# Patient Record
Sex: Female | Born: 1940 | Race: White | Hispanic: No | Marital: Single | Smoking: Never smoker
Health system: Southern US, Community
[De-identification: ages and names within clinical notes are randomized; demographics above are authoritative.]

## PROBLEM LIST (undated history)

## (undated) DIAGNOSIS — E785 Hyperlipidemia, unspecified: Secondary | ICD-10-CM

## (undated) DIAGNOSIS — E119 Type 2 diabetes mellitus without complications: Secondary | ICD-10-CM

## (undated) DIAGNOSIS — L97919 Non-pressure chronic ulcer of unspecified part of right lower leg with unspecified severity: Secondary | ICD-10-CM

## (undated) DIAGNOSIS — I739 Peripheral vascular disease, unspecified: Secondary | ICD-10-CM

## (undated) DIAGNOSIS — L97929 Non-pressure chronic ulcer of unspecified part of left lower leg with unspecified severity: Secondary | ICD-10-CM

## (undated) DIAGNOSIS — I10 Essential (primary) hypertension: Secondary | ICD-10-CM

## (undated) DIAGNOSIS — J449 Chronic obstructive pulmonary disease, unspecified: Secondary | ICD-10-CM

## (undated) HISTORY — DX: Type 2 diabetes mellitus without complications: E11.9

## (undated) HISTORY — DX: Non-pressure chronic ulcer of unspecified part of left lower leg with unspecified severity: L97.919

## (undated) HISTORY — DX: Non-pressure chronic ulcer of unspecified part of left lower leg with unspecified severity: L97.929

## (undated) HISTORY — DX: Chronic obstructive pulmonary disease, unspecified: J44.9

## (undated) HISTORY — DX: Hyperlipidemia, unspecified: E78.5

## (undated) HISTORY — DX: Essential (primary) hypertension: I10

## (undated) HISTORY — DX: Peripheral vascular disease, unspecified: I73.9

---

## 1991-12-06 HISTORY — PX: CHOLECYSTECTOMY: SHX55

## 2004-12-13 ENCOUNTER — Ambulatory Visit: Payer: Self-pay

## 2005-03-23 ENCOUNTER — Ambulatory Visit: Payer: Self-pay

## 2005-12-09 ENCOUNTER — Ambulatory Visit: Payer: Self-pay | Admitting: Internal Medicine

## 2006-07-31 ENCOUNTER — Ambulatory Visit: Payer: Self-pay | Admitting: Internal Medicine

## 2008-04-16 ENCOUNTER — Ambulatory Visit: Payer: Self-pay | Admitting: Family Medicine

## 2008-05-19 ENCOUNTER — Ambulatory Visit: Payer: Self-pay | Admitting: Internal Medicine

## 2008-11-19 ENCOUNTER — Ambulatory Visit: Payer: Self-pay | Admitting: Internal Medicine

## 2009-05-19 ENCOUNTER — Ambulatory Visit: Payer: Self-pay | Admitting: Ophthalmology

## 2009-06-02 ENCOUNTER — Ambulatory Visit: Payer: Self-pay | Admitting: Ophthalmology

## 2010-11-04 ENCOUNTER — Ambulatory Visit: Payer: Self-pay | Admitting: Family Medicine

## 2010-11-29 ENCOUNTER — Emergency Department: Payer: Self-pay | Admitting: Unknown Physician Specialty

## 2011-01-19 ENCOUNTER — Ambulatory Visit: Payer: Self-pay | Admitting: Internal Medicine

## 2011-10-13 ENCOUNTER — Ambulatory Visit: Payer: Self-pay | Admitting: Family Medicine

## 2012-01-13 DIAGNOSIS — I4891 Unspecified atrial fibrillation: Secondary | ICD-10-CM | POA: Diagnosis not present

## 2012-01-13 DIAGNOSIS — R0989 Other specified symptoms and signs involving the circulatory and respiratory systems: Secondary | ICD-10-CM | POA: Diagnosis not present

## 2012-01-13 DIAGNOSIS — R0609 Other forms of dyspnea: Secondary | ICD-10-CM | POA: Diagnosis not present

## 2012-01-16 DIAGNOSIS — E119 Type 2 diabetes mellitus without complications: Secondary | ICD-10-CM | POA: Diagnosis not present

## 2012-01-16 DIAGNOSIS — E785 Hyperlipidemia, unspecified: Secondary | ICD-10-CM | POA: Diagnosis not present

## 2012-01-16 DIAGNOSIS — I10 Essential (primary) hypertension: Secondary | ICD-10-CM | POA: Diagnosis not present

## 2012-01-18 DIAGNOSIS — R062 Wheezing: Secondary | ICD-10-CM | POA: Diagnosis not present

## 2012-01-18 DIAGNOSIS — J441 Chronic obstructive pulmonary disease with (acute) exacerbation: Secondary | ICD-10-CM | POA: Diagnosis not present

## 2012-01-18 DIAGNOSIS — R05 Cough: Secondary | ICD-10-CM | POA: Diagnosis not present

## 2012-02-01 DIAGNOSIS — J449 Chronic obstructive pulmonary disease, unspecified: Secondary | ICD-10-CM | POA: Diagnosis not present

## 2012-02-01 DIAGNOSIS — R05 Cough: Secondary | ICD-10-CM | POA: Diagnosis not present

## 2012-02-08 DIAGNOSIS — J449 Chronic obstructive pulmonary disease, unspecified: Secondary | ICD-10-CM | POA: Diagnosis not present

## 2012-02-08 DIAGNOSIS — I10 Essential (primary) hypertension: Secondary | ICD-10-CM | POA: Diagnosis not present

## 2012-02-08 DIAGNOSIS — E039 Hypothyroidism, unspecified: Secondary | ICD-10-CM | POA: Diagnosis not present

## 2012-02-08 DIAGNOSIS — E119 Type 2 diabetes mellitus without complications: Secondary | ICD-10-CM | POA: Diagnosis not present

## 2012-03-26 DIAGNOSIS — R05 Cough: Secondary | ICD-10-CM | POA: Diagnosis not present

## 2012-03-26 DIAGNOSIS — J449 Chronic obstructive pulmonary disease, unspecified: Secondary | ICD-10-CM | POA: Diagnosis not present

## 2012-05-09 DIAGNOSIS — R05 Cough: Secondary | ICD-10-CM | POA: Diagnosis not present

## 2012-05-09 DIAGNOSIS — J449 Chronic obstructive pulmonary disease, unspecified: Secondary | ICD-10-CM | POA: Diagnosis not present

## 2012-05-09 DIAGNOSIS — R0602 Shortness of breath: Secondary | ICD-10-CM | POA: Diagnosis not present

## 2012-05-09 DIAGNOSIS — J45902 Unspecified asthma with status asthmaticus: Secondary | ICD-10-CM | POA: Diagnosis not present

## 2012-05-24 DIAGNOSIS — M79609 Pain in unspecified limb: Secondary | ICD-10-CM | POA: Diagnosis not present

## 2012-05-24 DIAGNOSIS — B351 Tinea unguium: Secondary | ICD-10-CM | POA: Diagnosis not present

## 2012-05-25 DIAGNOSIS — J449 Chronic obstructive pulmonary disease, unspecified: Secondary | ICD-10-CM | POA: Diagnosis not present

## 2012-05-25 DIAGNOSIS — R0602 Shortness of breath: Secondary | ICD-10-CM | POA: Diagnosis not present

## 2012-05-25 DIAGNOSIS — R062 Wheezing: Secondary | ICD-10-CM | POA: Diagnosis not present

## 2012-06-11 DIAGNOSIS — J45909 Unspecified asthma, uncomplicated: Secondary | ICD-10-CM | POA: Diagnosis not present

## 2012-06-11 DIAGNOSIS — E039 Hypothyroidism, unspecified: Secondary | ICD-10-CM | POA: Diagnosis not present

## 2012-06-11 DIAGNOSIS — E7800 Pure hypercholesterolemia, unspecified: Secondary | ICD-10-CM | POA: Diagnosis not present

## 2012-06-18 DIAGNOSIS — E039 Hypothyroidism, unspecified: Secondary | ICD-10-CM | POA: Diagnosis not present

## 2012-06-18 DIAGNOSIS — E7800 Pure hypercholesterolemia, unspecified: Secondary | ICD-10-CM | POA: Diagnosis not present

## 2012-07-12 DIAGNOSIS — R0609 Other forms of dyspnea: Secondary | ICD-10-CM | POA: Diagnosis not present

## 2012-07-12 DIAGNOSIS — I4891 Unspecified atrial fibrillation: Secondary | ICD-10-CM | POA: Diagnosis not present

## 2012-07-12 DIAGNOSIS — R011 Cardiac murmur, unspecified: Secondary | ICD-10-CM | POA: Diagnosis not present

## 2012-07-24 DIAGNOSIS — R0602 Shortness of breath: Secondary | ICD-10-CM | POA: Diagnosis not present

## 2012-07-24 DIAGNOSIS — J449 Chronic obstructive pulmonary disease, unspecified: Secondary | ICD-10-CM | POA: Diagnosis not present

## 2012-07-24 DIAGNOSIS — I10 Essential (primary) hypertension: Secondary | ICD-10-CM | POA: Diagnosis not present

## 2012-07-24 DIAGNOSIS — I4891 Unspecified atrial fibrillation: Secondary | ICD-10-CM | POA: Diagnosis not present

## 2012-08-13 DIAGNOSIS — J449 Chronic obstructive pulmonary disease, unspecified: Secondary | ICD-10-CM | POA: Diagnosis not present

## 2012-08-13 DIAGNOSIS — I4891 Unspecified atrial fibrillation: Secondary | ICD-10-CM | POA: Diagnosis not present

## 2012-09-04 DIAGNOSIS — J441 Chronic obstructive pulmonary disease with (acute) exacerbation: Secondary | ICD-10-CM | POA: Diagnosis not present

## 2012-09-04 DIAGNOSIS — R0602 Shortness of breath: Secondary | ICD-10-CM | POA: Diagnosis not present

## 2012-09-04 DIAGNOSIS — J449 Chronic obstructive pulmonary disease, unspecified: Secondary | ICD-10-CM | POA: Diagnosis not present

## 2012-09-12 ENCOUNTER — Ambulatory Visit: Payer: Self-pay | Admitting: Physician Assistant

## 2012-09-12 DIAGNOSIS — R0989 Other specified symptoms and signs involving the circulatory and respiratory systems: Secondary | ICD-10-CM | POA: Diagnosis not present

## 2012-09-12 DIAGNOSIS — I517 Cardiomegaly: Secondary | ICD-10-CM | POA: Diagnosis not present

## 2012-09-12 DIAGNOSIS — R918 Other nonspecific abnormal finding of lung field: Secondary | ICD-10-CM | POA: Diagnosis not present

## 2012-09-12 DIAGNOSIS — J811 Chronic pulmonary edema: Secondary | ICD-10-CM | POA: Diagnosis not present

## 2012-09-14 DIAGNOSIS — Z23 Encounter for immunization: Secondary | ICD-10-CM | POA: Diagnosis not present

## 2012-09-14 DIAGNOSIS — R0602 Shortness of breath: Secondary | ICD-10-CM | POA: Diagnosis not present

## 2012-09-14 DIAGNOSIS — J449 Chronic obstructive pulmonary disease, unspecified: Secondary | ICD-10-CM | POA: Diagnosis not present

## 2012-09-14 DIAGNOSIS — I517 Cardiomegaly: Secondary | ICD-10-CM | POA: Diagnosis not present

## 2012-09-14 DIAGNOSIS — I4891 Unspecified atrial fibrillation: Secondary | ICD-10-CM | POA: Diagnosis not present

## 2012-09-24 DIAGNOSIS — R062 Wheezing: Secondary | ICD-10-CM | POA: Diagnosis not present

## 2012-09-24 DIAGNOSIS — I517 Cardiomegaly: Secondary | ICD-10-CM | POA: Diagnosis not present

## 2012-09-24 DIAGNOSIS — R05 Cough: Secondary | ICD-10-CM | POA: Diagnosis not present

## 2012-09-24 DIAGNOSIS — J441 Chronic obstructive pulmonary disease with (acute) exacerbation: Secondary | ICD-10-CM | POA: Diagnosis not present

## 2012-09-24 DIAGNOSIS — R0602 Shortness of breath: Secondary | ICD-10-CM | POA: Diagnosis not present

## 2012-10-08 ENCOUNTER — Ambulatory Visit: Payer: Self-pay | Admitting: Internal Medicine

## 2012-10-08 DIAGNOSIS — I4891 Unspecified atrial fibrillation: Secondary | ICD-10-CM | POA: Diagnosis not present

## 2012-10-08 DIAGNOSIS — W19XXXA Unspecified fall, initial encounter: Secondary | ICD-10-CM | POA: Diagnosis not present

## 2012-10-08 DIAGNOSIS — M25559 Pain in unspecified hip: Secondary | ICD-10-CM | POA: Diagnosis not present

## 2012-10-08 DIAGNOSIS — R0602 Shortness of breath: Secondary | ICD-10-CM | POA: Diagnosis not present

## 2012-10-08 DIAGNOSIS — M1610 Unilateral primary osteoarthritis, unspecified hip: Secondary | ICD-10-CM | POA: Diagnosis not present

## 2012-10-08 DIAGNOSIS — J449 Chronic obstructive pulmonary disease, unspecified: Secondary | ICD-10-CM | POA: Diagnosis not present

## 2012-10-08 DIAGNOSIS — N949 Unspecified condition associated with female genital organs and menstrual cycle: Secondary | ICD-10-CM | POA: Diagnosis not present

## 2012-10-08 DIAGNOSIS — M79609 Pain in unspecified limb: Secondary | ICD-10-CM | POA: Diagnosis not present

## 2012-10-24 DIAGNOSIS — R0602 Shortness of breath: Secondary | ICD-10-CM | POA: Diagnosis not present

## 2012-10-25 ENCOUNTER — Emergency Department: Payer: Self-pay | Admitting: Emergency Medicine

## 2012-10-25 DIAGNOSIS — T7840XA Allergy, unspecified, initial encounter: Secondary | ICD-10-CM | POA: Diagnosis not present

## 2012-10-25 DIAGNOSIS — Z8679 Personal history of other diseases of the circulatory system: Secondary | ICD-10-CM | POA: Diagnosis not present

## 2012-10-25 DIAGNOSIS — Z79899 Other long term (current) drug therapy: Secondary | ICD-10-CM | POA: Diagnosis not present

## 2012-10-25 DIAGNOSIS — E119 Type 2 diabetes mellitus without complications: Secondary | ICD-10-CM | POA: Diagnosis not present

## 2012-10-25 DIAGNOSIS — K219 Gastro-esophageal reflux disease without esophagitis: Secondary | ICD-10-CM | POA: Diagnosis not present

## 2012-10-25 DIAGNOSIS — R9431 Abnormal electrocardiogram [ECG] [EKG]: Secondary | ICD-10-CM | POA: Diagnosis not present

## 2012-10-25 DIAGNOSIS — T783XXA Angioneurotic edema, initial encounter: Secondary | ICD-10-CM | POA: Diagnosis not present

## 2012-10-25 DIAGNOSIS — E039 Hypothyroidism, unspecified: Secondary | ICD-10-CM | POA: Diagnosis not present

## 2012-10-25 DIAGNOSIS — Z7982 Long term (current) use of aspirin: Secondary | ICD-10-CM | POA: Diagnosis not present

## 2012-10-25 DIAGNOSIS — R0602 Shortness of breath: Secondary | ICD-10-CM | POA: Diagnosis not present

## 2012-10-25 DIAGNOSIS — Z794 Long term (current) use of insulin: Secondary | ICD-10-CM | POA: Diagnosis not present

## 2012-10-25 DIAGNOSIS — R05 Cough: Secondary | ICD-10-CM | POA: Diagnosis not present

## 2012-10-25 DIAGNOSIS — J449 Chronic obstructive pulmonary disease, unspecified: Secondary | ICD-10-CM | POA: Diagnosis not present

## 2012-10-25 DIAGNOSIS — R6889 Other general symptoms and signs: Secondary | ICD-10-CM | POA: Diagnosis not present

## 2012-10-25 LAB — CBC
HGB: 9.4 g/dL — ABNORMAL LOW (ref 12.0–16.0)
MCH: 23.8 pg — ABNORMAL LOW (ref 26.0–34.0)
MCHC: 31 g/dL — ABNORMAL LOW (ref 32.0–36.0)
Platelet: 297 10*3/uL (ref 150–440)
RBC: 3.96 10*6/uL (ref 3.80–5.20)
WBC: 8.3 10*3/uL (ref 3.6–11.0)

## 2012-10-25 LAB — URINALYSIS, COMPLETE
Bilirubin,UR: NEGATIVE
Glucose,UR: NEGATIVE mg/dL (ref 0–75)
Ketone: NEGATIVE
Ph: 5 (ref 4.5–8.0)
RBC,UR: NONE SEEN /HPF (ref 0–5)
Squamous Epithelial: 1
WBC UR: 3 /HPF (ref 0–5)

## 2012-10-25 LAB — TROPONIN I
Troponin-I: <0.02 ng/mL
Troponin-I: <0.02 ng/mL

## 2012-10-25 LAB — BASIC METABOLIC PANEL
Anion Gap: 4 — ABNORMAL LOW (ref 7–16)
BUN: 17 mg/dL (ref 7–18)
Glucose: 95 mg/dL (ref 65–99)
Osmolality: 279 (ref 275–301)
Potassium: 4.5 mmol/L (ref 3.5–5.1)
Sodium: 139 mmol/L (ref 136–145)

## 2012-10-29 DIAGNOSIS — J449 Chronic obstructive pulmonary disease, unspecified: Secondary | ICD-10-CM | POA: Diagnosis not present

## 2012-10-29 DIAGNOSIS — R6889 Other general symptoms and signs: Secondary | ICD-10-CM | POA: Diagnosis not present

## 2012-10-29 DIAGNOSIS — E119 Type 2 diabetes mellitus without complications: Secondary | ICD-10-CM | POA: Diagnosis not present

## 2012-10-29 DIAGNOSIS — D649 Anemia, unspecified: Secondary | ICD-10-CM | POA: Diagnosis not present

## 2012-10-31 DIAGNOSIS — J45909 Unspecified asthma, uncomplicated: Secondary | ICD-10-CM | POA: Diagnosis not present

## 2012-10-31 DIAGNOSIS — R269 Unspecified abnormalities of gait and mobility: Secondary | ICD-10-CM | POA: Diagnosis not present

## 2012-10-31 DIAGNOSIS — E119 Type 2 diabetes mellitus without complications: Secondary | ICD-10-CM | POA: Diagnosis not present

## 2012-10-31 DIAGNOSIS — M6281 Muscle weakness (generalized): Secondary | ICD-10-CM | POA: Diagnosis not present

## 2012-10-31 DIAGNOSIS — E7800 Pure hypercholesterolemia, unspecified: Secondary | ICD-10-CM | POA: Diagnosis not present

## 2012-10-31 DIAGNOSIS — J441 Chronic obstructive pulmonary disease with (acute) exacerbation: Secondary | ICD-10-CM | POA: Diagnosis not present

## 2012-11-05 DIAGNOSIS — M6281 Muscle weakness (generalized): Secondary | ICD-10-CM | POA: Diagnosis not present

## 2012-11-05 DIAGNOSIS — J441 Chronic obstructive pulmonary disease with (acute) exacerbation: Secondary | ICD-10-CM | POA: Diagnosis not present

## 2012-11-05 DIAGNOSIS — R269 Unspecified abnormalities of gait and mobility: Secondary | ICD-10-CM | POA: Diagnosis not present

## 2012-11-05 DIAGNOSIS — J45909 Unspecified asthma, uncomplicated: Secondary | ICD-10-CM | POA: Diagnosis not present

## 2012-11-05 DIAGNOSIS — E119 Type 2 diabetes mellitus without complications: Secondary | ICD-10-CM | POA: Diagnosis not present

## 2012-11-05 DIAGNOSIS — E7800 Pure hypercholesterolemia, unspecified: Secondary | ICD-10-CM | POA: Diagnosis not present

## 2012-11-06 DIAGNOSIS — E119 Type 2 diabetes mellitus without complications: Secondary | ICD-10-CM | POA: Diagnosis not present

## 2012-11-06 DIAGNOSIS — R269 Unspecified abnormalities of gait and mobility: Secondary | ICD-10-CM | POA: Diagnosis not present

## 2012-11-06 DIAGNOSIS — J45909 Unspecified asthma, uncomplicated: Secondary | ICD-10-CM | POA: Diagnosis not present

## 2012-11-06 DIAGNOSIS — D649 Anemia, unspecified: Secondary | ICD-10-CM | POA: Diagnosis not present

## 2012-11-06 DIAGNOSIS — J449 Chronic obstructive pulmonary disease, unspecified: Secondary | ICD-10-CM | POA: Diagnosis not present

## 2012-11-06 DIAGNOSIS — J441 Chronic obstructive pulmonary disease with (acute) exacerbation: Secondary | ICD-10-CM | POA: Diagnosis not present

## 2012-11-06 DIAGNOSIS — J438 Other emphysema: Secondary | ICD-10-CM | POA: Diagnosis not present

## 2012-11-06 DIAGNOSIS — M6281 Muscle weakness (generalized): Secondary | ICD-10-CM | POA: Diagnosis not present

## 2012-11-06 DIAGNOSIS — E7800 Pure hypercholesterolemia, unspecified: Secondary | ICD-10-CM | POA: Diagnosis not present

## 2012-11-08 DIAGNOSIS — J441 Chronic obstructive pulmonary disease with (acute) exacerbation: Secondary | ICD-10-CM | POA: Diagnosis not present

## 2012-11-08 DIAGNOSIS — J45909 Unspecified asthma, uncomplicated: Secondary | ICD-10-CM | POA: Diagnosis not present

## 2012-11-08 DIAGNOSIS — E7800 Pure hypercholesterolemia, unspecified: Secondary | ICD-10-CM | POA: Diagnosis not present

## 2012-11-08 DIAGNOSIS — M6281 Muscle weakness (generalized): Secondary | ICD-10-CM | POA: Diagnosis not present

## 2012-11-08 DIAGNOSIS — R269 Unspecified abnormalities of gait and mobility: Secondary | ICD-10-CM | POA: Diagnosis not present

## 2012-11-08 DIAGNOSIS — E119 Type 2 diabetes mellitus without complications: Secondary | ICD-10-CM | POA: Diagnosis not present

## 2012-11-12 DIAGNOSIS — E119 Type 2 diabetes mellitus without complications: Secondary | ICD-10-CM | POA: Diagnosis not present

## 2012-11-12 DIAGNOSIS — J45909 Unspecified asthma, uncomplicated: Secondary | ICD-10-CM | POA: Diagnosis not present

## 2012-11-12 DIAGNOSIS — J441 Chronic obstructive pulmonary disease with (acute) exacerbation: Secondary | ICD-10-CM | POA: Diagnosis not present

## 2012-11-12 DIAGNOSIS — R269 Unspecified abnormalities of gait and mobility: Secondary | ICD-10-CM | POA: Diagnosis not present

## 2012-11-12 DIAGNOSIS — M6281 Muscle weakness (generalized): Secondary | ICD-10-CM | POA: Diagnosis not present

## 2012-11-12 DIAGNOSIS — E7800 Pure hypercholesterolemia, unspecified: Secondary | ICD-10-CM | POA: Diagnosis not present

## 2012-11-13 DIAGNOSIS — M6281 Muscle weakness (generalized): Secondary | ICD-10-CM | POA: Diagnosis not present

## 2012-11-13 DIAGNOSIS — E7800 Pure hypercholesterolemia, unspecified: Secondary | ICD-10-CM | POA: Diagnosis not present

## 2012-11-13 DIAGNOSIS — E119 Type 2 diabetes mellitus without complications: Secondary | ICD-10-CM | POA: Diagnosis not present

## 2012-11-13 DIAGNOSIS — J441 Chronic obstructive pulmonary disease with (acute) exacerbation: Secondary | ICD-10-CM | POA: Diagnosis not present

## 2012-11-13 DIAGNOSIS — R269 Unspecified abnormalities of gait and mobility: Secondary | ICD-10-CM | POA: Diagnosis not present

## 2012-11-13 DIAGNOSIS — J45909 Unspecified asthma, uncomplicated: Secondary | ICD-10-CM | POA: Diagnosis not present

## 2012-11-14 DIAGNOSIS — E7800 Pure hypercholesterolemia, unspecified: Secondary | ICD-10-CM | POA: Diagnosis not present

## 2012-11-14 DIAGNOSIS — J441 Chronic obstructive pulmonary disease with (acute) exacerbation: Secondary | ICD-10-CM | POA: Diagnosis not present

## 2012-11-14 DIAGNOSIS — R269 Unspecified abnormalities of gait and mobility: Secondary | ICD-10-CM | POA: Diagnosis not present

## 2012-11-14 DIAGNOSIS — E119 Type 2 diabetes mellitus without complications: Secondary | ICD-10-CM | POA: Diagnosis not present

## 2012-11-14 DIAGNOSIS — J45909 Unspecified asthma, uncomplicated: Secondary | ICD-10-CM | POA: Diagnosis not present

## 2012-11-14 DIAGNOSIS — M6281 Muscle weakness (generalized): Secondary | ICD-10-CM | POA: Diagnosis not present

## 2012-11-20 DIAGNOSIS — M6281 Muscle weakness (generalized): Secondary | ICD-10-CM | POA: Diagnosis not present

## 2012-11-20 DIAGNOSIS — E119 Type 2 diabetes mellitus without complications: Secondary | ICD-10-CM | POA: Diagnosis not present

## 2012-11-20 DIAGNOSIS — J441 Chronic obstructive pulmonary disease with (acute) exacerbation: Secondary | ICD-10-CM | POA: Diagnosis not present

## 2012-11-20 DIAGNOSIS — J45909 Unspecified asthma, uncomplicated: Secondary | ICD-10-CM | POA: Diagnosis not present

## 2012-11-20 DIAGNOSIS — R269 Unspecified abnormalities of gait and mobility: Secondary | ICD-10-CM | POA: Diagnosis not present

## 2012-11-20 DIAGNOSIS — E7800 Pure hypercholesterolemia, unspecified: Secondary | ICD-10-CM | POA: Diagnosis not present

## 2012-11-22 DIAGNOSIS — J441 Chronic obstructive pulmonary disease with (acute) exacerbation: Secondary | ICD-10-CM | POA: Diagnosis not present

## 2012-11-22 DIAGNOSIS — E7800 Pure hypercholesterolemia, unspecified: Secondary | ICD-10-CM | POA: Diagnosis not present

## 2012-11-22 DIAGNOSIS — E119 Type 2 diabetes mellitus without complications: Secondary | ICD-10-CM | POA: Diagnosis not present

## 2012-11-22 DIAGNOSIS — M6281 Muscle weakness (generalized): Secondary | ICD-10-CM | POA: Diagnosis not present

## 2012-11-22 DIAGNOSIS — J45909 Unspecified asthma, uncomplicated: Secondary | ICD-10-CM | POA: Diagnosis not present

## 2012-11-22 DIAGNOSIS — R269 Unspecified abnormalities of gait and mobility: Secondary | ICD-10-CM | POA: Diagnosis not present

## 2012-11-23 DIAGNOSIS — J209 Acute bronchitis, unspecified: Secondary | ICD-10-CM | POA: Diagnosis not present

## 2012-11-23 DIAGNOSIS — J441 Chronic obstructive pulmonary disease with (acute) exacerbation: Secondary | ICD-10-CM | POA: Diagnosis not present

## 2012-11-23 DIAGNOSIS — H6120 Impacted cerumen, unspecified ear: Secondary | ICD-10-CM | POA: Diagnosis not present

## 2012-11-23 DIAGNOSIS — J438 Other emphysema: Secondary | ICD-10-CM | POA: Diagnosis not present

## 2012-12-11 DIAGNOSIS — E119 Type 2 diabetes mellitus without complications: Secondary | ICD-10-CM | POA: Diagnosis not present

## 2012-12-11 DIAGNOSIS — J45909 Unspecified asthma, uncomplicated: Secondary | ICD-10-CM | POA: Diagnosis not present

## 2012-12-11 DIAGNOSIS — R269 Unspecified abnormalities of gait and mobility: Secondary | ICD-10-CM | POA: Diagnosis not present

## 2012-12-11 DIAGNOSIS — E7800 Pure hypercholesterolemia, unspecified: Secondary | ICD-10-CM | POA: Diagnosis not present

## 2012-12-11 DIAGNOSIS — J441 Chronic obstructive pulmonary disease with (acute) exacerbation: Secondary | ICD-10-CM | POA: Diagnosis not present

## 2012-12-11 DIAGNOSIS — M6281 Muscle weakness (generalized): Secondary | ICD-10-CM | POA: Diagnosis not present

## 2012-12-24 DIAGNOSIS — J45909 Unspecified asthma, uncomplicated: Secondary | ICD-10-CM | POA: Diagnosis not present

## 2012-12-24 DIAGNOSIS — J441 Chronic obstructive pulmonary disease with (acute) exacerbation: Secondary | ICD-10-CM | POA: Diagnosis not present

## 2012-12-24 DIAGNOSIS — M6281 Muscle weakness (generalized): Secondary | ICD-10-CM | POA: Diagnosis not present

## 2012-12-24 DIAGNOSIS — R269 Unspecified abnormalities of gait and mobility: Secondary | ICD-10-CM | POA: Diagnosis not present

## 2012-12-27 DIAGNOSIS — J441 Chronic obstructive pulmonary disease with (acute) exacerbation: Secondary | ICD-10-CM | POA: Diagnosis not present

## 2012-12-27 DIAGNOSIS — E119 Type 2 diabetes mellitus without complications: Secondary | ICD-10-CM | POA: Diagnosis not present

## 2012-12-27 DIAGNOSIS — J45909 Unspecified asthma, uncomplicated: Secondary | ICD-10-CM | POA: Diagnosis not present

## 2012-12-27 DIAGNOSIS — M6281 Muscle weakness (generalized): Secondary | ICD-10-CM | POA: Diagnosis not present

## 2012-12-27 DIAGNOSIS — E7800 Pure hypercholesterolemia, unspecified: Secondary | ICD-10-CM | POA: Diagnosis not present

## 2012-12-27 DIAGNOSIS — R269 Unspecified abnormalities of gait and mobility: Secondary | ICD-10-CM | POA: Diagnosis not present

## 2012-12-31 DIAGNOSIS — E119 Type 2 diabetes mellitus without complications: Secondary | ICD-10-CM | POA: Diagnosis not present

## 2012-12-31 DIAGNOSIS — R1310 Dysphagia, unspecified: Secondary | ICD-10-CM | POA: Diagnosis not present

## 2012-12-31 DIAGNOSIS — R05 Cough: Secondary | ICD-10-CM | POA: Diagnosis not present

## 2012-12-31 DIAGNOSIS — J449 Chronic obstructive pulmonary disease, unspecified: Secondary | ICD-10-CM | POA: Diagnosis not present

## 2012-12-31 DIAGNOSIS — E039 Hypothyroidism, unspecified: Secondary | ICD-10-CM | POA: Diagnosis not present

## 2012-12-31 DIAGNOSIS — K219 Gastro-esophageal reflux disease without esophagitis: Secondary | ICD-10-CM | POA: Diagnosis not present

## 2012-12-31 DIAGNOSIS — J690 Pneumonitis due to inhalation of food and vomit: Secondary | ICD-10-CM | POA: Diagnosis not present

## 2013-01-21 ENCOUNTER — Ambulatory Visit: Payer: Self-pay | Admitting: Family Medicine

## 2013-01-21 DIAGNOSIS — R1310 Dysphagia, unspecified: Secondary | ICD-10-CM | POA: Diagnosis not present

## 2013-01-21 DIAGNOSIS — H2510 Age-related nuclear cataract, unspecified eye: Secondary | ICD-10-CM | POA: Diagnosis not present

## 2013-01-25 DIAGNOSIS — R0989 Other specified symptoms and signs involving the circulatory and respiratory systems: Secondary | ICD-10-CM | POA: Diagnosis not present

## 2013-01-25 DIAGNOSIS — I4891 Unspecified atrial fibrillation: Secondary | ICD-10-CM | POA: Diagnosis not present

## 2013-01-31 DIAGNOSIS — J209 Acute bronchitis, unspecified: Secondary | ICD-10-CM | POA: Diagnosis not present

## 2013-01-31 DIAGNOSIS — I10 Essential (primary) hypertension: Secondary | ICD-10-CM | POA: Diagnosis not present

## 2013-01-31 DIAGNOSIS — J45909 Unspecified asthma, uncomplicated: Secondary | ICD-10-CM | POA: Diagnosis not present

## 2013-01-31 DIAGNOSIS — J449 Chronic obstructive pulmonary disease, unspecified: Secondary | ICD-10-CM | POA: Diagnosis not present

## 2013-04-18 DIAGNOSIS — J449 Chronic obstructive pulmonary disease, unspecified: Secondary | ICD-10-CM | POA: Diagnosis not present

## 2013-04-18 DIAGNOSIS — I4891 Unspecified atrial fibrillation: Secondary | ICD-10-CM | POA: Diagnosis not present

## 2013-04-18 DIAGNOSIS — J45909 Unspecified asthma, uncomplicated: Secondary | ICD-10-CM | POA: Diagnosis not present

## 2013-04-18 DIAGNOSIS — I10 Essential (primary) hypertension: Secondary | ICD-10-CM | POA: Diagnosis not present

## 2013-05-22 DIAGNOSIS — R0602 Shortness of breath: Secondary | ICD-10-CM | POA: Diagnosis not present

## 2013-05-22 DIAGNOSIS — J441 Chronic obstructive pulmonary disease with (acute) exacerbation: Secondary | ICD-10-CM | POA: Diagnosis not present

## 2013-05-22 DIAGNOSIS — J438 Other emphysema: Secondary | ICD-10-CM | POA: Diagnosis not present

## 2013-07-12 DIAGNOSIS — R0602 Shortness of breath: Secondary | ICD-10-CM | POA: Diagnosis not present

## 2013-07-12 DIAGNOSIS — R5383 Other fatigue: Secondary | ICD-10-CM | POA: Diagnosis not present

## 2013-07-12 DIAGNOSIS — J449 Chronic obstructive pulmonary disease, unspecified: Secondary | ICD-10-CM | POA: Diagnosis not present

## 2013-07-12 DIAGNOSIS — R5381 Other malaise: Secondary | ICD-10-CM | POA: Diagnosis not present

## 2013-07-12 DIAGNOSIS — J069 Acute upper respiratory infection, unspecified: Secondary | ICD-10-CM | POA: Diagnosis not present

## 2013-07-30 DIAGNOSIS — J438 Other emphysema: Secondary | ICD-10-CM | POA: Diagnosis not present

## 2013-07-30 DIAGNOSIS — J449 Chronic obstructive pulmonary disease, unspecified: Secondary | ICD-10-CM | POA: Diagnosis not present

## 2013-08-12 DIAGNOSIS — R011 Cardiac murmur, unspecified: Secondary | ICD-10-CM | POA: Diagnosis not present

## 2013-08-12 DIAGNOSIS — E785 Hyperlipidemia, unspecified: Secondary | ICD-10-CM | POA: Diagnosis not present

## 2013-08-12 DIAGNOSIS — I4891 Unspecified atrial fibrillation: Secondary | ICD-10-CM | POA: Diagnosis not present

## 2013-08-12 DIAGNOSIS — R0609 Other forms of dyspnea: Secondary | ICD-10-CM | POA: Diagnosis not present

## 2013-08-12 DIAGNOSIS — I10 Essential (primary) hypertension: Secondary | ICD-10-CM | POA: Diagnosis not present

## 2013-08-12 DIAGNOSIS — R0602 Shortness of breath: Secondary | ICD-10-CM | POA: Diagnosis not present

## 2013-08-12 DIAGNOSIS — J449 Chronic obstructive pulmonary disease, unspecified: Secondary | ICD-10-CM | POA: Diagnosis not present

## 2013-08-12 DIAGNOSIS — E119 Type 2 diabetes mellitus without complications: Secondary | ICD-10-CM | POA: Diagnosis not present

## 2013-08-12 DIAGNOSIS — J438 Other emphysema: Secondary | ICD-10-CM | POA: Diagnosis not present

## 2013-08-15 ENCOUNTER — Emergency Department: Payer: Self-pay | Admitting: Emergency Medicine

## 2013-08-15 DIAGNOSIS — Z9981 Dependence on supplemental oxygen: Secondary | ICD-10-CM | POA: Diagnosis not present

## 2013-08-15 DIAGNOSIS — J189 Pneumonia, unspecified organism: Secondary | ICD-10-CM | POA: Diagnosis not present

## 2013-08-15 DIAGNOSIS — R0789 Other chest pain: Secondary | ICD-10-CM | POA: Diagnosis not present

## 2013-08-15 DIAGNOSIS — J441 Chronic obstructive pulmonary disease with (acute) exacerbation: Secondary | ICD-10-CM | POA: Diagnosis not present

## 2013-08-15 DIAGNOSIS — E039 Hypothyroidism, unspecified: Secondary | ICD-10-CM | POA: Diagnosis not present

## 2013-08-15 DIAGNOSIS — E119 Type 2 diabetes mellitus without complications: Secondary | ICD-10-CM | POA: Diagnosis not present

## 2013-08-15 DIAGNOSIS — I10 Essential (primary) hypertension: Secondary | ICD-10-CM | POA: Diagnosis not present

## 2013-08-15 DIAGNOSIS — R0602 Shortness of breath: Secondary | ICD-10-CM | POA: Diagnosis not present

## 2013-08-15 LAB — CBC
HCT: 32.9 % — ABNORMAL LOW (ref 35.0–47.0)
HGB: 10.9 g/dL — ABNORMAL LOW (ref 12.0–16.0)
MCH: 30.4 pg (ref 26.0–34.0)
MCHC: 33.2 g/dL (ref 32.0–36.0)
MCV: 92 fL (ref 80–100)
Platelet: 204 10*3/uL (ref 150–440)
RBC: 3.59 10*6/uL — ABNORMAL LOW (ref 3.80–5.20)
RDW: 13.5 % (ref 11.5–14.5)

## 2013-08-15 LAB — BASIC METABOLIC PANEL
BUN: 11 mg/dL (ref 7–18)
Calcium, Total: 8.2 mg/dL — ABNORMAL LOW (ref 8.5–10.1)
Chloride: 104 mmol/L (ref 98–107)
Co2: 32 mmol/L (ref 21–32)
Creatinine: 0.66 mg/dL (ref 0.60–1.30)
EGFR (African American): >60
Glucose: 208 mg/dL — ABNORMAL HIGH (ref 65–99)
Potassium: 4 mmol/L (ref 3.5–5.1)

## 2013-08-21 DIAGNOSIS — J438 Other emphysema: Secondary | ICD-10-CM | POA: Diagnosis not present

## 2013-08-21 DIAGNOSIS — R0602 Shortness of breath: Secondary | ICD-10-CM | POA: Diagnosis not present

## 2013-08-21 DIAGNOSIS — J441 Chronic obstructive pulmonary disease with (acute) exacerbation: Secondary | ICD-10-CM | POA: Diagnosis not present

## 2013-08-26 ENCOUNTER — Ambulatory Visit: Payer: Self-pay | Admitting: Internal Medicine

## 2013-08-26 DIAGNOSIS — R0602 Shortness of breath: Secondary | ICD-10-CM | POA: Diagnosis not present

## 2013-08-26 DIAGNOSIS — R011 Cardiac murmur, unspecified: Secondary | ICD-10-CM | POA: Diagnosis not present

## 2013-08-26 DIAGNOSIS — J189 Pneumonia, unspecified organism: Secondary | ICD-10-CM | POA: Diagnosis not present

## 2013-08-26 DIAGNOSIS — R599 Enlarged lymph nodes, unspecified: Secondary | ICD-10-CM | POA: Diagnosis not present

## 2013-09-12 DIAGNOSIS — Z23 Encounter for immunization: Secondary | ICD-10-CM | POA: Diagnosis not present

## 2013-10-17 DIAGNOSIS — R0602 Shortness of breath: Secondary | ICD-10-CM | POA: Diagnosis not present

## 2013-10-17 DIAGNOSIS — J449 Chronic obstructive pulmonary disease, unspecified: Secondary | ICD-10-CM | POA: Diagnosis not present

## 2014-02-03 DIAGNOSIS — R011 Cardiac murmur, unspecified: Secondary | ICD-10-CM | POA: Diagnosis not present

## 2014-02-03 DIAGNOSIS — R809 Proteinuria, unspecified: Secondary | ICD-10-CM | POA: Diagnosis not present

## 2014-02-03 DIAGNOSIS — I10 Essential (primary) hypertension: Secondary | ICD-10-CM | POA: Diagnosis not present

## 2014-02-03 DIAGNOSIS — R0609 Other forms of dyspnea: Secondary | ICD-10-CM | POA: Diagnosis not present

## 2014-02-03 DIAGNOSIS — R0989 Other specified symptoms and signs involving the circulatory and respiratory systems: Secondary | ICD-10-CM | POA: Diagnosis not present

## 2014-02-03 DIAGNOSIS — E785 Hyperlipidemia, unspecified: Secondary | ICD-10-CM | POA: Diagnosis not present

## 2014-02-03 DIAGNOSIS — E1129 Type 2 diabetes mellitus with other diabetic kidney complication: Secondary | ICD-10-CM | POA: Diagnosis not present

## 2014-02-10 DIAGNOSIS — E785 Hyperlipidemia, unspecified: Secondary | ICD-10-CM | POA: Diagnosis not present

## 2014-02-27 ENCOUNTER — Ambulatory Visit (INDEPENDENT_AMBULATORY_CARE_PROVIDER_SITE_OTHER): Payer: Medicare Other | Admitting: Podiatrist

## 2014-02-27 VITALS — BP 129/53 | HR 99 | Resp 16 | Ht 59.0 in | Wt 188.0 lb

## 2014-02-27 DIAGNOSIS — R0602 Shortness of breath: Secondary | ICD-10-CM | POA: Diagnosis not present

## 2014-02-27 DIAGNOSIS — B351 Tinea unguium: Secondary | ICD-10-CM | POA: Diagnosis not present

## 2014-02-27 DIAGNOSIS — J438 Other emphysema: Secondary | ICD-10-CM | POA: Diagnosis not present

## 2014-02-27 DIAGNOSIS — M79609 Pain in unspecified limb: Secondary | ICD-10-CM | POA: Diagnosis not present

## 2014-02-27 DIAGNOSIS — J449 Chronic obstructive pulmonary disease, unspecified: Secondary | ICD-10-CM | POA: Diagnosis not present

## 2014-02-27 NOTE — Progress Notes (Signed)
HPI:  Patient presents today for follow up of foot and nail care. Denies any new complaints today.  Objective:  Patients chart is reviewed.  Vascular status reveals pedal pulses noted at 1 out of 4 dp and pt bilateral .  Neurological sensation is Normal to Semmes Weinstein monofilament bilateral.  Patients nails are thickened, discolored, distrophic, friable and brittle with yellow-brown discoloration. Patient subjectively relates they are painful with shoes and with ambulation of bilateral feet.  Assessment:  Symptomatic onychomycosis  Plan:  Discussed treatment options and alternatives.  The symptomatic toenails were debrided through manual an mechanical means without complication.  Return appointment recommended at routine intervals of 3 months    Kathryn Egerton, DPM   

## 2014-02-27 NOTE — Progress Notes (Deleted)
   Subjective:    Patient ID: Karen [name], female    DOB: 07-Feb-1941, 73 y.o.   MRN: [id number]  HPI Comments: N o L trim toenails D yrs O slowly C same A growing out T dr egerton trims nails      Review of Systems     Objective:   Physical Exam        Assessment & Plan:

## 2014-03-27 ENCOUNTER — Ambulatory Visit: Payer: Self-pay | Admitting: Internal Medicine

## 2014-03-27 DIAGNOSIS — J984 Other disorders of lung: Secondary | ICD-10-CM | POA: Diagnosis not present

## 2014-03-27 DIAGNOSIS — J189 Pneumonia, unspecified organism: Secondary | ICD-10-CM | POA: Diagnosis not present

## 2014-04-16 IMAGING — CR DG CHEST 2V
1 series · 2 of 2 positions shown · non-contrast
Comparison: none

REASON FOR EXAM: sob
COMMENTS:

PROCEDURE:     DXR - DXR CHEST PA (OR AP) AND LATERAL  - October 25, 2012  [DATE]
RESULT:     Comparison: 09/12/2012

[Series 1: x chest ap · 0.14mm/px · 2 of 2 slices shown]
[im 1/2]
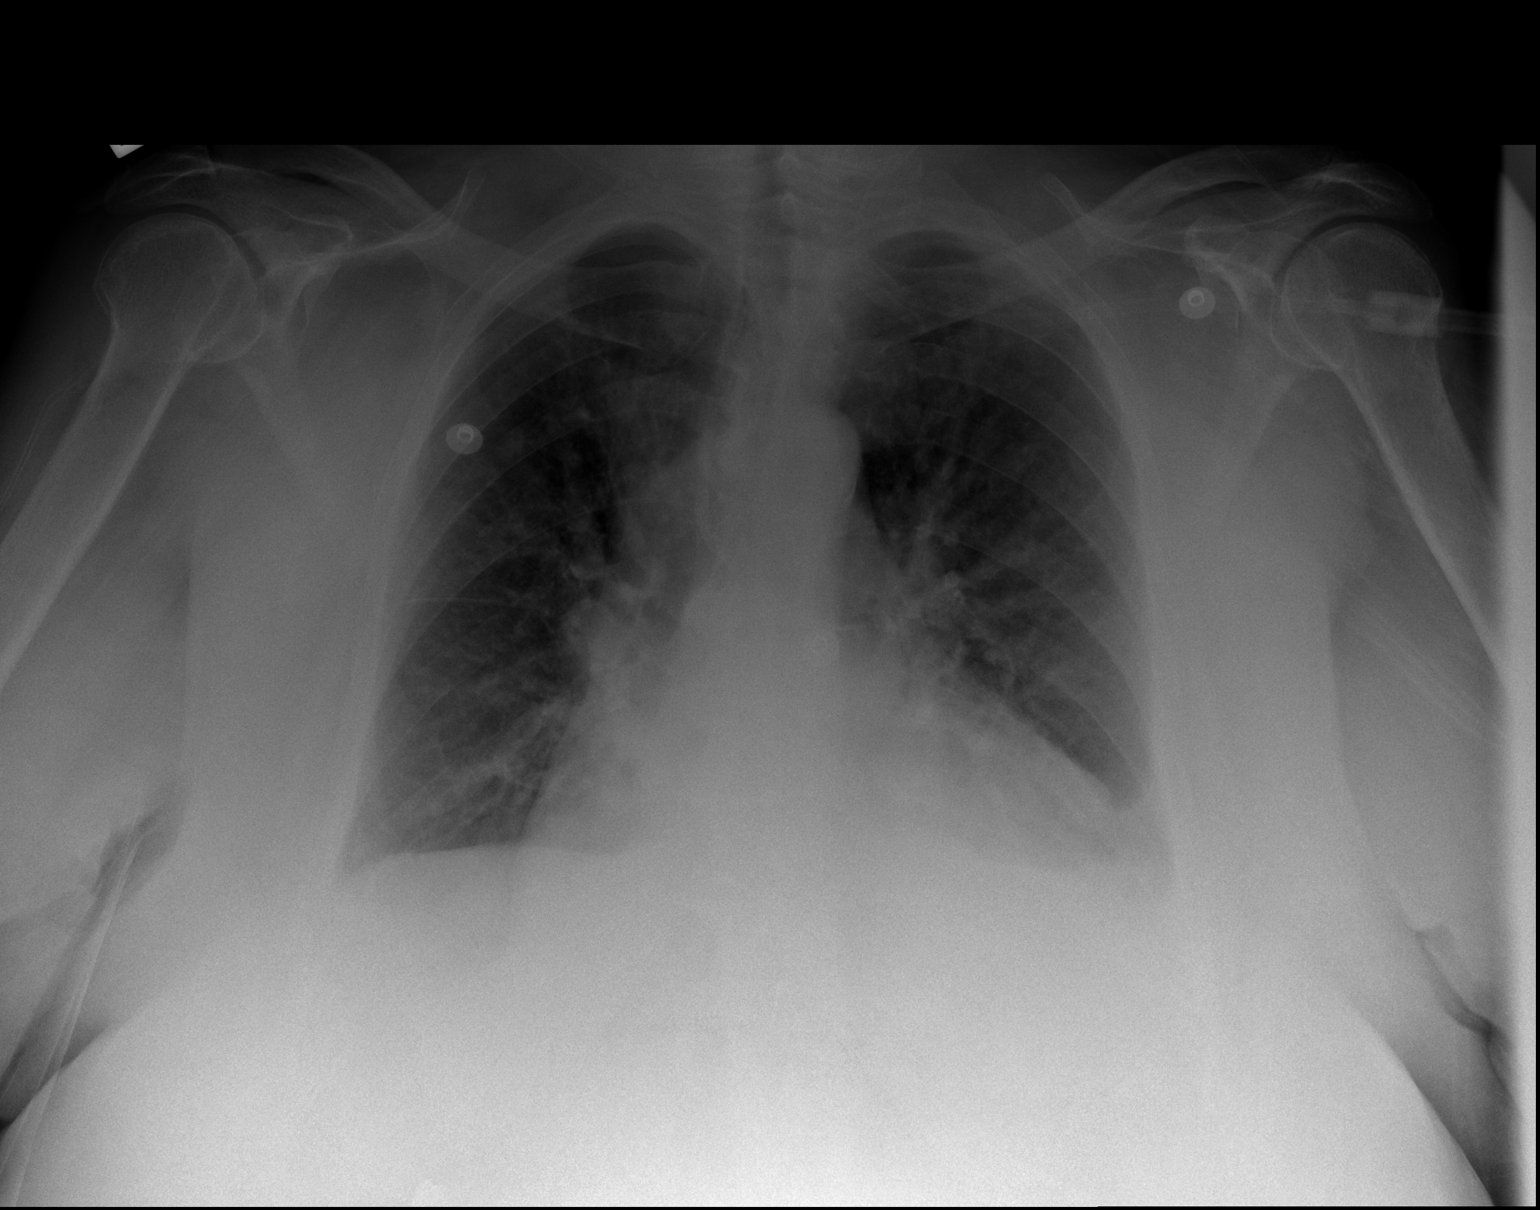
[im 2/2]
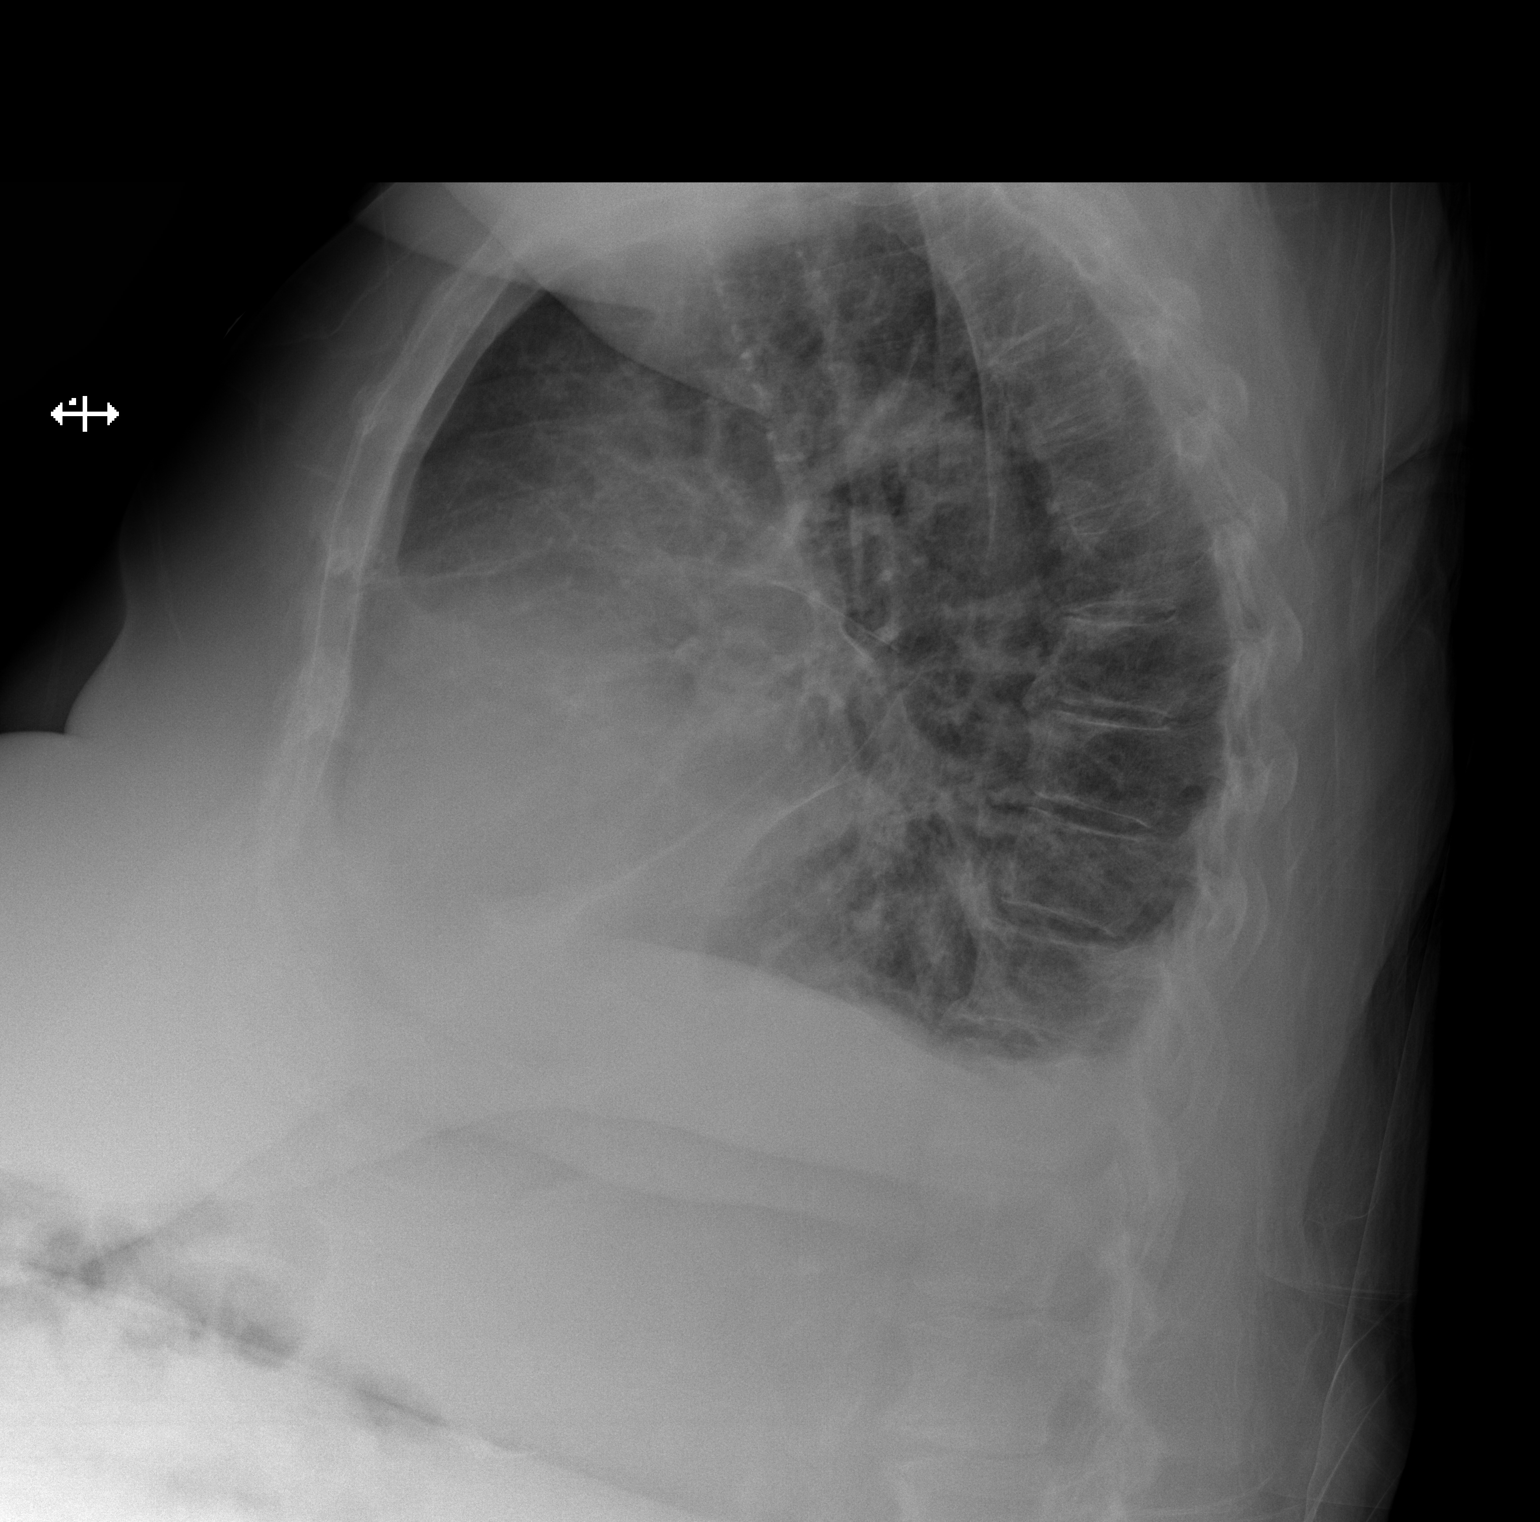

[2 of 2 positions shown; findings below may reference images not displayed]

FINDINGS: Cardiomegaly and the mediastinum are similar to prior. There is
cephalization of the pulmonary vasculature. Mild basilar opacities are
likely secondary to atelectasis. There are trace bilateral pleural effusions.
IMPRESSION: Findings of pulmonary venous hypertension, without frank pulmonary edema.

[REDACTED]

## 2014-05-05 DIAGNOSIS — R0602 Shortness of breath: Secondary | ICD-10-CM | POA: Diagnosis not present

## 2014-05-05 DIAGNOSIS — J449 Chronic obstructive pulmonary disease, unspecified: Secondary | ICD-10-CM | POA: Diagnosis not present

## 2014-05-05 DIAGNOSIS — J209 Acute bronchitis, unspecified: Secondary | ICD-10-CM | POA: Diagnosis not present

## 2014-05-08 DIAGNOSIS — J40 Bronchitis, not specified as acute or chronic: Secondary | ICD-10-CM | POA: Diagnosis not present

## 2014-05-08 DIAGNOSIS — J45909 Unspecified asthma, uncomplicated: Secondary | ICD-10-CM | POA: Diagnosis not present

## 2014-05-08 DIAGNOSIS — E119 Type 2 diabetes mellitus without complications: Secondary | ICD-10-CM | POA: Diagnosis not present

## 2014-05-08 DIAGNOSIS — IMO0001 Reserved for inherently not codable concepts without codable children: Secondary | ICD-10-CM | POA: Diagnosis not present

## 2014-06-09 DIAGNOSIS — J45909 Unspecified asthma, uncomplicated: Secondary | ICD-10-CM | POA: Diagnosis not present

## 2014-06-09 DIAGNOSIS — Z23 Encounter for immunization: Secondary | ICD-10-CM | POA: Diagnosis not present

## 2014-06-09 DIAGNOSIS — E1165 Type 2 diabetes mellitus with hyperglycemia: Secondary | ICD-10-CM | POA: Diagnosis not present

## 2014-06-09 DIAGNOSIS — E1129 Type 2 diabetes mellitus with other diabetic kidney complication: Secondary | ICD-10-CM | POA: Diagnosis not present

## 2014-06-09 DIAGNOSIS — I10 Essential (primary) hypertension: Secondary | ICD-10-CM | POA: Diagnosis not present

## 2014-06-09 DIAGNOSIS — E785 Hyperlipidemia, unspecified: Secondary | ICD-10-CM | POA: Diagnosis not present

## 2014-07-01 DIAGNOSIS — J438 Other emphysema: Secondary | ICD-10-CM | POA: Diagnosis not present

## 2014-07-01 DIAGNOSIS — R0602 Shortness of breath: Secondary | ICD-10-CM | POA: Diagnosis not present

## 2014-07-01 DIAGNOSIS — J441 Chronic obstructive pulmonary disease with (acute) exacerbation: Secondary | ICD-10-CM | POA: Diagnosis not present

## 2014-07-14 DIAGNOSIS — J45901 Unspecified asthma with (acute) exacerbation: Secondary | ICD-10-CM | POA: Diagnosis not present

## 2014-07-14 DIAGNOSIS — I10 Essential (primary) hypertension: Secondary | ICD-10-CM | POA: Diagnosis not present

## 2014-07-14 DIAGNOSIS — I2510 Atherosclerotic heart disease of native coronary artery without angina pectoris: Secondary | ICD-10-CM | POA: Diagnosis not present

## 2014-07-14 DIAGNOSIS — E119 Type 2 diabetes mellitus without complications: Secondary | ICD-10-CM | POA: Diagnosis not present

## 2014-07-14 DIAGNOSIS — J441 Chronic obstructive pulmonary disease with (acute) exacerbation: Secondary | ICD-10-CM | POA: Diagnosis not present

## 2014-07-14 DIAGNOSIS — Z794 Long term (current) use of insulin: Secondary | ICD-10-CM | POA: Diagnosis not present

## 2014-07-14 DIAGNOSIS — Z9981 Dependence on supplemental oxygen: Secondary | ICD-10-CM | POA: Diagnosis not present

## 2014-07-14 DIAGNOSIS — J9610 Chronic respiratory failure, unspecified whether with hypoxia or hypercapnia: Secondary | ICD-10-CM | POA: Diagnosis not present

## 2014-07-22 DIAGNOSIS — J441 Chronic obstructive pulmonary disease with (acute) exacerbation: Secondary | ICD-10-CM | POA: Diagnosis not present

## 2014-07-22 DIAGNOSIS — I10 Essential (primary) hypertension: Secondary | ICD-10-CM | POA: Diagnosis not present

## 2014-07-22 DIAGNOSIS — J9610 Chronic respiratory failure, unspecified whether with hypoxia or hypercapnia: Secondary | ICD-10-CM | POA: Diagnosis not present

## 2014-07-22 DIAGNOSIS — E119 Type 2 diabetes mellitus without complications: Secondary | ICD-10-CM | POA: Diagnosis not present

## 2014-07-22 DIAGNOSIS — J45901 Unspecified asthma with (acute) exacerbation: Secondary | ICD-10-CM | POA: Diagnosis not present

## 2014-07-22 DIAGNOSIS — I2510 Atherosclerotic heart disease of native coronary artery without angina pectoris: Secondary | ICD-10-CM | POA: Diagnosis not present

## 2014-07-22 DIAGNOSIS — Z9981 Dependence on supplemental oxygen: Secondary | ICD-10-CM | POA: Diagnosis not present

## 2014-07-28 DIAGNOSIS — J9610 Chronic respiratory failure, unspecified whether with hypoxia or hypercapnia: Secondary | ICD-10-CM | POA: Diagnosis not present

## 2014-07-28 DIAGNOSIS — E119 Type 2 diabetes mellitus without complications: Secondary | ICD-10-CM | POA: Diagnosis not present

## 2014-07-28 DIAGNOSIS — J441 Chronic obstructive pulmonary disease with (acute) exacerbation: Secondary | ICD-10-CM | POA: Diagnosis not present

## 2014-07-28 DIAGNOSIS — Z9981 Dependence on supplemental oxygen: Secondary | ICD-10-CM | POA: Diagnosis not present

## 2014-07-28 DIAGNOSIS — I10 Essential (primary) hypertension: Secondary | ICD-10-CM | POA: Diagnosis not present

## 2014-07-28 DIAGNOSIS — I2510 Atherosclerotic heart disease of native coronary artery without angina pectoris: Secondary | ICD-10-CM | POA: Diagnosis not present

## 2014-07-29 DIAGNOSIS — J449 Chronic obstructive pulmonary disease, unspecified: Secondary | ICD-10-CM | POA: Diagnosis not present

## 2014-07-29 DIAGNOSIS — E119 Type 2 diabetes mellitus without complications: Secondary | ICD-10-CM | POA: Diagnosis not present

## 2014-07-29 DIAGNOSIS — R0602 Shortness of breath: Secondary | ICD-10-CM | POA: Diagnosis not present

## 2014-08-04 DIAGNOSIS — J9610 Chronic respiratory failure, unspecified whether with hypoxia or hypercapnia: Secondary | ICD-10-CM | POA: Diagnosis not present

## 2014-08-04 DIAGNOSIS — Z9981 Dependence on supplemental oxygen: Secondary | ICD-10-CM | POA: Diagnosis not present

## 2014-08-04 DIAGNOSIS — I2510 Atherosclerotic heart disease of native coronary artery without angina pectoris: Secondary | ICD-10-CM | POA: Diagnosis not present

## 2014-08-04 DIAGNOSIS — I10 Essential (primary) hypertension: Secondary | ICD-10-CM | POA: Diagnosis not present

## 2014-08-04 DIAGNOSIS — E119 Type 2 diabetes mellitus without complications: Secondary | ICD-10-CM | POA: Diagnosis not present

## 2014-08-04 DIAGNOSIS — J441 Chronic obstructive pulmonary disease with (acute) exacerbation: Secondary | ICD-10-CM | POA: Diagnosis not present

## 2014-08-13 DIAGNOSIS — J449 Chronic obstructive pulmonary disease, unspecified: Secondary | ICD-10-CM | POA: Diagnosis not present

## 2014-08-13 DIAGNOSIS — E669 Obesity, unspecified: Secondary | ICD-10-CM | POA: Diagnosis not present

## 2014-08-13 DIAGNOSIS — R0902 Hypoxemia: Secondary | ICD-10-CM | POA: Diagnosis not present

## 2014-08-13 DIAGNOSIS — E119 Type 2 diabetes mellitus without complications: Secondary | ICD-10-CM | POA: Diagnosis not present

## 2014-08-18 DIAGNOSIS — J45901 Unspecified asthma with (acute) exacerbation: Secondary | ICD-10-CM | POA: Diagnosis not present

## 2014-08-18 DIAGNOSIS — E119 Type 2 diabetes mellitus without complications: Secondary | ICD-10-CM | POA: Diagnosis not present

## 2014-08-18 DIAGNOSIS — J441 Chronic obstructive pulmonary disease with (acute) exacerbation: Secondary | ICD-10-CM | POA: Diagnosis not present

## 2014-08-18 DIAGNOSIS — I2510 Atherosclerotic heart disease of native coronary artery without angina pectoris: Secondary | ICD-10-CM | POA: Diagnosis not present

## 2014-08-18 DIAGNOSIS — J9610 Chronic respiratory failure, unspecified whether with hypoxia or hypercapnia: Secondary | ICD-10-CM | POA: Diagnosis not present

## 2014-08-18 DIAGNOSIS — I10 Essential (primary) hypertension: Secondary | ICD-10-CM | POA: Diagnosis not present

## 2014-08-18 DIAGNOSIS — Z9981 Dependence on supplemental oxygen: Secondary | ICD-10-CM | POA: Diagnosis not present

## 2014-10-10 DIAGNOSIS — Z23 Encounter for immunization: Secondary | ICD-10-CM | POA: Diagnosis not present

## 2014-10-13 DIAGNOSIS — E780 Pure hypercholesterolemia: Secondary | ICD-10-CM | POA: Diagnosis not present

## 2014-10-13 DIAGNOSIS — J45909 Unspecified asthma, uncomplicated: Secondary | ICD-10-CM | POA: Diagnosis not present

## 2014-10-13 DIAGNOSIS — E119 Type 2 diabetes mellitus without complications: Secondary | ICD-10-CM | POA: Diagnosis not present

## 2014-10-13 DIAGNOSIS — E039 Hypothyroidism, unspecified: Secondary | ICD-10-CM | POA: Diagnosis not present

## 2014-10-13 DIAGNOSIS — E669 Obesity, unspecified: Secondary | ICD-10-CM | POA: Diagnosis not present

## 2014-10-13 DIAGNOSIS — E1165 Type 2 diabetes mellitus with hyperglycemia: Secondary | ICD-10-CM | POA: Diagnosis not present

## 2014-10-28 DIAGNOSIS — J440 Chronic obstructive pulmonary disease with acute lower respiratory infection: Secondary | ICD-10-CM | POA: Diagnosis not present

## 2014-10-28 DIAGNOSIS — E1165 Type 2 diabetes mellitus with hyperglycemia: Secondary | ICD-10-CM | POA: Diagnosis not present

## 2014-10-28 DIAGNOSIS — J441 Chronic obstructive pulmonary disease with (acute) exacerbation: Secondary | ICD-10-CM | POA: Diagnosis not present

## 2014-10-28 DIAGNOSIS — R0602 Shortness of breath: Secondary | ICD-10-CM | POA: Diagnosis not present

## 2014-10-28 DIAGNOSIS — J449 Chronic obstructive pulmonary disease, unspecified: Secondary | ICD-10-CM | POA: Diagnosis not present

## 2014-11-06 ENCOUNTER — Ambulatory Visit: Payer: Self-pay | Admitting: Internal Medicine

## 2014-11-06 DIAGNOSIS — R05 Cough: Secondary | ICD-10-CM | POA: Diagnosis not present

## 2014-11-06 DIAGNOSIS — Z23 Encounter for immunization: Secondary | ICD-10-CM | POA: Diagnosis not present

## 2014-11-06 DIAGNOSIS — J440 Chronic obstructive pulmonary disease with acute lower respiratory infection: Secondary | ICD-10-CM | POA: Diagnosis not present

## 2014-11-06 DIAGNOSIS — R0602 Shortness of breath: Secondary | ICD-10-CM | POA: Diagnosis not present

## 2014-11-06 DIAGNOSIS — S82401A Unspecified fracture of shaft of right fibula, initial encounter for closed fracture: Secondary | ICD-10-CM | POA: Diagnosis not present

## 2014-11-06 DIAGNOSIS — S89301A Unspecified physeal fracture of lower end of right fibula, initial encounter for closed fracture: Secondary | ICD-10-CM | POA: Diagnosis not present

## 2014-11-06 DIAGNOSIS — M25571 Pain in right ankle and joints of right foot: Secondary | ICD-10-CM | POA: Diagnosis not present

## 2014-11-06 DIAGNOSIS — J441 Chronic obstructive pulmonary disease with (acute) exacerbation: Secondary | ICD-10-CM | POA: Diagnosis not present

## 2014-11-06 DIAGNOSIS — J449 Chronic obstructive pulmonary disease, unspecified: Secondary | ICD-10-CM | POA: Diagnosis not present

## 2014-11-07 ENCOUNTER — Emergency Department: Payer: Self-pay | Admitting: Student

## 2014-11-07 DIAGNOSIS — M25551 Pain in right hip: Secondary | ICD-10-CM | POA: Diagnosis not present

## 2014-11-07 DIAGNOSIS — S93491D Sprain of other ligament of right ankle, subsequent encounter: Secondary | ICD-10-CM | POA: Diagnosis not present

## 2014-11-07 DIAGNOSIS — E119 Type 2 diabetes mellitus without complications: Secondary | ICD-10-CM | POA: Diagnosis not present

## 2014-11-07 DIAGNOSIS — S8261XD Displaced fracture of lateral malleolus of right fibula, subsequent encounter for closed fracture with routine healing: Secondary | ICD-10-CM | POA: Diagnosis not present

## 2014-11-07 DIAGNOSIS — S7002XA Contusion of left hip, initial encounter: Secondary | ICD-10-CM | POA: Diagnosis not present

## 2014-11-07 DIAGNOSIS — S40012D Contusion of left shoulder, subsequent encounter: Secondary | ICD-10-CM | POA: Diagnosis not present

## 2014-11-07 DIAGNOSIS — S300XXA Contusion of lower back and pelvis, initial encounter: Secondary | ICD-10-CM | POA: Diagnosis not present

## 2014-11-07 DIAGNOSIS — I10 Essential (primary) hypertension: Secondary | ICD-10-CM | POA: Diagnosis not present

## 2014-11-07 DIAGNOSIS — Z880 Allergy status to penicillin: Secondary | ICD-10-CM | POA: Diagnosis not present

## 2014-11-07 DIAGNOSIS — S40012A Contusion of left shoulder, initial encounter: Secondary | ICD-10-CM | POA: Diagnosis not present

## 2014-11-07 DIAGNOSIS — S93401D Sprain of unspecified ligament of right ankle, subsequent encounter: Secondary | ICD-10-CM | POA: Diagnosis not present

## 2014-11-07 DIAGNOSIS — S3993XA Unspecified injury of pelvis, initial encounter: Secondary | ICD-10-CM | POA: Diagnosis not present

## 2014-11-07 DIAGNOSIS — S79911A Unspecified injury of right hip, initial encounter: Secondary | ICD-10-CM | POA: Diagnosis not present

## 2014-11-17 DIAGNOSIS — S93491A Sprain of other ligament of right ankle, initial encounter: Secondary | ICD-10-CM | POA: Diagnosis not present

## 2014-11-18 DIAGNOSIS — Z9181 History of falling: Secondary | ICD-10-CM | POA: Diagnosis not present

## 2014-11-18 DIAGNOSIS — I2510 Atherosclerotic heart disease of native coronary artery without angina pectoris: Secondary | ICD-10-CM | POA: Diagnosis not present

## 2014-11-18 DIAGNOSIS — J441 Chronic obstructive pulmonary disease with (acute) exacerbation: Secondary | ICD-10-CM | POA: Diagnosis not present

## 2014-11-18 DIAGNOSIS — M810 Age-related osteoporosis without current pathological fracture: Secondary | ICD-10-CM | POA: Diagnosis not present

## 2014-11-18 DIAGNOSIS — E119 Type 2 diabetes mellitus without complications: Secondary | ICD-10-CM | POA: Diagnosis not present

## 2014-11-18 DIAGNOSIS — I4891 Unspecified atrial fibrillation: Secondary | ICD-10-CM | POA: Diagnosis not present

## 2014-11-18 DIAGNOSIS — Z794 Long term (current) use of insulin: Secondary | ICD-10-CM | POA: Diagnosis not present

## 2014-11-18 DIAGNOSIS — I10 Essential (primary) hypertension: Secondary | ICD-10-CM | POA: Diagnosis not present

## 2014-11-18 DIAGNOSIS — S93401D Sprain of unspecified ligament of right ankle, subsequent encounter: Secondary | ICD-10-CM | POA: Diagnosis not present

## 2014-11-18 DIAGNOSIS — S82891D Other fracture of right lower leg, subsequent encounter for closed fracture with routine healing: Secondary | ICD-10-CM | POA: Diagnosis not present

## 2014-11-18 DIAGNOSIS — Z9981 Dependence on supplemental oxygen: Secondary | ICD-10-CM | POA: Diagnosis not present

## 2014-11-20 DIAGNOSIS — S93401D Sprain of unspecified ligament of right ankle, subsequent encounter: Secondary | ICD-10-CM | POA: Diagnosis not present

## 2014-11-20 DIAGNOSIS — I2510 Atherosclerotic heart disease of native coronary artery without angina pectoris: Secondary | ICD-10-CM | POA: Diagnosis not present

## 2014-11-20 DIAGNOSIS — E119 Type 2 diabetes mellitus without complications: Secondary | ICD-10-CM | POA: Diagnosis not present

## 2014-11-20 DIAGNOSIS — I10 Essential (primary) hypertension: Secondary | ICD-10-CM | POA: Diagnosis not present

## 2014-11-20 DIAGNOSIS — S82891D Other fracture of right lower leg, subsequent encounter for closed fracture with routine healing: Secondary | ICD-10-CM | POA: Diagnosis not present

## 2014-11-20 DIAGNOSIS — J441 Chronic obstructive pulmonary disease with (acute) exacerbation: Secondary | ICD-10-CM | POA: Diagnosis not present

## 2014-11-24 DIAGNOSIS — I10 Essential (primary) hypertension: Secondary | ICD-10-CM | POA: Diagnosis not present

## 2014-11-24 DIAGNOSIS — S93401D Sprain of unspecified ligament of right ankle, subsequent encounter: Secondary | ICD-10-CM | POA: Diagnosis not present

## 2014-11-24 DIAGNOSIS — E119 Type 2 diabetes mellitus without complications: Secondary | ICD-10-CM | POA: Diagnosis not present

## 2014-11-24 DIAGNOSIS — J441 Chronic obstructive pulmonary disease with (acute) exacerbation: Secondary | ICD-10-CM | POA: Diagnosis not present

## 2014-11-24 DIAGNOSIS — S82891D Other fracture of right lower leg, subsequent encounter for closed fracture with routine healing: Secondary | ICD-10-CM | POA: Diagnosis not present

## 2014-11-24 DIAGNOSIS — I2510 Atherosclerotic heart disease of native coronary artery without angina pectoris: Secondary | ICD-10-CM | POA: Diagnosis not present

## 2014-11-25 DIAGNOSIS — I10 Essential (primary) hypertension: Secondary | ICD-10-CM | POA: Diagnosis not present

## 2014-11-25 DIAGNOSIS — S82891D Other fracture of right lower leg, subsequent encounter for closed fracture with routine healing: Secondary | ICD-10-CM | POA: Diagnosis not present

## 2014-11-25 DIAGNOSIS — E119 Type 2 diabetes mellitus without complications: Secondary | ICD-10-CM | POA: Diagnosis not present

## 2014-11-25 DIAGNOSIS — S93401D Sprain of unspecified ligament of right ankle, subsequent encounter: Secondary | ICD-10-CM | POA: Diagnosis not present

## 2014-11-25 DIAGNOSIS — I2510 Atherosclerotic heart disease of native coronary artery without angina pectoris: Secondary | ICD-10-CM | POA: Diagnosis not present

## 2014-11-25 DIAGNOSIS — J441 Chronic obstructive pulmonary disease with (acute) exacerbation: Secondary | ICD-10-CM | POA: Diagnosis not present

## 2014-11-27 DIAGNOSIS — I10 Essential (primary) hypertension: Secondary | ICD-10-CM | POA: Diagnosis not present

## 2014-11-27 DIAGNOSIS — I2510 Atherosclerotic heart disease of native coronary artery without angina pectoris: Secondary | ICD-10-CM | POA: Diagnosis not present

## 2014-11-27 DIAGNOSIS — S82891D Other fracture of right lower leg, subsequent encounter for closed fracture with routine healing: Secondary | ICD-10-CM | POA: Diagnosis not present

## 2014-11-27 DIAGNOSIS — E119 Type 2 diabetes mellitus without complications: Secondary | ICD-10-CM | POA: Diagnosis not present

## 2014-11-27 DIAGNOSIS — S93401D Sprain of unspecified ligament of right ankle, subsequent encounter: Secondary | ICD-10-CM | POA: Diagnosis not present

## 2014-11-27 DIAGNOSIS — J441 Chronic obstructive pulmonary disease with (acute) exacerbation: Secondary | ICD-10-CM | POA: Diagnosis not present

## 2014-12-01 DIAGNOSIS — J441 Chronic obstructive pulmonary disease with (acute) exacerbation: Secondary | ICD-10-CM | POA: Diagnosis not present

## 2014-12-01 DIAGNOSIS — E119 Type 2 diabetes mellitus without complications: Secondary | ICD-10-CM | POA: Diagnosis not present

## 2014-12-01 DIAGNOSIS — S93401D Sprain of unspecified ligament of right ankle, subsequent encounter: Secondary | ICD-10-CM | POA: Diagnosis not present

## 2014-12-01 DIAGNOSIS — I10 Essential (primary) hypertension: Secondary | ICD-10-CM | POA: Diagnosis not present

## 2014-12-01 DIAGNOSIS — I2510 Atherosclerotic heart disease of native coronary artery without angina pectoris: Secondary | ICD-10-CM | POA: Diagnosis not present

## 2014-12-01 DIAGNOSIS — S82891D Other fracture of right lower leg, subsequent encounter for closed fracture with routine healing: Secondary | ICD-10-CM | POA: Diagnosis not present

## 2014-12-02 DIAGNOSIS — I10 Essential (primary) hypertension: Secondary | ICD-10-CM | POA: Diagnosis not present

## 2014-12-02 DIAGNOSIS — S93401D Sprain of unspecified ligament of right ankle, subsequent encounter: Secondary | ICD-10-CM | POA: Diagnosis not present

## 2014-12-02 DIAGNOSIS — E119 Type 2 diabetes mellitus without complications: Secondary | ICD-10-CM | POA: Diagnosis not present

## 2014-12-02 DIAGNOSIS — S82891D Other fracture of right lower leg, subsequent encounter for closed fracture with routine healing: Secondary | ICD-10-CM | POA: Diagnosis not present

## 2014-12-02 DIAGNOSIS — I2510 Atherosclerotic heart disease of native coronary artery without angina pectoris: Secondary | ICD-10-CM | POA: Diagnosis not present

## 2014-12-02 DIAGNOSIS — J441 Chronic obstructive pulmonary disease with (acute) exacerbation: Secondary | ICD-10-CM | POA: Diagnosis not present

## 2014-12-03 DIAGNOSIS — S93401D Sprain of unspecified ligament of right ankle, subsequent encounter: Secondary | ICD-10-CM | POA: Diagnosis not present

## 2014-12-03 DIAGNOSIS — I10 Essential (primary) hypertension: Secondary | ICD-10-CM | POA: Diagnosis not present

## 2014-12-03 DIAGNOSIS — J441 Chronic obstructive pulmonary disease with (acute) exacerbation: Secondary | ICD-10-CM | POA: Diagnosis not present

## 2014-12-03 DIAGNOSIS — E119 Type 2 diabetes mellitus without complications: Secondary | ICD-10-CM | POA: Diagnosis not present

## 2014-12-03 DIAGNOSIS — I2510 Atherosclerotic heart disease of native coronary artery without angina pectoris: Secondary | ICD-10-CM | POA: Diagnosis not present

## 2014-12-03 DIAGNOSIS — S82891D Other fracture of right lower leg, subsequent encounter for closed fracture with routine healing: Secondary | ICD-10-CM | POA: Diagnosis not present

## 2014-12-04 DIAGNOSIS — I10 Essential (primary) hypertension: Secondary | ICD-10-CM | POA: Diagnosis not present

## 2014-12-04 DIAGNOSIS — E119 Type 2 diabetes mellitus without complications: Secondary | ICD-10-CM | POA: Diagnosis not present

## 2014-12-04 DIAGNOSIS — I2510 Atherosclerotic heart disease of native coronary artery without angina pectoris: Secondary | ICD-10-CM | POA: Diagnosis not present

## 2014-12-04 DIAGNOSIS — J441 Chronic obstructive pulmonary disease with (acute) exacerbation: Secondary | ICD-10-CM | POA: Diagnosis not present

## 2014-12-04 DIAGNOSIS — S82891D Other fracture of right lower leg, subsequent encounter for closed fracture with routine healing: Secondary | ICD-10-CM | POA: Diagnosis not present

## 2014-12-04 DIAGNOSIS — S93401D Sprain of unspecified ligament of right ankle, subsequent encounter: Secondary | ICD-10-CM | POA: Diagnosis not present

## 2014-12-08 DIAGNOSIS — J441 Chronic obstructive pulmonary disease with (acute) exacerbation: Secondary | ICD-10-CM | POA: Diagnosis not present

## 2014-12-08 DIAGNOSIS — E119 Type 2 diabetes mellitus without complications: Secondary | ICD-10-CM | POA: Diagnosis not present

## 2014-12-08 DIAGNOSIS — S82891D Other fracture of right lower leg, subsequent encounter for closed fracture with routine healing: Secondary | ICD-10-CM | POA: Diagnosis not present

## 2014-12-08 DIAGNOSIS — I10 Essential (primary) hypertension: Secondary | ICD-10-CM | POA: Diagnosis not present

## 2014-12-08 DIAGNOSIS — S93401D Sprain of unspecified ligament of right ankle, subsequent encounter: Secondary | ICD-10-CM | POA: Diagnosis not present

## 2014-12-08 DIAGNOSIS — I2510 Atherosclerotic heart disease of native coronary artery without angina pectoris: Secondary | ICD-10-CM | POA: Diagnosis not present

## 2014-12-09 DIAGNOSIS — S82891D Other fracture of right lower leg, subsequent encounter for closed fracture with routine healing: Secondary | ICD-10-CM | POA: Diagnosis not present

## 2014-12-09 DIAGNOSIS — I10 Essential (primary) hypertension: Secondary | ICD-10-CM | POA: Diagnosis not present

## 2014-12-09 DIAGNOSIS — S93401D Sprain of unspecified ligament of right ankle, subsequent encounter: Secondary | ICD-10-CM | POA: Diagnosis not present

## 2014-12-09 DIAGNOSIS — J441 Chronic obstructive pulmonary disease with (acute) exacerbation: Secondary | ICD-10-CM | POA: Diagnosis not present

## 2014-12-09 DIAGNOSIS — E119 Type 2 diabetes mellitus without complications: Secondary | ICD-10-CM | POA: Diagnosis not present

## 2014-12-09 DIAGNOSIS — I2510 Atherosclerotic heart disease of native coronary artery without angina pectoris: Secondary | ICD-10-CM | POA: Diagnosis not present

## 2014-12-10 DIAGNOSIS — E119 Type 2 diabetes mellitus without complications: Secondary | ICD-10-CM | POA: Diagnosis not present

## 2014-12-10 DIAGNOSIS — I10 Essential (primary) hypertension: Secondary | ICD-10-CM | POA: Diagnosis not present

## 2014-12-10 DIAGNOSIS — S82891D Other fracture of right lower leg, subsequent encounter for closed fracture with routine healing: Secondary | ICD-10-CM | POA: Diagnosis not present

## 2014-12-10 DIAGNOSIS — I2510 Atherosclerotic heart disease of native coronary artery without angina pectoris: Secondary | ICD-10-CM | POA: Diagnosis not present

## 2014-12-10 DIAGNOSIS — J441 Chronic obstructive pulmonary disease with (acute) exacerbation: Secondary | ICD-10-CM | POA: Diagnosis not present

## 2014-12-10 DIAGNOSIS — S93401D Sprain of unspecified ligament of right ankle, subsequent encounter: Secondary | ICD-10-CM | POA: Diagnosis not present

## 2014-12-11 DIAGNOSIS — J441 Chronic obstructive pulmonary disease with (acute) exacerbation: Secondary | ICD-10-CM | POA: Diagnosis not present

## 2014-12-11 DIAGNOSIS — E119 Type 2 diabetes mellitus without complications: Secondary | ICD-10-CM | POA: Diagnosis not present

## 2014-12-11 DIAGNOSIS — S82891D Other fracture of right lower leg, subsequent encounter for closed fracture with routine healing: Secondary | ICD-10-CM | POA: Diagnosis not present

## 2014-12-11 DIAGNOSIS — I10 Essential (primary) hypertension: Secondary | ICD-10-CM | POA: Diagnosis not present

## 2014-12-11 DIAGNOSIS — S93401D Sprain of unspecified ligament of right ankle, subsequent encounter: Secondary | ICD-10-CM | POA: Diagnosis not present

## 2014-12-11 DIAGNOSIS — I2510 Atherosclerotic heart disease of native coronary artery without angina pectoris: Secondary | ICD-10-CM | POA: Diagnosis not present

## 2014-12-12 DIAGNOSIS — S93491D Sprain of other ligament of right ankle, subsequent encounter: Secondary | ICD-10-CM | POA: Diagnosis not present

## 2014-12-12 DIAGNOSIS — S93491A Sprain of other ligament of right ankle, initial encounter: Secondary | ICD-10-CM | POA: Diagnosis not present

## 2014-12-15 DIAGNOSIS — S82891D Other fracture of right lower leg, subsequent encounter for closed fracture with routine healing: Secondary | ICD-10-CM | POA: Diagnosis not present

## 2014-12-15 DIAGNOSIS — E119 Type 2 diabetes mellitus without complications: Secondary | ICD-10-CM | POA: Diagnosis not present

## 2014-12-15 DIAGNOSIS — J441 Chronic obstructive pulmonary disease with (acute) exacerbation: Secondary | ICD-10-CM | POA: Diagnosis not present

## 2014-12-15 DIAGNOSIS — S93401D Sprain of unspecified ligament of right ankle, subsequent encounter: Secondary | ICD-10-CM | POA: Diagnosis not present

## 2014-12-15 DIAGNOSIS — I10 Essential (primary) hypertension: Secondary | ICD-10-CM | POA: Diagnosis not present

## 2014-12-15 DIAGNOSIS — I2510 Atherosclerotic heart disease of native coronary artery without angina pectoris: Secondary | ICD-10-CM | POA: Diagnosis not present

## 2014-12-16 DIAGNOSIS — S93401D Sprain of unspecified ligament of right ankle, subsequent encounter: Secondary | ICD-10-CM | POA: Diagnosis not present

## 2014-12-16 DIAGNOSIS — I2510 Atherosclerotic heart disease of native coronary artery without angina pectoris: Secondary | ICD-10-CM | POA: Diagnosis not present

## 2014-12-16 DIAGNOSIS — S82891D Other fracture of right lower leg, subsequent encounter for closed fracture with routine healing: Secondary | ICD-10-CM | POA: Diagnosis not present

## 2014-12-16 DIAGNOSIS — J441 Chronic obstructive pulmonary disease with (acute) exacerbation: Secondary | ICD-10-CM | POA: Diagnosis not present

## 2014-12-16 DIAGNOSIS — I10 Essential (primary) hypertension: Secondary | ICD-10-CM | POA: Diagnosis not present

## 2014-12-16 DIAGNOSIS — E119 Type 2 diabetes mellitus without complications: Secondary | ICD-10-CM | POA: Diagnosis not present

## 2014-12-17 DIAGNOSIS — J441 Chronic obstructive pulmonary disease with (acute) exacerbation: Secondary | ICD-10-CM | POA: Diagnosis not present

## 2014-12-17 DIAGNOSIS — I10 Essential (primary) hypertension: Secondary | ICD-10-CM | POA: Diagnosis not present

## 2014-12-17 DIAGNOSIS — I2510 Atherosclerotic heart disease of native coronary artery without angina pectoris: Secondary | ICD-10-CM | POA: Diagnosis not present

## 2014-12-17 DIAGNOSIS — S93401D Sprain of unspecified ligament of right ankle, subsequent encounter: Secondary | ICD-10-CM | POA: Diagnosis not present

## 2014-12-17 DIAGNOSIS — S82891D Other fracture of right lower leg, subsequent encounter for closed fracture with routine healing: Secondary | ICD-10-CM | POA: Diagnosis not present

## 2014-12-17 DIAGNOSIS — E119 Type 2 diabetes mellitus without complications: Secondary | ICD-10-CM | POA: Diagnosis not present

## 2014-12-22 DIAGNOSIS — J4540 Moderate persistent asthma, uncomplicated: Secondary | ICD-10-CM | POA: Diagnosis not present

## 2014-12-22 DIAGNOSIS — E1165 Type 2 diabetes mellitus with hyperglycemia: Secondary | ICD-10-CM | POA: Diagnosis not present

## 2014-12-22 DIAGNOSIS — B029 Zoster without complications: Secondary | ICD-10-CM | POA: Diagnosis not present

## 2015-01-05 DIAGNOSIS — E780 Pure hypercholesterolemia: Secondary | ICD-10-CM | POA: Diagnosis not present

## 2015-02-24 DIAGNOSIS — J449 Chronic obstructive pulmonary disease, unspecified: Secondary | ICD-10-CM | POA: Diagnosis not present

## 2015-02-24 DIAGNOSIS — I10 Essential (primary) hypertension: Secondary | ICD-10-CM | POA: Diagnosis not present

## 2015-02-24 DIAGNOSIS — R0602 Shortness of breath: Secondary | ICD-10-CM | POA: Diagnosis not present

## 2015-02-24 DIAGNOSIS — J45909 Unspecified asthma, uncomplicated: Secondary | ICD-10-CM | POA: Diagnosis not present

## 2015-03-31 DIAGNOSIS — J440 Chronic obstructive pulmonary disease with acute lower respiratory infection: Secondary | ICD-10-CM | POA: Diagnosis not present

## 2015-03-31 DIAGNOSIS — B372 Candidiasis of skin and nail: Secondary | ICD-10-CM | POA: Diagnosis not present

## 2015-05-07 DIAGNOSIS — J449 Chronic obstructive pulmonary disease, unspecified: Secondary | ICD-10-CM | POA: Diagnosis not present

## 2015-05-07 DIAGNOSIS — R0602 Shortness of breath: Secondary | ICD-10-CM | POA: Diagnosis not present

## 2015-05-07 DIAGNOSIS — I10 Essential (primary) hypertension: Secondary | ICD-10-CM | POA: Diagnosis not present

## 2015-05-13 DIAGNOSIS — E1165 Type 2 diabetes mellitus with hyperglycemia: Secondary | ICD-10-CM | POA: Diagnosis not present

## 2015-05-13 DIAGNOSIS — B372 Candidiasis of skin and nail: Secondary | ICD-10-CM | POA: Diagnosis not present

## 2015-05-13 DIAGNOSIS — I10 Essential (primary) hypertension: Secondary | ICD-10-CM | POA: Diagnosis not present

## 2015-05-13 DIAGNOSIS — J440 Chronic obstructive pulmonary disease with acute lower respiratory infection: Secondary | ICD-10-CM | POA: Diagnosis not present

## 2015-05-13 DIAGNOSIS — Z794 Long term (current) use of insulin: Secondary | ICD-10-CM | POA: Diagnosis not present

## 2015-05-13 DIAGNOSIS — J441 Chronic obstructive pulmonary disease with (acute) exacerbation: Secondary | ICD-10-CM | POA: Diagnosis not present

## 2015-05-13 DIAGNOSIS — Z9981 Dependence on supplemental oxygen: Secondary | ICD-10-CM | POA: Diagnosis not present

## 2015-05-13 DIAGNOSIS — I4891 Unspecified atrial fibrillation: Secondary | ICD-10-CM | POA: Diagnosis not present

## 2015-05-13 DIAGNOSIS — J209 Acute bronchitis, unspecified: Secondary | ICD-10-CM | POA: Diagnosis not present

## 2015-05-15 ENCOUNTER — Telehealth: Payer: Self-pay

## 2015-05-15 DIAGNOSIS — IMO0002 Reserved for concepts with insufficient information to code with codable children: Secondary | ICD-10-CM

## 2015-05-15 DIAGNOSIS — E1165 Type 2 diabetes mellitus with hyperglycemia: Secondary | ICD-10-CM

## 2015-05-15 NOTE — Telephone Encounter (Signed)
Ok for diabetic foot care

## 2015-05-15 NOTE — Telephone Encounter (Signed)
Patient called in and needs a referral to [facility] to get her Nails clipped.

## 2015-05-18 NOTE — Telephone Encounter (Signed)
Could you please order the referral for me for [facility] as I do not have access to do so.

## 2015-05-21 ENCOUNTER — Telehealth: Payer: Self-pay | Admitting: Family Medicine

## 2015-05-21 ENCOUNTER — Ambulatory Visit: Payer: Self-pay | Admitting: Family Medicine

## 2015-05-21 DIAGNOSIS — J449 Chronic obstructive pulmonary disease, unspecified: Secondary | ICD-10-CM | POA: Diagnosis not present

## 2015-05-21 NOTE — Telephone Encounter (Signed)
Karen [name] from [facility] states that patient blood sugar levels are elevated after lunch. It ranges between 300-340. Please advise [phone]

## 2015-05-22 NOTE — Telephone Encounter (Signed)
Increase insulin by 2 units in am and 1 in pm

## 2015-05-22 NOTE — Telephone Encounter (Signed)
Karen [name] from [facility] was informed.

## 2015-06-01 ENCOUNTER — Encounter: Payer: Self-pay | Admitting: Podiatry

## 2015-06-01 ENCOUNTER — Ambulatory Visit (INDEPENDENT_AMBULATORY_CARE_PROVIDER_SITE_OTHER): Payer: Medicare Other | Admitting: Podiatry

## 2015-06-01 DIAGNOSIS — B351 Tinea unguium: Secondary | ICD-10-CM | POA: Diagnosis not present

## 2015-06-01 DIAGNOSIS — M79673 Pain in unspecified foot: Secondary | ICD-10-CM | POA: Diagnosis not present

## 2015-06-01 DIAGNOSIS — E785 Hyperlipidemia, unspecified: Secondary | ICD-10-CM | POA: Insufficient documentation

## 2015-06-01 DIAGNOSIS — G4733 Obstructive sleep apnea (adult) (pediatric): Secondary | ICD-10-CM | POA: Insufficient documentation

## 2015-06-01 DIAGNOSIS — I10 Essential (primary) hypertension: Secondary | ICD-10-CM | POA: Insufficient documentation

## 2015-06-01 DIAGNOSIS — J449 Chronic obstructive pulmonary disease, unspecified: Secondary | ICD-10-CM | POA: Insufficient documentation

## 2015-06-01 DIAGNOSIS — Z87898 Personal history of other specified conditions: Secondary | ICD-10-CM | POA: Insufficient documentation

## 2015-06-01 DIAGNOSIS — E119 Type 2 diabetes mellitus without complications: Secondary | ICD-10-CM | POA: Insufficient documentation

## 2015-06-01 DIAGNOSIS — I4891 Unspecified atrial fibrillation: Secondary | ICD-10-CM | POA: Insufficient documentation

## 2015-06-01 LAB — HM DIABETES FOOT EXAM

## 2015-06-01 NOTE — Progress Notes (Signed)
Patient presents to the office today with a chief complaint of painful elongated toenails.  Objective: Pulses are palpable bilateral. Nails are thick yellow dystrophic clinically mycotic and painful palpation.  Assessment: Pain in limb secondary to onychomycosis 1 through 5 bilateral.  Plan: Debridement of nails 1 through 5 bilateral covered service secondary to pain.  

## 2015-06-10 ENCOUNTER — Other Ambulatory Visit: Payer: Self-pay | Admitting: Family Medicine

## 2015-06-17 ENCOUNTER — Telehealth: Payer: Self-pay | Admitting: Family Medicine

## 2015-06-17 NOTE — Telephone Encounter (Signed)
Pt is currently taking Novolin and her insurance company told her that it is no longer on the formulary for her to begin on humulin 70/30. She is also on humolog. Please return call to discuss this.  [employer] MEDICARE COMPLETE Member ID: [id number] Group No: [id number]

## 2015-06-24 ENCOUNTER — Telehealth: Payer: Self-pay | Admitting: Emergency Medicine

## 2015-06-24 NOTE — Telephone Encounter (Signed)
Patient notified that Dr. Thana AtesMorrisey stated that it was ok to switch to formulary drug for Novolin. Humulin 70/30 called to [facility].

## 2015-06-25 ENCOUNTER — Ambulatory Visit (INDEPENDENT_AMBULATORY_CARE_PROVIDER_SITE_OTHER): Payer: Medicare Other | Admitting: Family Medicine

## 2015-06-25 ENCOUNTER — Encounter: Payer: Self-pay | Admitting: Family Medicine

## 2015-06-25 VITALS — BP 122/60 | HR 101 | Temp 98.7°F | Resp 16 | Ht <= 58 in | Wt 193.4 lb

## 2015-06-25 DIAGNOSIS — G4733 Obstructive sleep apnea (adult) (pediatric): Secondary | ICD-10-CM

## 2015-06-25 DIAGNOSIS — E119 Type 2 diabetes mellitus without complications: Secondary | ICD-10-CM

## 2015-06-25 DIAGNOSIS — I482 Chronic atrial fibrillation, unspecified: Secondary | ICD-10-CM

## 2015-06-25 DIAGNOSIS — E1129 Type 2 diabetes mellitus with other diabetic kidney complication: Secondary | ICD-10-CM | POA: Diagnosis not present

## 2015-06-25 DIAGNOSIS — IMO0002 Reserved for concepts with insufficient information to code with codable children: Secondary | ICD-10-CM

## 2015-06-25 DIAGNOSIS — G990 Autonomic neuropathy in diseases classified elsewhere: Secondary | ICD-10-CM

## 2015-06-25 DIAGNOSIS — E1143 Type 2 diabetes mellitus with diabetic autonomic (poly)neuropathy: Secondary | ICD-10-CM

## 2015-06-25 DIAGNOSIS — E785 Hyperlipidemia, unspecified: Secondary | ICD-10-CM

## 2015-06-25 DIAGNOSIS — J449 Chronic obstructive pulmonary disease, unspecified: Secondary | ICD-10-CM | POA: Diagnosis not present

## 2015-06-25 DIAGNOSIS — E1121 Type 2 diabetes mellitus with diabetic nephropathy: Secondary | ICD-10-CM

## 2015-06-25 DIAGNOSIS — Z7901 Long term (current) use of anticoagulants: Secondary | ICD-10-CM

## 2015-06-25 DIAGNOSIS — E669 Obesity, unspecified: Secondary | ICD-10-CM | POA: Diagnosis not present

## 2015-06-25 DIAGNOSIS — I10 Essential (primary) hypertension: Secondary | ICD-10-CM

## 2015-06-25 DIAGNOSIS — E1165 Type 2 diabetes mellitus with hyperglycemia: Secondary | ICD-10-CM

## 2015-06-25 DIAGNOSIS — R809 Proteinuria, unspecified: Secondary | ICD-10-CM

## 2015-06-25 LAB — POCT UA - MICROALBUMIN: MICROALBUMIN (UR) POC: 50 mg/L

## 2015-06-25 LAB — GLUCOSE, POCT (MANUAL RESULT ENTRY): POC Glucose: 268 mg/dl — AB (ref 70–99)

## 2015-06-25 LAB — POCT GLYCOSYLATED HEMOGLOBIN (HGB A1C): HEMOGLOBIN A1C: 7.7

## 2015-06-25 NOTE — Progress Notes (Signed)
Name: Karen [name]   MRN: [id number]    DOB: 06/25/1941   Date:06/25/2015       Progress Note  Subjective  Chief Complaint  Chief Complaint  Patient presents with  . Hypertension  . Diabetes  . Hyperlipidemia  . COPD    Follow by Dr. Park Breed    Hypertension This is a chronic problem. The problem is unchanged. The problem is controlled. Associated symptoms include palpitations and shortness of breath. Pertinent negatives include no blurred vision, chest pain, headaches, neck pain or orthopnea. Risk factors for coronary artery disease include diabetes mellitus, dyslipidemia, obesity, post-menopausal state, sedentary lifestyle and stress. Past treatments include angiotensin blockers, calcium channel blockers and diuretics. There are no compliance problems.  Identifiable causes of hypertension include chronic renal disease.  Diabetes She presents for her follow-up diabetic visit. She has type 2 diabetes mellitus. Her disease course has been worsening. Hypoglycemia symptoms include nervousness/anxiousness. Pertinent negatives for hypoglycemia include no dizziness, headaches, seizures or tremors. Associated symptoms include fatigue. Pertinent negatives for diabetes include no blurred vision, no chest pain, no weakness and no weight loss. Symptoms are stable. Diabetic complications include nephropathy and peripheral neuropathy. Current diabetic treatment includes insulin injections. She is compliant with treatment most of the time. Her weight is increasing steadily. She is following a diabetic diet. She has had a previous visit with a dietitian. She never participates in exercise. Her overall blood glucose range is 180-200 mg/dl. An ACE inhibitor/angiotensin II receptor blocker is being taken.  Hyperlipidemia This is a chronic problem. The problem is controlled. Recent lipid tests were reviewed and are normal. Exacerbating diseases include chronic renal disease. Associated symptoms include shortness of  breath. Pertinent negatives include no chest pain, focal weakness or myalgias. Current antihyperlipidemic treatment includes statins. There are no compliance problems.  Risk factors for coronary artery disease include diabetes mellitus, dyslipidemia, hypertension, obesity, a sedentary lifestyle, post-menopausal and stress.     asthma/COPD    patient is seen by her pulmonologist on a regular basis. She currently is on Ventolin HFA and has been on prednisone orally as well as at a 10 mg dosage in addition to her regular inhalers.    atrial fibrillation patient has history of recurrent atrial fib. She currently is on verapamil and is being seen a cardiologist for this. No recent CHF no current chest pain.    Past Medical History  Diagnosis Date  . Diabetes mellitus without complication   . COPD (chronic obstructive pulmonary disease)   . Hypertension   . Hyperlipidemia     History  Substance Use Topics  . Smoking status: Never Smoker   . Smokeless tobacco: Not on file  . Alcohol Use: No     Current outpatient prescriptions:  .  B-D INS SYRINGE 0.5CC/31GX5/16 31G X 5/16" 0.5 ML MISC, , Disp: , Rfl:  .  clopidogrel (PLAVIX) 75 MG tablet, Take 75 mg by mouth daily with breakfast., Disp: , Rfl:  .  clotrimazole (LOTRIMIN) 1 % cream, , Disp: , Rfl:  .  digoxin (LANOXIN) 0.125 MG tablet, Take by mouth daily. One every other day, Disp: , Rfl:  .  HUMALOG KWIKPEN 100 UNIT/ML KiwkPen, , Disp: , Rfl:  .  levothyroxine (SYNTHROID, LEVOTHROID) 150 MCG tablet, Take 150 mcg by mouth daily before breakfast., Disp: , Rfl:  .  losartan (COZAAR) 100 MG tablet, Take 100 mg by mouth daily., Disp: , Rfl:  .  NOVOLIN 70/30 (70-30) 100 UNIT/ML injection, INJECT 50 UNITS SUBCUTANEOUSLY  EVERY MORNING AND 27 UNITS EVERY EVENING., Disp: 20 mL, Rfl: 2 .  pantoprazole (PROTONIX) 40 MG tablet, Take 40 mg by mouth daily., Disp: , Rfl:  .  pravastatin (PRAVACHOL) 40 MG tablet, Take 40 mg by mouth daily., Disp: ,  Rfl:  .  predniSONE (DELTASONE) 10 MG tablet, , Disp: , Rfl:  .  raloxifene (EVISTA) 60 MG tablet, , Disp: , Rfl:  .  UNIFINE PENTIPS 31G X 6 MM MISC, , Disp: , Rfl:  .  VENTOLIN HFA 108 (90 BASE) MCG/ACT inhaler, , Disp: , Rfl:  .  verapamil (COVERA HS) 240 MG (CO) 24 hr tablet, Take 240 mg by mouth at bedtime., Disp: , Rfl:  .  verapamil (VERELAN PM) 240 MG 24 hr capsule, , Disp: , Rfl:   Allergies  Allergen Reactions  . Ace Inhibitors Other (See Comments)    Review of Systems  Constitutional: Positive for fatigue. Negative for fever, chills and weight loss.  HENT: Positive for congestion. Negative for hearing loss, sore throat and tinnitus.   Eyes: Negative for blurred vision, double vision and redness.  Respiratory: Positive for cough, sputum production, shortness of breath and wheezing. Negative for hemoptysis.        Regularly under the care of her pulmonologist is on prednisone usually 10 mg daily  Cardiovascular: Positive for palpitations and leg swelling. Negative for chest pain, orthopnea and claudication.  Gastrointestinal: Negative for heartburn, nausea, vomiting, diarrhea, constipation and blood in stool.  Genitourinary: Negative for dysuria, urgency, frequency and hematuria.  Musculoskeletal: Positive for back pain and joint pain. Negative for myalgias, falls and neck pain.  Skin: Negative for itching.  Neurological: Positive for sensory change. Negative for dizziness, tingling, tremors, focal weakness, seizures, loss of consciousness, weakness and headaches.  Endo/Heme/Allergies: Bruises/bleeds easily.  Psychiatric/Behavioral: Positive for memory loss. Negative for depression and substance abuse. The patient is nervous/anxious. The patient does not have insomnia.      Objective  Filed Vitals:   06/25/15 1513  BP: 122/60  Pulse: 101  Temp: 98.7 F (37.1 C)  TempSrc: Oral  Resp: 16  Height: 4\' 8"  (1.422 m)  Weight: 193 lb 6.4 oz (87.726 kg)  SpO2: 96%      Physical Exam  Constitutional: She is oriented to person, place, and time and well-developed, well-nourished, and in no distress.  Morbidly obese  HENT:  Head: Normocephalic.  Eyes: EOM are normal. Pupils are equal, round, and reactive to light.  Neck: Normal range of motion. No thyromegaly present.  Cardiovascular: Normal rate and intact distal pulses.   No murmur heard. Irregularly irregular rate and rhythm  Pulmonary/Chest: Effort normal. She has wheezes. She has rales.  Abdominal: Soft. Bowel sounds are normal.  Musculoskeletal: Normal range of motion. She exhibits edema.  Neurological: She is alert and oriented to person, place, and time. No cranial nerve deficit. Gait normal.  Skin: Skin is warm and dry. No rash noted.  Psychiatric: Memory and affect normal.  Anxious and loquacious      Assessment & Plan 1. 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 Uncontrolled type 2 diabetes mellitus with nephropathy   3. Proteinuria due to type 2 diabetes mellitus   4. Asthma with COPD Per pulmonologist  5. Obesity, Class II, BMI 35-39.9 Encourage diet and exercise  6. Hyperlipidemia Controlled  8. Essential hypertension Controlled  9. Obstructive sleep apnea CPAP regularly  10.  Chronic atrial fibrillation  xarelto per pulmonologist  11. On continuous oral anticoagulation Xarelto

## 2015-06-26 DIAGNOSIS — J449 Chronic obstructive pulmonary disease, unspecified: Secondary | ICD-10-CM | POA: Insufficient documentation

## 2015-06-26 DIAGNOSIS — E1121 Type 2 diabetes mellitus with diabetic nephropathy: Secondary | ICD-10-CM | POA: Insufficient documentation

## 2015-06-26 DIAGNOSIS — E785 Hyperlipidemia, unspecified: Secondary | ICD-10-CM | POA: Insufficient documentation

## 2015-06-26 DIAGNOSIS — E1165 Type 2 diabetes mellitus with hyperglycemia: Secondary | ICD-10-CM

## 2015-06-26 DIAGNOSIS — G4733 Obstructive sleep apnea (adult) (pediatric): Secondary | ICD-10-CM | POA: Insufficient documentation

## 2015-06-26 DIAGNOSIS — E1129 Type 2 diabetes mellitus with other diabetic kidney complication: Secondary | ICD-10-CM | POA: Insufficient documentation

## 2015-06-26 DIAGNOSIS — R809 Proteinuria, unspecified: Secondary | ICD-10-CM

## 2015-06-26 DIAGNOSIS — Z7901 Long term (current) use of anticoagulants: Secondary | ICD-10-CM | POA: Insufficient documentation

## 2015-06-26 DIAGNOSIS — E669 Obesity, unspecified: Secondary | ICD-10-CM | POA: Insufficient documentation

## 2015-06-26 DIAGNOSIS — IMO0002 Reserved for concepts with insufficient information to code with codable children: Secondary | ICD-10-CM | POA: Insufficient documentation

## 2015-06-27 ENCOUNTER — Other Ambulatory Visit: Payer: Self-pay | Admitting: Family Medicine

## 2015-07-03 ENCOUNTER — Other Ambulatory Visit: Payer: Self-pay | Admitting: Family Medicine

## 2015-07-21 ENCOUNTER — Other Ambulatory Visit: Payer: Self-pay | Admitting: Family Medicine

## 2015-08-06 ENCOUNTER — Other Ambulatory Visit: Payer: Self-pay

## 2015-08-06 MED ORDER — LEVOTHYROXINE SODIUM 150 MCG PO TABS: 150.0000 ug | ORAL_TABLET | Freq: Every day | ORAL | Status: DC

## 2015-08-06 MED ORDER — LOSARTAN POTASSIUM 100 MG PO TABS: 100.0000 mg | ORAL_TABLET | Freq: Every day | ORAL | Status: DC

## 2015-08-13 ENCOUNTER — Other Ambulatory Visit: Payer: Self-pay | Admitting: Emergency Medicine

## 2015-08-13 MED ORDER — PRAVASTATIN SODIUM 40 MG PO TABS: 40.0000 mg | ORAL_TABLET | Freq: Every day | ORAL | Status: DC

## 2015-08-24 DIAGNOSIS — J440 Chronic obstructive pulmonary disease with acute lower respiratory infection: Secondary | ICD-10-CM | POA: Diagnosis not present

## 2015-08-24 DIAGNOSIS — J309 Allergic rhinitis, unspecified: Secondary | ICD-10-CM | POA: Diagnosis not present

## 2015-08-25 ENCOUNTER — Other Ambulatory Visit: Payer: Self-pay | Admitting: Family Medicine

## 2015-09-01 ENCOUNTER — Ambulatory Visit: Payer: Medicare Other

## 2015-09-05 ENCOUNTER — Other Ambulatory Visit: Payer: Self-pay | Admitting: Family Medicine

## 2015-09-17 ENCOUNTER — Other Ambulatory Visit: Payer: Self-pay | Admitting: Family Medicine

## 2015-09-18 ENCOUNTER — Other Ambulatory Visit: Payer: Self-pay | Admitting: Family Medicine

## 2015-09-22 ENCOUNTER — Telehealth: Payer: Self-pay | Admitting: Family Medicine

## 2015-09-22 ENCOUNTER — Ambulatory Visit
Admission: RE | Admit: 2015-09-22 | Discharge: 2015-09-22 | Disposition: A | Discharge status: Home / Self Care | Payer: Medicare Other | Source: Ambulatory Visit | Attending: Internal Medicine | Admitting: Internal Medicine

## 2015-09-22 ENCOUNTER — Other Ambulatory Visit: Payer: Self-pay | Admitting: Internal Medicine

## 2015-09-22 DIAGNOSIS — R918 Other nonspecific abnormal finding of lung field: Secondary | ICD-10-CM | POA: Diagnosis not present

## 2015-09-22 DIAGNOSIS — J918 Pleural effusion in other conditions classified elsewhere: Secondary | ICD-10-CM | POA: Diagnosis not present

## 2015-09-22 DIAGNOSIS — R062 Wheezing: Secondary | ICD-10-CM

## 2015-09-22 DIAGNOSIS — R0602 Shortness of breath: Secondary | ICD-10-CM

## 2015-09-22 DIAGNOSIS — I5033 Acute on chronic diastolic (congestive) heart failure: Secondary | ICD-10-CM | POA: Diagnosis not present

## 2015-09-22 DIAGNOSIS — J90 Pleural effusion, not elsewhere classified: Secondary | ICD-10-CM | POA: Diagnosis not present

## 2015-09-22 DIAGNOSIS — Z7689 Persons encountering health services in other specified circumstances: Secondary | ICD-10-CM | POA: Diagnosis not present

## 2015-09-22 DIAGNOSIS — R635 Abnormal weight gain: Secondary | ICD-10-CM

## 2015-09-22 DIAGNOSIS — J441 Chronic obstructive pulmonary disease with (acute) exacerbation: Secondary | ICD-10-CM | POA: Diagnosis not present

## 2015-09-22 DIAGNOSIS — B372 Candidiasis of skin and nail: Secondary | ICD-10-CM | POA: Diagnosis not present

## 2015-09-22 NOTE — Telephone Encounter (Signed)
Karen [name] from Dr [facility] office states patient was seen today by Dr [facility] office and due to her 10 pound weight gain and shortness of breath, they are suggesting that a referral be [facility] as soon as possible.

## 2015-09-23 NOTE — Telephone Encounter (Signed)
Referral has been placed. 

## 2015-09-23 NOTE — Telephone Encounter (Signed)
Agree to make referral

## 2015-09-24 DIAGNOSIS — I509 Heart failure, unspecified: Secondary | ICD-10-CM | POA: Diagnosis not present

## 2015-09-24 DIAGNOSIS — G4733 Obstructive sleep apnea (adult) (pediatric): Secondary | ICD-10-CM | POA: Diagnosis not present

## 2015-09-24 DIAGNOSIS — R600 Localized edema: Secondary | ICD-10-CM | POA: Diagnosis not present

## 2015-09-24 DIAGNOSIS — I10 Essential (primary) hypertension: Secondary | ICD-10-CM | POA: Diagnosis not present

## 2015-09-24 DIAGNOSIS — R0602 Shortness of breath: Secondary | ICD-10-CM | POA: Diagnosis not present

## 2015-10-02 DIAGNOSIS — I509 Heart failure, unspecified: Secondary | ICD-10-CM | POA: Diagnosis not present

## 2015-10-02 DIAGNOSIS — R0602 Shortness of breath: Secondary | ICD-10-CM | POA: Diagnosis not present

## 2015-10-02 DIAGNOSIS — R600 Localized edema: Secondary | ICD-10-CM | POA: Diagnosis not present

## 2015-10-05 ENCOUNTER — Telehealth: Payer: Self-pay | Admitting: Family Medicine

## 2015-10-05 ENCOUNTER — Other Ambulatory Visit: Payer: Self-pay | Admitting: Emergency Medicine

## 2015-10-05 NOTE — Telephone Encounter (Signed)
Patient was informed to speak to Dr. [facility]n [facility] office about side effects from medication since they started her on the medication.

## 2015-10-05 NOTE — Telephone Encounter (Signed)
Pt states that Dr Juliann [facility] put her on fluid pills and she has water blisters on her legs and wants to know what she needs to do about this. Please advise pt.

## 2015-10-10 DIAGNOSIS — I5032 Chronic diastolic (congestive) heart failure: Secondary | ICD-10-CM | POA: Diagnosis not present

## 2015-10-10 DIAGNOSIS — I110 Hypertensive heart disease with heart failure: Secondary | ICD-10-CM | POA: Diagnosis not present

## 2015-10-10 DIAGNOSIS — J441 Chronic obstructive pulmonary disease with (acute) exacerbation: Secondary | ICD-10-CM | POA: Diagnosis not present

## 2015-10-10 DIAGNOSIS — Z9981 Dependence on supplemental oxygen: Secondary | ICD-10-CM | POA: Diagnosis not present

## 2015-10-10 DIAGNOSIS — Z7952 Long term (current) use of systemic steroids: Secondary | ICD-10-CM | POA: Diagnosis not present

## 2015-10-10 DIAGNOSIS — E782 Mixed hyperlipidemia: Secondary | ICD-10-CM | POA: Diagnosis not present

## 2015-10-10 DIAGNOSIS — Z9181 History of falling: Secondary | ICD-10-CM | POA: Diagnosis not present

## 2015-10-10 DIAGNOSIS — R32 Unspecified urinary incontinence: Secondary | ICD-10-CM | POA: Diagnosis not present

## 2015-10-10 DIAGNOSIS — E1165 Type 2 diabetes mellitus with hyperglycemia: Secondary | ICD-10-CM | POA: Diagnosis not present

## 2015-10-10 DIAGNOSIS — Z7902 Long term (current) use of antithrombotics/antiplatelets: Secondary | ICD-10-CM | POA: Diagnosis not present

## 2015-10-10 DIAGNOSIS — Z794 Long term (current) use of insulin: Secondary | ICD-10-CM | POA: Diagnosis not present

## 2015-10-16 DIAGNOSIS — Z7902 Long term (current) use of antithrombotics/antiplatelets: Secondary | ICD-10-CM | POA: Diagnosis not present

## 2015-10-16 DIAGNOSIS — J441 Chronic obstructive pulmonary disease with (acute) exacerbation: Secondary | ICD-10-CM | POA: Diagnosis not present

## 2015-10-16 DIAGNOSIS — E782 Mixed hyperlipidemia: Secondary | ICD-10-CM | POA: Diagnosis not present

## 2015-10-16 DIAGNOSIS — Z9181 History of falling: Secondary | ICD-10-CM | POA: Diagnosis not present

## 2015-10-16 DIAGNOSIS — E1165 Type 2 diabetes mellitus with hyperglycemia: Secondary | ICD-10-CM | POA: Diagnosis not present

## 2015-10-16 DIAGNOSIS — Z7952 Long term (current) use of systemic steroids: Secondary | ICD-10-CM | POA: Diagnosis not present

## 2015-10-16 DIAGNOSIS — I110 Hypertensive heart disease with heart failure: Secondary | ICD-10-CM | POA: Diagnosis not present

## 2015-10-16 DIAGNOSIS — R32 Unspecified urinary incontinence: Secondary | ICD-10-CM | POA: Diagnosis not present

## 2015-10-16 DIAGNOSIS — Z9981 Dependence on supplemental oxygen: Secondary | ICD-10-CM | POA: Diagnosis not present

## 2015-10-16 DIAGNOSIS — I5032 Chronic diastolic (congestive) heart failure: Secondary | ICD-10-CM | POA: Diagnosis not present

## 2015-10-16 DIAGNOSIS — Z794 Long term (current) use of insulin: Secondary | ICD-10-CM | POA: Diagnosis not present

## 2015-10-19 ENCOUNTER — Ambulatory Visit (INDEPENDENT_AMBULATORY_CARE_PROVIDER_SITE_OTHER): Payer: Medicare Other | Admitting: Family Medicine

## 2015-10-19 ENCOUNTER — Encounter: Payer: Self-pay | Admitting: Family Medicine

## 2015-10-19 VITALS — BP 118/60 | HR 114 | Temp 98.6°F | Resp 18 | Ht <= 58 in | Wt 189.5 lb

## 2015-10-19 DIAGNOSIS — E1121 Type 2 diabetes mellitus with diabetic nephropathy: Secondary | ICD-10-CM | POA: Diagnosis not present

## 2015-10-19 DIAGNOSIS — I83009 Varicose veins of unspecified lower extremity with ulcer of unspecified site: Secondary | ICD-10-CM

## 2015-10-19 DIAGNOSIS — E119 Type 2 diabetes mellitus without complications: Secondary | ICD-10-CM | POA: Insufficient documentation

## 2015-10-19 DIAGNOSIS — E785 Hyperlipidemia, unspecified: Secondary | ICD-10-CM

## 2015-10-19 DIAGNOSIS — J449 Chronic obstructive pulmonary disease, unspecified: Secondary | ICD-10-CM | POA: Diagnosis not present

## 2015-10-19 DIAGNOSIS — E1169 Type 2 diabetes mellitus with other specified complication: Secondary | ICD-10-CM | POA: Diagnosis not present

## 2015-10-19 DIAGNOSIS — E038 Other specified hypothyroidism: Secondary | ICD-10-CM

## 2015-10-19 DIAGNOSIS — L03116 Cellulitis of left lower limb: Secondary | ICD-10-CM

## 2015-10-19 DIAGNOSIS — G4733 Obstructive sleep apnea (adult) (pediatric): Secondary | ICD-10-CM

## 2015-10-19 DIAGNOSIS — R238 Other skin changes: Secondary | ICD-10-CM

## 2015-10-19 DIAGNOSIS — J441 Chronic obstructive pulmonary disease with (acute) exacerbation: Secondary | ICD-10-CM | POA: Insufficient documentation

## 2015-10-19 DIAGNOSIS — I480 Paroxysmal atrial fibrillation: Secondary | ICD-10-CM

## 2015-10-19 DIAGNOSIS — R233 Spontaneous ecchymoses: Secondary | ICD-10-CM

## 2015-10-19 DIAGNOSIS — I83029 Varicose veins of left lower extremity with ulcer of unspecified site: Secondary | ICD-10-CM

## 2015-10-19 DIAGNOSIS — J45909 Unspecified asthma, uncomplicated: Secondary | ICD-10-CM | POA: Diagnosis not present

## 2015-10-19 DIAGNOSIS — L97909 Non-pressure chronic ulcer of unspecified part of unspecified lower leg with unspecified severity: Secondary | ICD-10-CM

## 2015-10-19 DIAGNOSIS — I4891 Unspecified atrial fibrillation: Secondary | ICD-10-CM | POA: Insufficient documentation

## 2015-10-19 DIAGNOSIS — E1165 Type 2 diabetes mellitus with hyperglycemia: Secondary | ICD-10-CM

## 2015-10-19 DIAGNOSIS — IMO0001 Reserved for inherently not codable concepts without codable children: Secondary | ICD-10-CM

## 2015-10-19 DIAGNOSIS — I878 Other specified disorders of veins: Secondary | ICD-10-CM

## 2015-10-19 DIAGNOSIS — IMO0002 Reserved for concepts with insufficient information to code with codable children: Secondary | ICD-10-CM

## 2015-10-19 LAB — POCT GLYCOSYLATED HEMOGLOBIN (HGB A1C): Hemoglobin A1C: 7.2

## 2015-10-19 LAB — GLUCOSE, POCT (MANUAL RESULT ENTRY): POC GLUCOSE: 281 mg/dL — AB (ref 70–99)

## 2015-10-19 MED ORDER — CEPHALEXIN 500 MG PO CAPS: 500.0000 mg | ORAL_CAPSULE | Freq: Four times a day (QID) | ORAL | Status: DC

## 2015-10-19 NOTE — Progress Notes (Signed)
Name: Karen [name]   MRN: [id number]    DOB: 13-Mar-1941   Date:10/19/2015       Progress Note  Subjective  Chief Complaint  Chief Complaint  Patient presents with  . Diabetes  . Hyperlipidemia  . Obesity    HPI  Diabetes  Patient presents for follow-up of diabetes which is present for over 5 he will and 7030 and Humalog on a when necessary basis. years. Is currently on a regimen of Humulin 70/30 and Humalog. Patient  states  is usually complianth their diet and exercise. There's been no hypoglycemic episodes and theris no polia polydipsia polyphagia. His average fasting glucoses been in the low around  - a high around -here is no end organ disease.  Last diabetic eye exam wasearlier this year.   Last visit with dietitian was several years ago.  Last microalbumin when he me was 71/2016 and was elevated at 50.  Marland Kitchen   Hypertension   Patient presents for follow-up of hypertension. It has been present for overover 5  years.  Patient states that there is compliance with medical regimen which consistsfurosemide 20 mg daily losartan 100 mg daily and verapamil 240 mg daily. There is no end organ disease. Cardiac risk factors include hypertension hyperlipidemia and diabetes.  Exercise regimen consist none.  Diet consist some salt restriction.  Hyperlipidemia  Patient has a history of hyperlipover 5 years.  Current medical regimen consist pravastatin 40 mg daily at bedtime.  ComplianGood.  Diet and exercise are currently followedminimallyRisk factors for cardiovascular disease include hyperlipdiabetes hypertension advanced age There have been no side effects from the medication.    Asthma  Patient continues under the care of a pulmonologist for asthma. She is intermittently on varying dosages of prednisone but usually her baseline until less than 10 mg daily. She also has inhalers and nebulizer usage. Pulmonology care is regular.  Venous stasis ulcer  Patient is noted lower extremity edema with  weeping and ulcerated area on the left lower extremity for the past several weeks. Home care is been doing some cleansing. This been no fever or chills.  Past Medical History  Diagnosis Date  . Diabetes mellitus without complication (HCC)   . COPD (chronic obstructive pulmonary disease) (HCC)   . Hypertension   . Hyperlipidemia     Social History  Substance Use Topics  . Smoking status: Never Smoker   . Smokeless tobacco: Not on file  . Alcohol Use: No     Current outpatient prescriptions:  .  furosemide (LASIX) 20 MG tablet, Take by mouth., Disp: , Rfl:  .  B-D INS SYRINGE 0.5CC/31GX5/16 31G X 5/16" 0.5 ML MISC, USE TO INJECT INSULIN TWICE DAILY., Disp: 100 each, Rfl: 6 .  clopidogrel (PLAVIX) 75 MG tablet, TAKE 1 TABLET BY MOUTH ONCE DAILY., Disp: 30 tablet, Rfl: 11 .  clotrimazole (LOTRIMIN) 1 % cream, , Disp: , Rfl:  .  digoxin (LANOXIN) 0.125 MG tablet, Take by mouth daily. One every other day, Disp: , Rfl:  .  HUMALOG KWIKPEN 100 UNIT/ML KiwkPen, INJECT 5 UNITS SUBCUTANEOUSLY AT LUNCH IF BLOOD SUGAR IS 150-200mg /dL, 10 UNITSIF [id number]$ /dL, 15 UNITS IF 254-270 mg/dL, AND 20 UNITS IF O, Disp: 3 mL, Rfl: 6 .  HUMULIN 70/30 (70-30) 100 UNIT/ML injection, INJECT 50 UNITS SUBCUTANEOUSLY EVERY MORNING AND 27 UNITS EVERY EVENING., Disp: 20 mL, Rfl: 5 .  levothyroxine (SYNTHROID, LEVOTHROID) 150 MCG tablet, TAKE 1 TABLET BY MOUTH ONCE DAILY ON AN EMPTY STOMACH. WAIT 30 MINUTES  BEFORE TAKING OTHER MEDS., Disp: 90 tablet, Rfl: 1 .  losartan (COZAAR) 100 MG tablet, TAKE 1 TABLET BY MOUTH ONCE DAILY., Disp: 90 tablet, Rfl: 1 .  pantoprazole (PROTONIX) 40 MG tablet, Take 40 mg by mouth daily., Disp: , Rfl:  .  pravastatin (PRAVACHOL) 40 MG tablet, Take 1 tablet (40 mg total) by mouth daily., Disp: 30 tablet, Rfl: 2 .  predniSONE (DELTASONE) 10 MG tablet, , Disp: , Rfl:  .  raloxifene (EVISTA) 60 MG tablet, TAKE 1 TABLET BY MOUTH ONCE DAILY., Disp: 30 tablet, Rfl: 11 .  UNIFINE PENTIPS 31G X  6 MM MISC, , Disp: , Rfl:  .  VENTOLIN HFA 108 (90 BASE) MCG/ACT inhaler, , Disp: , Rfl:  .  verapamil (COVERA HS) 240 MG (CO) 24 hr tablet, Take 240 mg by mouth at bedtime., Disp: , Rfl:  .  verapamil (VERELAN PM) 240 MG 24 hr capsule, TAKE 1 CAPSULE BY MOUTH ONCE DAILY., Disp: 30 capsule, Rfl: 5  Allergies  Allergen Reactions  . Ace Inhibitors Other (See Comments)    Review of Systems  Constitutional: Negative for fever, chills and weight loss.  HENT: Positive for congestion. Negative for hearing loss, sore throat and tinnitus.   Eyes: Negative for blurred vision, double vision and redness.  Respiratory: Positive for cough, shortness of breath and wheezing. Negative for hemoptysis.        Follow-up pulmonologist  Cardiovascular: Positive for palpitations and leg swelling. Negative for chest pain, orthopnea and claudication.  Gastrointestinal: Positive for heartburn. Negative for nausea, vomiting, diarrhea, constipation and blood in stool.  Genitourinary: Negative for dysuria, urgency, frequency and hematuria.  Musculoskeletal: Positive for joint pain. Negative for myalgias, back pain, falls and neck pain.  Skin: Negative for itching.       Ulceration and erythema of the left lower extremity  Neurological: Negative for dizziness, tingling, tremors, focal weakness, seizures, loss of consciousness, weakness and headaches.  Endo/Heme/Allergies: Bruises/bleeds easily.  Psychiatric/Behavioral: Negative for depression and substance abuse. The patient is nervous/anxious and has insomnia.      Objective  Filed Vitals:   10/19/15 1140  BP: 118/60  Pulse: 114  Temp: 98.6 F (37 C)  Resp: 18  Height: 4\' 8"  (1.422 m)  Weight: 189 lb 8 oz (85.957 kg)  SpO2: 95%     Physical Exam  Constitutional: She is oriented to person, place, and time.  Obese female with mild respiratory distress using O2 by nasal cannula  HENT:  Head: Normocephalic.  Eyes: EOM are normal. Pupils are equal,  round, and reactive to light.  Neck: Normal range of motion. No thyromegaly present.  Cardiovascular: Normal rate, regular rhythm and normal heart sounds.   No murmur heard. Pulmonary/Chest:  Diminished breath sounds throughout with hyperresonance and increased labored breathing  Abdominal: Soft. Bowel sounds are normal.  Musculoskeletal: Normal range of motion. She exhibits edema (1+ pretibial edema).  Neurological: She is alert and oriented to person, place, and time. No cranial nerve deficit. Gait normal.  Skin: No rash noted. There is erythema.  There is erythema of the left and anterior tibial area measuring about 4 x 8 cm. There is a 3 cm venous stasis ulcer on the left anterior area which is clean. A culture was taken.  Several areas of easy bruising also noted  Psychiatric: Memory normal.  Quite anxious and loquacious      Assessment & Plan    1. Type 2 diabetes mellitus with other specified complication (HCC) At  goal for age - POCT HgB A1C - POCT Glucose (CBG)  2. Venous ulcer (HCC) Recheck again in 3 days and consider referral to wound clinic - cephALEXin (KEFLEX) 500 MG capsule; Take 1 capsule (500 mg total) by mouth 4 (four) times daily.  Dispense: 40 capsule; Refill: 0 - Wound culture - Apply unna boot; Standing - Apply unna boot  3. Other specified hypothyroidism Lab - TSH  4. Hyperlipidemia Labs - Comprehensive Metabolic Panel (CMET) - Lipid Profile  5. Paroxysmal atrial fibrillation (HCC) Stable  6. Obstructive apnea Continue CPAP  7. Asthma with COPD (HCC) Pulmonologist  8. Easy bruising Stable  9. Venous stasis ulcers, left (HCC) As above  10. Cellulitis of left lower extremity Cephalexin 4 times a day for 10 day

## 2015-10-20 DIAGNOSIS — R238 Other skin changes: Secondary | ICD-10-CM | POA: Insufficient documentation

## 2015-10-20 DIAGNOSIS — R233 Spontaneous ecchymoses: Secondary | ICD-10-CM | POA: Insufficient documentation

## 2015-10-21 LAB — WOUND CULTURE

## 2015-10-22 ENCOUNTER — Ambulatory Visit (INDEPENDENT_AMBULATORY_CARE_PROVIDER_SITE_OTHER): Payer: Medicare Other | Admitting: Family Medicine

## 2015-10-22 ENCOUNTER — Encounter: Payer: Self-pay | Admitting: Family Medicine

## 2015-10-22 ENCOUNTER — Telehealth: Payer: Self-pay | Admitting: Family Medicine

## 2015-10-22 VITALS — BP 108/76 | HR 111 | Temp 98.6°F | Resp 16 | Ht <= 58 in | Wt 189.7 lb

## 2015-10-22 DIAGNOSIS — I83029 Varicose veins of left lower extremity with ulcer of unspecified site: Secondary | ICD-10-CM | POA: Diagnosis not present

## 2015-10-22 DIAGNOSIS — L97929 Non-pressure chronic ulcer of unspecified part of left lower leg with unspecified severity: Principal | ICD-10-CM

## 2015-10-22 DIAGNOSIS — E0590 Thyrotoxicosis, unspecified without thyrotoxic crisis or storm: Secondary | ICD-10-CM | POA: Diagnosis not present

## 2015-10-22 DIAGNOSIS — E785 Hyperlipidemia, unspecified: Secondary | ICD-10-CM | POA: Diagnosis not present

## 2015-10-22 MED ORDER — FUROSEMIDE 20 MG PO TABS: 20.0000 mg | ORAL_TABLET | Freq: Every day | ORAL | Status: DC

## 2015-10-22 NOTE — Telephone Encounter (Signed)
Pt informed

## 2015-10-22 NOTE — Telephone Encounter (Signed)
Sent medication to pharmacy with 3 refills.

## 2015-10-22 NOTE — Telephone Encounter (Signed)
Pt was here today and forgot to mention that she needs refills on her fluid pills to be sent to [facility] in [place]. She only has 2 pills left.

## 2015-10-23 LAB — LIPID PANEL
CHOL/HDL RATIO: 2.9 ratio (ref 0.0–4.4)
Cholesterol, Total: 214 mg/dL — ABNORMAL HIGH (ref 100–199)
HDL: 73 mg/dL (ref >39)
LDL Calculated: 122 mg/dL — ABNORMAL HIGH (ref 0–99)
Triglycerides: 93 mg/dL (ref 0–149)
VLDL Cholesterol Cal: 19 mg/dL (ref 5–40)

## 2015-10-23 LAB — COMPREHENSIVE METABOLIC PANEL
ALBUMIN: 3.9 g/dL (ref 3.5–4.8)
ALT: 14 IU/L (ref 0–32)
AST: 17 IU/L (ref 0–40)
Albumin/Globulin Ratio: 1.9 (ref 1.1–2.5)
Alkaline Phosphatase: 51 IU/L (ref 39–117)
BUN / CREAT RATIO: 23 (ref 11–26)
BUN: 18 mg/dL (ref 8–27)
Bilirubin Total: 0.3 mg/dL (ref 0.0–1.2)
CALCIUM: 9.3 mg/dL (ref 8.7–10.3)
CO2: 30 mmol/L — AB (ref 18–29)
CREATININE: 0.79 mg/dL (ref 0.57–1.00)
Chloride: 101 mmol/L (ref 97–106)
GFR, EST AFRICAN AMERICAN: 85 mL/min/{1.73_m2} (ref >59)
GFR, EST NON AFRICAN AMERICAN: 74 mL/min/{1.73_m2} (ref >59)
GLUCOSE: 133 mg/dL — AB (ref 65–99)
Globulin, Total: 2.1 g/dL (ref 1.5–4.5)
Potassium: 3.9 mmol/L (ref 3.5–5.2)
Sodium: 145 mmol/L — ABNORMAL HIGH (ref 136–144)
TOTAL PROTEIN: 6 g/dL (ref 6.0–8.5)

## 2015-10-23 LAB — TSH: TSH: 1.72 u[IU]/mL (ref 0.450–4.500)

## 2015-10-25 NOTE — Progress Notes (Signed)
Name: Karen [name]   MRN: [id number]    DOB: [birth year]   Date:10/25/2015       Progress Note  Subjective  Chief Complaint  Chief Complaint  Patient presents with  . Venous Stasis Ulcer    3 day follow up left leg    HPI  Venous stasis ulceration with cellulitis  Patient had an Unna boot applied 3 days ago. She has cellulitis of the left lower extremity with a venous stasis ulcer and lower extremity deep edema and weeping. She has noticed some improvement in her symptomatology. There is no fever or shaking chills.  Past Medical History  Diagnosis Date  . Diabetes mellitus without complication (HCC)   . COPD (chronic obstructive pulmonary disease) (HCC)   . Hypertension   . Hyperlipidemia     Social History  Substance Use Topics  . Smoking status: Never Smoker   . Smokeless tobacco: Not on file  . Alcohol Use: No     Current outpatient prescriptions:  .  B-D INS SYRINGE 0.5CC/31GX5/16 31G X 5/16" 0.5 ML MISC, USE TO INJECT INSULIN TWICE DAILY., Disp: 100 each, Rfl: 6 .  cephALEXin (KEFLEX) 500 MG capsule, Take 1 capsule (500 mg total) by mouth 4 (four) times daily., Disp: 40 capsule, Rfl: 0 .  clopidogrel (PLAVIX) 75 MG tablet, TAKE 1 TABLET BY MOUTH ONCE DAILY., Disp: 30 tablet, Rfl: 11 .  clotrimazole (LOTRIMIN) 1 % cream, , Disp: , Rfl:  .  digoxin (LANOXIN) 0.125 MG tablet, Take by mouth daily. One every other day, Disp: , Rfl:  .  furosemide (LASIX) 20 MG tablet, Take 1 tablet (20 mg total) by mouth daily., Disp: 30 tablet, Rfl: 3 .  HUMALOG KWIKPEN 100 UNIT/ML KiwkPen, INJECT 5 UNITS SUBCUTANEOUSLY AT LUNCH IF BLOOD SUGAR IS 150-200mg /dL, 10 UNITSIF 884-$ZYSAYTKZSWFUXNAT_FTDDUKGURKYHCWCBJSEGBTDVVOHYWVPX$$TGGYIRSWNIOEVOJJ_KKXFGHWEXHBZJIRCVELFYBOFBPZWCHEN$200-250mg /dL, 15 UNITS IF [id number] mg/dL, AND 20 UNITS IF O, Disp: 3 mL, Rfl: 6 .  HUMULIN 70/30 (70-30) 100 UNIT/ML injection, INJECT 50 UNITS SUBCUTANEOUSLY EVERY MORNING AND 27 UNITS EVERY EVENING., Disp: 20 mL, Rfl: 5 .  levothyroxine (SYNTHROID, LEVOTHROID) 150 MCG tablet, TAKE 1 TABLET BY MOUTH ONCE DAILY ON AN EMPTY  STOMACH. WAIT 30 MINUTES BEFORE TAKING OTHER MEDS., Disp: 90 tablet, Rfl: 1 .  losartan (COZAAR) 100 MG tablet, TAKE 1 TABLET BY MOUTH ONCE DAILY., Disp: 90 tablet, Rfl: 1 .  pantoprazole (PROTONIX) 40 MG tablet, Take 40 mg by mouth daily., Disp: , Rfl:  .  pravastatin (PRAVACHOL) 40 MG tablet, Take 1 tablet (40 mg total) by mouth daily., Disp: 30 tablet, Rfl: 2 .  predniSONE (DELTASONE) 10 MG tablet, , Disp: , Rfl:  .  raloxifene (EVISTA) 60 MG tablet, TAKE 1 TABLET BY MOUTH ONCE DAILY., Disp: 30 tablet, Rfl: 11 .  UNIFINE PENTIPS 31G X 6 MM MISC, , Disp: , Rfl:  .  VENTOLIN HFA 108 (90 BASE) MCG/ACT inhaler, , Disp: , Rfl:  .  verapamil (COVERA HS) 240 MG (CO) 24 hr tablet, Take 240 mg by mouth at bedtime., Disp: , Rfl:  .  verapamil (VERELAN PM) 240 MG 24 hr capsule, TAKE 1 CAPSULE BY MOUTH ONCE DAILY., Disp: 30 capsule, Rfl: 5  Allergies  Allergen Reactions  . Ace Inhibitors Other (See Comments)    Review of Systems  Constitutional: Negative for fever, chills and weight loss.  HENT: Positive for congestion. Negative for hearing loss, sore throat and tinnitus.   Eyes: Negative for blurred vision, double vision and redness.  Respiratory: Positive for cough, shortness  of breath and wheezing. Negative for hemoptysis.        Follow-up pulmonologist  Cardiovascular: Positive for palpitations and leg swelling. Negative for chest pain, orthopnea and claudication.  Gastrointestinal: Positive for heartburn. Negative for nausea, vomiting, diarrhea, constipation and blood in stool.  Genitourinary: Negative for dysuria, urgency, frequency and hematuria.  Musculoskeletal: Positive for joint pain. Negative for myalgias, back pain, falls and neck pain.  Skin: Negative for itching.       Ulceration and erythema of the left lower extremity  Neurological: Negative for dizziness, tingling, tremors, focal weakness, seizures, loss of consciousness, weakness and headaches.  Endo/Heme/Allergies:  Bruises/bleeds easily.  Psychiatric/Behavioral: Negative for depression and substance abuse. The patient is nervous/anxious and has insomnia.      Objective  Filed Vitals:   10/22/15 0742  BP: 108/76  Pulse: 111  Temp: 98.6 F (37 C)  TempSrc: Oral  Resp: 16  Height:  (1.422 m)  Weight: 189 lb 11.2 oz (86.047 kg)  SpO2: 97%     Physical Exam  Skin:  The erythema and edema have decreased. The ulcer has also diminished in size. The wound culture so far has returned growing staph aureus which is sensitive to oxacillin.      Assessment & Plan  1. Venous ulcer of left leg (HCC)  - Apply unna boot; Standing - Apply unna boot

## 2015-10-27 ENCOUNTER — Other Ambulatory Visit: Payer: Self-pay | Admitting: Family Medicine

## 2015-10-27 ENCOUNTER — Encounter: Payer: Self-pay | Admitting: Family Medicine

## 2015-10-27 ENCOUNTER — Ambulatory Visit (INDEPENDENT_AMBULATORY_CARE_PROVIDER_SITE_OTHER): Payer: Medicare Other | Admitting: Family Medicine

## 2015-10-27 VITALS — BP 112/70 | HR 110 | Temp 98.4°F | Resp 18 | Ht <= 58 in | Wt 190.0 lb

## 2015-10-27 DIAGNOSIS — I83009 Varicose veins of unspecified lower extremity with ulcer of unspecified site: Secondary | ICD-10-CM

## 2015-10-27 DIAGNOSIS — L03116 Cellulitis of left lower limb: Secondary | ICD-10-CM

## 2015-10-27 DIAGNOSIS — I878 Other specified disorders of veins: Secondary | ICD-10-CM

## 2015-10-27 DIAGNOSIS — L97909 Non-pressure chronic ulcer of unspecified part of unspecified lower leg with unspecified severity: Principal | ICD-10-CM

## 2015-10-27 MED ORDER — CEPHALEXIN 500 MG PO CAPS: 500.0000 mg | ORAL_CAPSULE | Freq: Four times a day (QID) | ORAL | Status: DC

## 2015-10-27 MED ORDER — INSULIN PEN NEEDLE 31G X 6 MM MISC: Status: DC

## 2015-10-27 NOTE — Telephone Encounter (Signed)
Patient requesting refill. 

## 2015-10-27 NOTE — Progress Notes (Signed)
Name: Karen [name] M Bialecki   MRN: [id number]    DOB: 11-09-1941   Date:10/27/2015       Progress Note  Subjective  Chief Complaint  Chief Complaint  Patient presents with  . Cellulitis    follow up 1 week    HPI  Follow-up of cellulitis and venous stasis disease.  Symptomatically the patient has noted further improvement in the left lower extremity discomfort and swelling. She does have some mild itching from the [employer] boot. There's been no fever or chills.    Past Medical History  Diagnosis Date  . Diabetes mellitus without complication (HCC)   . COPD (chronic obstructive pulmonary disease) (HCC)   . Hypertension   . Hyperlipidemia     Social History  Substance Use Topics  . Smoking status: Never Smoker   . Smokeless tobacco: Not on file  . Alcohol Use: No     Current outpatient prescriptions:  .  B-D INS SYRINGE 0.5CC/31GX5/16 31G X 5/16" 0.5 ML MISC, USE TO INJECT INSULIN TWICE DAILY., Disp: 100 each, Rfl: 6 .  cephALEXin (KEFLEX) 500 MG capsule, Take 1 capsule (500 mg total) by mouth 4 (four) times daily., Disp: 28 capsule, Rfl: 0 .  clopidogrel (PLAVIX) 75 MG tablet, TAKE 1 TABLET BY MOUTH ONCE DAILY., Disp: 30 tablet, Rfl: 11 .  clotrimazole (LOTRIMIN) 1 % cream, , Disp: , Rfl:  .  digoxin (LANOXIN) 0.125 MG tablet, Take by mouth daily. One every other day, Disp: , Rfl:  .  furosemide (LASIX) 20 MG tablet, Take 1 tablet (20 mg total) by mouth daily., Disp: 30 tablet, Rfl: 3 .  HUMALOG KWIKPEN 100 UNIT/ML KiwkPen, INJECT 5 UNITS SUBCUTANEOUSLY AT LUNCH IF BLOOD SUGAR IS 150-200mg /dL, 10 UNITSIF 782-$NFAOZHYQMVHQIONG_EXBMWUXLKGMWNUUVOZDGUYQIHKVQQVZD$$GLOVFIEPPIRJJOAC_ZYSAYTKZSWFUXNATFTDDUKGURKYHCWCB$200-250mg /dL, 15 UNITS IF [id number] mg/dL, AND 20 UNITS IF O, Disp: 3 mL, Rfl: 6 .  HUMULIN 70/30 (70-30) 100 UNIT/ML injection, INJECT 50 UNITS SUBCUTANEOUSLY EVERY MORNING AND 27 UNITS EVERY EVENING., Disp: 20 mL, Rfl: 5 .  Insulin Pen Needle (UNIFINE PENTIPS) 31G X 6 MM MISC, USE TO INJECT INSULIN ONCE DAILY AS DIRECTED., Disp: 90 each, Rfl: 3 .  levothyroxine (SYNTHROID, LEVOTHROID)  150 MCG tablet, TAKE 1 TABLET BY MOUTH ONCE DAILY ON AN EMPTY STOMACH. WAIT 30 MINUTES BEFORE TAKING OTHER MEDS., Disp: 90 tablet, Rfl: 1 .  losartan (COZAAR) 100 MG tablet, TAKE 1 TABLET BY MOUTH ONCE DAILY., Disp: 90 tablet, Rfl: 1 .  pantoprazole (PROTONIX) 40 MG tablet, Take 40 mg by mouth daily., Disp: , Rfl:  .  pravastatin (PRAVACHOL) 40 MG tablet, Take 1 tablet (40 mg total) by mouth daily., Disp: 30 tablet, Rfl: 2 .  predniSONE (DELTASONE) 10 MG tablet, , Disp: , Rfl:  .  raloxifene (EVISTA) 60 MG tablet, TAKE 1 TABLET BY MOUTH ONCE DAILY., Disp: 30 tablet, Rfl: 11 .  VENTOLIN HFA 108 (90 BASE) MCG/ACT inhaler, , Disp: , Rfl:  .  verapamil (VERELAN PM) 240 MG 24 hr capsule, TAKE 1 CAPSULE BY MOUTH ONCE DAILY., Disp: 30 capsule, Rfl: 5  Allergies  Allergen Reactions  . Ace Inhibitors Other (See Comments)    Review of Systems  Constitutional: Negative for fever and chills.  Cardiovascular: Positive for leg swelling.  Skin:       Redness and ulceration of the left lower extremity  Neurological: Negative for headaches.     Objective  Filed Vitals:   10/27/15 1450  BP: 112/70  Pulse: 110  Temp: 98.4 F (36.9 C)  TempSrc: Oral  Resp:  18  Height:  (1.422 m)  Weight: 190 lb (86.183 kg)  SpO2: 94%     Physical Exam  Constitutional:  Obese with nasal cannula with some respiratory difficulty but this is baseline  Skin:  Mild erythema along around a 3 cm venous stasis ulcer of the left lower extremity. The edema has improved with more weeping. Culture returned growing staph aureus which was sensitive to oxacillin. She is currently on cephalexin to which it is sensitive      Assessment & Pla  1. Venous ulcer (HCC) Unna boot removed and reapplied    2. Cellulitis of left lower extremity - cephALEXin (KEFLEX) 500 MG capsule; Take 1 capsule (500 mg total) by mouth 4 (four) times daily.  Dispense: 28 capsule; Refill: 0

## 2015-10-28 DIAGNOSIS — E1165 Type 2 diabetes mellitus with hyperglycemia: Secondary | ICD-10-CM | POA: Diagnosis not present

## 2015-10-28 DIAGNOSIS — Z7902 Long term (current) use of antithrombotics/antiplatelets: Secondary | ICD-10-CM | POA: Diagnosis not present

## 2015-10-28 DIAGNOSIS — R32 Unspecified urinary incontinence: Secondary | ICD-10-CM | POA: Diagnosis not present

## 2015-10-28 DIAGNOSIS — Z9181 History of falling: Secondary | ICD-10-CM | POA: Diagnosis not present

## 2015-10-28 DIAGNOSIS — Z9981 Dependence on supplemental oxygen: Secondary | ICD-10-CM | POA: Diagnosis not present

## 2015-10-28 DIAGNOSIS — Z794 Long term (current) use of insulin: Secondary | ICD-10-CM | POA: Diagnosis not present

## 2015-10-28 DIAGNOSIS — E782 Mixed hyperlipidemia: Secondary | ICD-10-CM | POA: Diagnosis not present

## 2015-10-28 DIAGNOSIS — Z7952 Long term (current) use of systemic steroids: Secondary | ICD-10-CM | POA: Diagnosis not present

## 2015-10-28 DIAGNOSIS — J441 Chronic obstructive pulmonary disease with (acute) exacerbation: Secondary | ICD-10-CM | POA: Diagnosis not present

## 2015-10-28 DIAGNOSIS — I110 Hypertensive heart disease with heart failure: Secondary | ICD-10-CM | POA: Diagnosis not present

## 2015-10-28 DIAGNOSIS — I5032 Chronic diastolic (congestive) heart failure: Secondary | ICD-10-CM | POA: Diagnosis not present

## 2015-10-30 DIAGNOSIS — J449 Chronic obstructive pulmonary disease, unspecified: Secondary | ICD-10-CM | POA: Diagnosis not present

## 2015-11-02 ENCOUNTER — Ambulatory Visit (INDEPENDENT_AMBULATORY_CARE_PROVIDER_SITE_OTHER): Payer: Medicare Other | Admitting: Family Medicine

## 2015-11-02 ENCOUNTER — Encounter: Payer: Self-pay | Admitting: Family Medicine

## 2015-11-02 VITALS — BP 132/70 | HR 110 | Temp 98.1°F | Resp 16 | Ht <= 58 in | Wt 194.5 lb

## 2015-11-02 DIAGNOSIS — J418 Mixed simple and mucopurulent chronic bronchitis: Secondary | ICD-10-CM | POA: Diagnosis not present

## 2015-11-02 DIAGNOSIS — I10 Essential (primary) hypertension: Secondary | ICD-10-CM | POA: Diagnosis not present

## 2015-11-02 DIAGNOSIS — G4733 Obstructive sleep apnea (adult) (pediatric): Secondary | ICD-10-CM | POA: Diagnosis not present

## 2015-11-02 DIAGNOSIS — J45909 Unspecified asthma, uncomplicated: Secondary | ICD-10-CM

## 2015-11-02 DIAGNOSIS — L03818 Cellulitis of other sites: Secondary | ICD-10-CM

## 2015-11-02 DIAGNOSIS — J449 Chronic obstructive pulmonary disease, unspecified: Secondary | ICD-10-CM | POA: Diagnosis not present

## 2015-11-02 DIAGNOSIS — R600 Localized edema: Secondary | ICD-10-CM | POA: Diagnosis not present

## 2015-11-02 DIAGNOSIS — R0602 Shortness of breath: Secondary | ICD-10-CM | POA: Diagnosis not present

## 2015-11-02 DIAGNOSIS — I509 Heart failure, unspecified: Secondary | ICD-10-CM | POA: Diagnosis not present

## 2015-11-02 MED ORDER — PREDNISONE 20 MG PO TABS: 20.0000 mg | ORAL_TABLET | Freq: Every day | ORAL | Status: DC

## 2015-11-02 MED ORDER — AZITHROMYCIN 250 MG PO TABS: ORAL_TABLET | ORAL | Status: DC

## 2015-11-05 DIAGNOSIS — Z7902 Long term (current) use of antithrombotics/antiplatelets: Secondary | ICD-10-CM | POA: Diagnosis not present

## 2015-11-05 DIAGNOSIS — Z794 Long term (current) use of insulin: Secondary | ICD-10-CM | POA: Diagnosis not present

## 2015-11-05 DIAGNOSIS — I5032 Chronic diastolic (congestive) heart failure: Secondary | ICD-10-CM | POA: Diagnosis not present

## 2015-11-05 DIAGNOSIS — E782 Mixed hyperlipidemia: Secondary | ICD-10-CM | POA: Diagnosis not present

## 2015-11-05 DIAGNOSIS — Z9181 History of falling: Secondary | ICD-10-CM | POA: Diagnosis not present

## 2015-11-05 DIAGNOSIS — R32 Unspecified urinary incontinence: Secondary | ICD-10-CM | POA: Diagnosis not present

## 2015-11-05 DIAGNOSIS — I110 Hypertensive heart disease with heart failure: Secondary | ICD-10-CM | POA: Diagnosis not present

## 2015-11-05 DIAGNOSIS — J441 Chronic obstructive pulmonary disease with (acute) exacerbation: Secondary | ICD-10-CM | POA: Diagnosis not present

## 2015-11-05 DIAGNOSIS — E1165 Type 2 diabetes mellitus with hyperglycemia: Secondary | ICD-10-CM | POA: Diagnosis not present

## 2015-11-05 DIAGNOSIS — Z9981 Dependence on supplemental oxygen: Secondary | ICD-10-CM | POA: Diagnosis not present

## 2015-11-05 DIAGNOSIS — Z7952 Long term (current) use of systemic steroids: Secondary | ICD-10-CM | POA: Diagnosis not present

## 2015-11-06 NOTE — Progress Notes (Signed)
Name: Karen [name]   MRN: [id number]    DOB: January 02, 1941   Date:11/06/2015       Progress Note  Subjective  Chief Complaint  Chief Complaint  Patient presents with  . Recurrent Skin Infections    5 day recheck  . Shortness of Breath    wheezing for 3 days  . Transition into care    Dr. Juliann Pares    HPI  Follow-up of lower extremity cellulitis and venous stasis ulceration  Patient continues to note some continued improvement in ulceration of the lower extremity. There is no fever or chills she remains her antibiotic and remains with Unna boot in place.  Asthma exacerbation  Patient has a long-standing history of asthma for which she currently seen a pulmonologist. She is on 10 mg of prednisone as baseline but has had a recent exacerbation with weather changes and wishes to have a steroid burst as is her usual pattern with her pulmonologist for control.  Past Medical History  Diagnosis Date  . Diabetes mellitus without complication (HCC)   . COPD (chronic obstructive pulmonary disease) (HCC)   . Hypertension   . Hyperlipidemia     Social History  Substance Use Topics  . Smoking status: Never Smoker   . Smokeless tobacco: Not on file  . Alcohol Use: No     Current outpatient prescriptions:  .  amLODipine (NORVASC) 10 MG tablet, Take by mouth., Disp: , Rfl:  .  B-D INS SYRINGE 0.5CC/31GX5/16 31G X 5/16" 0.5 ML MISC, USE TO INJECT INSULIN TWICE DAILY., Disp: 100 each, Rfl: 6 .  cephALEXin (KEFLEX) 500 MG capsule, Take 1 capsule (500 mg total) by mouth 4 (four) times daily., Disp: 28 capsule, Rfl: 0 .  clopidogrel (PLAVIX) 75 MG tablet, TAKE 1 TABLET BY MOUTH ONCE DAILY., Disp: 30 tablet, Rfl: 11 .  clotrimazole (LOTRIMIN) 1 % cream, , Disp: , Rfl:  .  digoxin (LANOXIN) 0.125 MG tablet, Take by mouth daily. One every other day, Disp: , Rfl:  .  furosemide (LASIX) 40 MG tablet, Take by mouth., Disp: , Rfl:  .  HUMALOG KWIKPEN 100 UNIT/ML KiwkPen, INJECT 5 UNITS SUBCUTANEOUSLY  AT LUNCH IF BLOOD SUGAR IS 150-200mg /dL, 10 UNITSIF 829-$FAOZHYQMVHQIONGE_XBMWUXLKGMWNUUVOZDGUYQIHKVQQVZDG$$LOVFIEPPIRJJOACZ_YSAYTKZSWFUXNATFTDDUKGURKYHCWCBJ$ /dL, 15 UNITS IF 628-315 mg/dL, AND 20 UNITS IF O, Disp: 3 mL, Rfl: 6 .  HUMULIN 70/30 (70-30) 100 UNIT/ML injection, INJECT 50 UNITS SUBCUTANEOUSLY EVERY MORNING AND 27 UNITS EVERY EVENING., Disp: 20 mL, Rfl: 5 .  Insulin Pen Needle (UNIFINE PENTIPS) 31G X 6 MM MISC, USE TO INJECT INSULIN ONCE DAILY AS DIRECTED., Disp: 90 each, Rfl: 3 .  levothyroxine (SYNTHROID, LEVOTHROID) 150 MCG tablet, TAKE 1 TABLET BY MOUTH ONCE DAILY ON AN EMPTY STOMACH. WAIT 30 MINUTES BEFORE TAKING OTHER MEDS., Disp: 90 tablet, Rfl: 1 .  losartan (COZAAR) 100 MG tablet, TAKE 1 TABLET BY MOUTH ONCE DAILY., Disp: 90 tablet, Rfl: 1 .  pantoprazole (PROTONIX) 40 MG tablet, Take 40 mg by mouth daily., Disp: , Rfl:  .  pravastatin (PRAVACHOL) 40 MG tablet, Take 1 tablet (40 mg total) by mouth daily., Disp: 30 tablet, Rfl: 2 .  raloxifene (EVISTA) 60 MG tablet, TAKE 1 TABLET BY MOUTH ONCE DAILY., Disp: 30 tablet, Rfl: 11 .  VENTOLIN HFA 108 (90 BASE) MCG/ACT inhaler, , Disp: , Rfl:  .  azithromycin (ZITHROMAX) 250 MG tablet, 1 pack as directed, Disp: 6 tablet, Rfl: 0 .  predniSONE (DELTASONE) 20 MG tablet, Take 1 tablet (20 mg total) by mouth daily with  breakfast., Disp: 10 tablet, Rfl: 0  Allergies  Allergen Reactions  . Ace Inhibitors Other (See Comments)    Review of Systems  Constitutional: Negative for fever, chills and weight loss.  HENT: Positive for congestion. Negative for hearing loss, sore throat and tinnitus.   Eyes: Negative for blurred vision, double vision and redness.  Respiratory: Positive for cough, sputum production, shortness of breath and wheezing. Negative for hemoptysis.   Cardiovascular: Positive for leg swelling. Negative for chest pain, palpitations, orthopnea and claudication.       Venous stasis changes including ulceration  Gastrointestinal: Negative for heartburn, nausea, vomiting, diarrhea, constipation and blood in stool.   Genitourinary: Negative for dysuria, urgency, frequency and hematuria.  Musculoskeletal: Negative for myalgias, back pain, joint pain, falls and neck pain.  Skin: Negative for itching.       Patient is stasis ulcerations or extremity  Neurological: Positive for weakness. Negative for dizziness, tingling, tremors, focal weakness, seizures, loss of consciousness and headaches.  Endo/Heme/Allergies: Does not bruise/bleed easily.  Psychiatric/Behavioral: Negative for depression and substance abuse. The patient is not nervous/anxious and does not have insomnia.      Objective  Filed Vitals:   11/02/15 1326  BP: 132/70  Pulse: 110  Temp: 98.1 F (36.7 C)  TempSrc: Oral  Resp: 16  Height: 4\' 8"  (1.422 m)  Weight: 194 lb 8 oz (88.225 kg)  SpO2: 93%     Physical Exam  Constitutional: She is oriented to person, place, and time.  Obese and ML respiratory distress  HENT:  Head: Normocephalic.  Eyes: EOM are normal. Pupils are equal, round, and reactive to light.  Neck: Normal range of motion. No thyromegaly present.  Cardiovascular: Normal rate, regular rhythm and normal heart sounds.   No murmur heard. Pulmonary/Chest: Effort normal and breath sounds normal.  Abdominal: Soft. Bowel sounds are normal.  Musculoskeletal: Normal range of motion. She exhibits no edema.  Neurological: She is alert and oriented to person, place, and time. No cranial nerve deficit. Gait normal.  Skin: No rash noted.  2 cm clearing ulceration of the right lower extremity movement left lower extremity with clearing erythema and edema  Psychiatric: Memory and affect normal.      Assessment & Plan   1. Cellulitis of other specified site Removal reapplied Unna boot - REMV/REVISN BOOT/BODY CAST  2. Mixed simple and mucopurulent chronic bronchitis (HCC) Prednisone boost  3. Asthma with COPD (HCC) Acute exacerbation

## 2015-11-09 ENCOUNTER — Ambulatory Visit (INDEPENDENT_AMBULATORY_CARE_PROVIDER_SITE_OTHER): Payer: Medicare Other | Admitting: Family Medicine

## 2015-11-09 ENCOUNTER — Encounter: Payer: Self-pay | Admitting: Family Medicine

## 2015-11-09 VITALS — BP 128/78 | HR 114 | Temp 98.1°F | Resp 18 | Ht <= 58 in | Wt 187.2 lb

## 2015-11-09 DIAGNOSIS — I878 Other specified disorders of veins: Secondary | ICD-10-CM | POA: Diagnosis not present

## 2015-11-09 DIAGNOSIS — Z8679 Personal history of other diseases of the circulatory system: Secondary | ICD-10-CM | POA: Insufficient documentation

## 2015-11-09 DIAGNOSIS — I83009 Varicose veins of unspecified lower extremity with ulcer of unspecified site: Secondary | ICD-10-CM

## 2015-11-09 DIAGNOSIS — L97909 Non-pressure chronic ulcer of unspecified part of unspecified lower leg with unspecified severity: Principal | ICD-10-CM

## 2015-11-09 NOTE — Progress Notes (Signed)
Name: Karen [name]   MRN: [id number]    DOB: [birth year]   Date:11/09/2015       Progress Note  Subjective  Chief Complaint  Chief Complaint  Patient presents with  . Cellulitis     1 week follow up visit    HPI  Patient presents for one-week recheck of venous stasis ulcer and replacement of the Unna boot. He was taken off actually 3 or 4 days ago by home care nurse. She still has some mild discomfort no fever or chills.  Past Medical History  Diagnosis Date  . Diabetes mellitus without complication (HCC)   . COPD (chronic obstructive pulmonary disease) (HCC)   . Hypertension   . Hyperlipidemia     Social History  Substance Use Topics  . Smoking status: Never Smoker   . Smokeless tobacco: Not on file  . Alcohol Use: No     Current outpatient prescriptions:  .  amLODipine (NORVASC) 10 MG tablet, Take by mouth., Disp: , Rfl:  .  azithromycin (ZITHROMAX) 250 MG tablet, 1 pack as directed, Disp: 6 tablet, Rfl: 0 .  B-D INS SYRINGE 0.5CC/31GX5/16 31G X 5/16" 0.5 ML MISC, USE TO INJECT INSULIN TWICE DAILY., Disp: 100 each, Rfl: 6 .  cephALEXin (KEFLEX) 500 MG capsule, Take 1 capsule (500 mg total) by mouth 4 (four) times daily., Disp: 28 capsule, Rfl: 0 .  clopidogrel (PLAVIX) 75 MG tablet, TAKE 1 TABLET BY MOUTH ONCE DAILY., Disp: 30 tablet, Rfl: 11 .  clotrimazole (LOTRIMIN) 1 % cream, , Disp: , Rfl:  .  digoxin (LANOXIN) 0.125 MG tablet, Take by mouth daily. One every other day, Disp: , Rfl:  .  furosemide (LASIX) 40 MG tablet, Take by mouth., Disp: , Rfl:  .  HUMALOG KWIKPEN 100 UNIT/ML KiwkPen, INJECT 5 UNITS SUBCUTANEOUSLY AT LUNCH IF BLOOD SUGAR IS 150-200mg /dL, 10 UNITSIF 119-$JYNWGNFAOZHYQMVH_QIONGEXBMWUXLKGMWNUUVOZDGUYQIHKV$$QQVZDGLOVFIEPPIR_JJOACZYSAYTKZSWFUXNATFTDDUKGURKY$200-250mg /dL, 15 UNITS IF 706-237250-300 mg/dL, AND 20 UNITS IF O, Disp: 3 mL, Rfl: 6 .  HUMULIN 70/30 (70-30) 100 UNIT/ML injection, INJECT 50 UNITS SUBCUTANEOUSLY EVERY MORNING AND 27 UNITS EVERY EVENING., Disp: 20 mL, Rfl: 5 .  Insulin Pen Needle (UNIFINE PENTIPS) 31G X 6 MM MISC, USE TO INJECT INSULIN ONCE  DAILY AS DIRECTED., Disp: 90 each, Rfl: 3 .  levothyroxine (SYNTHROID, LEVOTHROID) 150 MCG tablet, TAKE 1 TABLET BY MOUTH ONCE DAILY ON AN EMPTY STOMACH. WAIT 30 MINUTES BEFORE TAKING OTHER MEDS., Disp: 90 tablet, Rfl: 1 .  losartan (COZAAR) 100 MG tablet, TAKE 1 TABLET BY MOUTH ONCE DAILY., Disp: 90 tablet, Rfl: 1 .  pantoprazole (PROTONIX) 40 MG tablet, Take 40 mg by mouth daily., Disp: , Rfl:  .  pravastatin (PRAVACHOL) 40 MG tablet, Take 1 tablet (40 mg total) by mouth daily., Disp: 30 tablet, Rfl: 2 .  predniSONE (DELTASONE) 20 MG tablet, Take 1 tablet (20 mg total) by mouth daily with breakfast., Disp: 10 tablet, Rfl: 0 .  raloxifene (EVISTA) 60 MG tablet, TAKE 1 TABLET BY MOUTH ONCE DAILY., Disp: 30 tablet, Rfl: 11 .  VENTOLIN HFA 108 (90 BASE) MCG/ACT inhaler, , Disp: , Rfl:   Allergies  Allergen Reactions  . Ace Inhibitors Other (See Comments)    Review of Systems  Cardiovascular: Positive for leg swelling.  Skin:       Venous stasis ulcer of the left anterior tibial area measuring about 2 cm in diameter with minimal surrounding erythema or drainage. There is trace 1+ pretibial edema     Objective  Filed Vitals:  11/09/15 1204  BP: 128/78  Pulse: 114  Temp: 98.1 F (36.7 C)  Resp: 18  Height:  (1.422 m)  Weight: 187 lb 3 oz (84.908 kg)  SpO2: 95%     Physical Exam    Skin:       Venous stasis ulcer of the left anterior tibial area measuring about 2 cm in diameter with minimal surrounding erythema or drainage. There is trace 1+ pretibial edema   Assessment & Plan  1. Venous ulcer (HCC)  - Apply unna boot; Standing - Ambulatory referral to Vascular Surgery - Apply unna boot

## 2015-11-11 DIAGNOSIS — M7989 Other specified soft tissue disorders: Secondary | ICD-10-CM | POA: Diagnosis not present

## 2015-11-16 ENCOUNTER — Other Ambulatory Visit: Payer: Self-pay | Admitting: Family Medicine

## 2015-11-17 DIAGNOSIS — E782 Mixed hyperlipidemia: Secondary | ICD-10-CM | POA: Diagnosis not present

## 2015-11-17 DIAGNOSIS — Z9181 History of falling: Secondary | ICD-10-CM | POA: Diagnosis not present

## 2015-11-17 DIAGNOSIS — I5032 Chronic diastolic (congestive) heart failure: Secondary | ICD-10-CM | POA: Diagnosis not present

## 2015-11-17 DIAGNOSIS — J441 Chronic obstructive pulmonary disease with (acute) exacerbation: Secondary | ICD-10-CM | POA: Diagnosis not present

## 2015-11-17 DIAGNOSIS — E1165 Type 2 diabetes mellitus with hyperglycemia: Secondary | ICD-10-CM | POA: Diagnosis not present

## 2015-11-17 DIAGNOSIS — Z9981 Dependence on supplemental oxygen: Secondary | ICD-10-CM | POA: Diagnosis not present

## 2015-11-17 DIAGNOSIS — Z794 Long term (current) use of insulin: Secondary | ICD-10-CM | POA: Diagnosis not present

## 2015-11-17 DIAGNOSIS — Z7952 Long term (current) use of systemic steroids: Secondary | ICD-10-CM | POA: Diagnosis not present

## 2015-11-17 DIAGNOSIS — Z7902 Long term (current) use of antithrombotics/antiplatelets: Secondary | ICD-10-CM | POA: Diagnosis not present

## 2015-11-17 DIAGNOSIS — R32 Unspecified urinary incontinence: Secondary | ICD-10-CM | POA: Diagnosis not present

## 2015-11-17 DIAGNOSIS — I110 Hypertensive heart disease with heart failure: Secondary | ICD-10-CM | POA: Diagnosis not present

## 2015-11-20 DIAGNOSIS — L97209 Non-pressure chronic ulcer of unspecified calf with unspecified severity: Secondary | ICD-10-CM | POA: Diagnosis not present

## 2015-11-25 ENCOUNTER — Other Ambulatory Visit: Payer: Self-pay | Admitting: Family Medicine

## 2015-11-27 DIAGNOSIS — J441 Chronic obstructive pulmonary disease with (acute) exacerbation: Secondary | ICD-10-CM | POA: Diagnosis not present

## 2015-11-27 DIAGNOSIS — E1165 Type 2 diabetes mellitus with hyperglycemia: Secondary | ICD-10-CM | POA: Diagnosis not present

## 2015-11-27 DIAGNOSIS — E782 Mixed hyperlipidemia: Secondary | ICD-10-CM | POA: Diagnosis not present

## 2015-11-27 DIAGNOSIS — L97209 Non-pressure chronic ulcer of unspecified calf with unspecified severity: Secondary | ICD-10-CM | POA: Diagnosis not present

## 2015-11-27 DIAGNOSIS — Z7902 Long term (current) use of antithrombotics/antiplatelets: Secondary | ICD-10-CM | POA: Diagnosis not present

## 2015-11-27 DIAGNOSIS — I110 Hypertensive heart disease with heart failure: Secondary | ICD-10-CM | POA: Diagnosis not present

## 2015-11-27 DIAGNOSIS — Z7952 Long term (current) use of systemic steroids: Secondary | ICD-10-CM | POA: Diagnosis not present

## 2015-11-27 DIAGNOSIS — R32 Unspecified urinary incontinence: Secondary | ICD-10-CM | POA: Diagnosis not present

## 2015-11-27 DIAGNOSIS — Z794 Long term (current) use of insulin: Secondary | ICD-10-CM | POA: Diagnosis not present

## 2015-11-27 DIAGNOSIS — Z9981 Dependence on supplemental oxygen: Secondary | ICD-10-CM | POA: Diagnosis not present

## 2015-11-27 DIAGNOSIS — Z9181 History of falling: Secondary | ICD-10-CM | POA: Diagnosis not present

## 2015-11-27 DIAGNOSIS — I5032 Chronic diastolic (congestive) heart failure: Secondary | ICD-10-CM | POA: Diagnosis not present

## 2015-11-29 DIAGNOSIS — J449 Chronic obstructive pulmonary disease, unspecified: Secondary | ICD-10-CM | POA: Diagnosis not present

## 2015-12-01 ENCOUNTER — Other Ambulatory Visit: Payer: Self-pay | Admitting: Family Medicine

## 2015-12-02 DIAGNOSIS — I70242 Atherosclerosis of native arteries of left leg with ulceration of calf: Secondary | ICD-10-CM | POA: Diagnosis not present

## 2015-12-07 DIAGNOSIS — R238 Other skin changes: Secondary | ICD-10-CM | POA: Diagnosis not present

## 2015-12-07 DIAGNOSIS — Z7902 Long term (current) use of antithrombotics/antiplatelets: Secondary | ICD-10-CM | POA: Diagnosis not present

## 2015-12-07 DIAGNOSIS — I110 Hypertensive heart disease with heart failure: Secondary | ICD-10-CM | POA: Diagnosis not present

## 2015-12-07 DIAGNOSIS — J441 Chronic obstructive pulmonary disease with (acute) exacerbation: Secondary | ICD-10-CM | POA: Diagnosis not present

## 2015-12-07 DIAGNOSIS — Z7952 Long term (current) use of systemic steroids: Secondary | ICD-10-CM | POA: Diagnosis not present

## 2015-12-07 DIAGNOSIS — Z9181 History of falling: Secondary | ICD-10-CM | POA: Diagnosis not present

## 2015-12-07 DIAGNOSIS — E119 Type 2 diabetes mellitus without complications: Secondary | ICD-10-CM | POA: Diagnosis not present

## 2015-12-07 DIAGNOSIS — Z794 Long term (current) use of insulin: Secondary | ICD-10-CM | POA: Diagnosis not present

## 2015-12-07 DIAGNOSIS — Z4800 Encounter for change or removal of nonsurgical wound dressing: Secondary | ICD-10-CM | POA: Diagnosis not present

## 2015-12-07 DIAGNOSIS — I5032 Chronic diastolic (congestive) heart failure: Secondary | ICD-10-CM | POA: Diagnosis not present

## 2015-12-07 DIAGNOSIS — E782 Mixed hyperlipidemia: Secondary | ICD-10-CM | POA: Diagnosis not present

## 2015-12-10 DIAGNOSIS — J449 Chronic obstructive pulmonary disease, unspecified: Secondary | ICD-10-CM | POA: Diagnosis not present

## 2015-12-11 DIAGNOSIS — I509 Heart failure, unspecified: Secondary | ICD-10-CM | POA: Diagnosis not present

## 2015-12-11 DIAGNOSIS — R0602 Shortness of breath: Secondary | ICD-10-CM | POA: Diagnosis not present

## 2015-12-11 DIAGNOSIS — M7989 Other specified soft tissue disorders: Secondary | ICD-10-CM | POA: Diagnosis not present

## 2015-12-11 DIAGNOSIS — E119 Type 2 diabetes mellitus without complications: Secondary | ICD-10-CM | POA: Diagnosis not present

## 2015-12-11 DIAGNOSIS — R600 Localized edema: Secondary | ICD-10-CM | POA: Diagnosis not present

## 2015-12-11 DIAGNOSIS — I70242 Atherosclerosis of native arteries of left leg with ulceration of calf: Secondary | ICD-10-CM | POA: Diagnosis not present

## 2015-12-11 DIAGNOSIS — G4733 Obstructive sleep apnea (adult) (pediatric): Secondary | ICD-10-CM | POA: Diagnosis not present

## 2015-12-11 DIAGNOSIS — I10 Essential (primary) hypertension: Secondary | ICD-10-CM | POA: Diagnosis not present

## 2015-12-17 ENCOUNTER — Other Ambulatory Visit: Payer: Self-pay | Admitting: Family Medicine

## 2015-12-19 DIAGNOSIS — J441 Chronic obstructive pulmonary disease with (acute) exacerbation: Secondary | ICD-10-CM | POA: Diagnosis not present

## 2015-12-19 DIAGNOSIS — Z4800 Encounter for change or removal of nonsurgical wound dressing: Secondary | ICD-10-CM | POA: Diagnosis not present

## 2015-12-19 DIAGNOSIS — I110 Hypertensive heart disease with heart failure: Secondary | ICD-10-CM | POA: Diagnosis not present

## 2015-12-19 DIAGNOSIS — Z7952 Long term (current) use of systemic steroids: Secondary | ICD-10-CM | POA: Diagnosis not present

## 2015-12-19 DIAGNOSIS — Z9181 History of falling: Secondary | ICD-10-CM | POA: Diagnosis not present

## 2015-12-19 DIAGNOSIS — Z794 Long term (current) use of insulin: Secondary | ICD-10-CM | POA: Diagnosis not present

## 2015-12-19 DIAGNOSIS — I5032 Chronic diastolic (congestive) heart failure: Secondary | ICD-10-CM | POA: Diagnosis not present

## 2015-12-19 DIAGNOSIS — E119 Type 2 diabetes mellitus without complications: Secondary | ICD-10-CM | POA: Diagnosis not present

## 2015-12-19 DIAGNOSIS — E782 Mixed hyperlipidemia: Secondary | ICD-10-CM | POA: Diagnosis not present

## 2015-12-19 DIAGNOSIS — R238 Other skin changes: Secondary | ICD-10-CM | POA: Diagnosis not present

## 2015-12-19 DIAGNOSIS — Z7902 Long term (current) use of antithrombotics/antiplatelets: Secondary | ICD-10-CM | POA: Diagnosis not present

## 2015-12-25 DIAGNOSIS — S81802A Unspecified open wound, left lower leg, initial encounter: Secondary | ICD-10-CM | POA: Diagnosis not present

## 2015-12-30 DIAGNOSIS — E782 Mixed hyperlipidemia: Secondary | ICD-10-CM | POA: Diagnosis not present

## 2015-12-30 DIAGNOSIS — Z7952 Long term (current) use of systemic steroids: Secondary | ICD-10-CM | POA: Diagnosis not present

## 2015-12-30 DIAGNOSIS — J449 Chronic obstructive pulmonary disease, unspecified: Secondary | ICD-10-CM | POA: Diagnosis not present

## 2015-12-30 DIAGNOSIS — J441 Chronic obstructive pulmonary disease with (acute) exacerbation: Secondary | ICD-10-CM | POA: Diagnosis not present

## 2015-12-30 DIAGNOSIS — Z9181 History of falling: Secondary | ICD-10-CM | POA: Diagnosis not present

## 2015-12-30 DIAGNOSIS — Z4800 Encounter for change or removal of nonsurgical wound dressing: Secondary | ICD-10-CM | POA: Diagnosis not present

## 2015-12-30 DIAGNOSIS — I110 Hypertensive heart disease with heart failure: Secondary | ICD-10-CM | POA: Diagnosis not present

## 2015-12-30 DIAGNOSIS — R238 Other skin changes: Secondary | ICD-10-CM | POA: Diagnosis not present

## 2015-12-30 DIAGNOSIS — Z794 Long term (current) use of insulin: Secondary | ICD-10-CM | POA: Diagnosis not present

## 2015-12-30 DIAGNOSIS — E119 Type 2 diabetes mellitus without complications: Secondary | ICD-10-CM | POA: Diagnosis not present

## 2015-12-30 DIAGNOSIS — Z7902 Long term (current) use of antithrombotics/antiplatelets: Secondary | ICD-10-CM | POA: Diagnosis not present

## 2015-12-30 DIAGNOSIS — I5032 Chronic diastolic (congestive) heart failure: Secondary | ICD-10-CM | POA: Diagnosis not present

## 2016-01-06 DIAGNOSIS — J441 Chronic obstructive pulmonary disease with (acute) exacerbation: Secondary | ICD-10-CM | POA: Diagnosis not present

## 2016-01-06 DIAGNOSIS — R0602 Shortness of breath: Secondary | ICD-10-CM | POA: Diagnosis not present

## 2016-01-06 DIAGNOSIS — E119 Type 2 diabetes mellitus without complications: Secondary | ICD-10-CM | POA: Diagnosis not present

## 2016-01-06 DIAGNOSIS — Z4800 Encounter for change or removal of nonsurgical wound dressing: Secondary | ICD-10-CM | POA: Diagnosis not present

## 2016-01-06 DIAGNOSIS — M6281 Muscle weakness (generalized): Secondary | ICD-10-CM | POA: Diagnosis not present

## 2016-01-06 DIAGNOSIS — Z7952 Long term (current) use of systemic steroids: Secondary | ICD-10-CM | POA: Diagnosis not present

## 2016-01-06 DIAGNOSIS — Z9181 History of falling: Secondary | ICD-10-CM | POA: Diagnosis not present

## 2016-01-06 DIAGNOSIS — Z794 Long term (current) use of insulin: Secondary | ICD-10-CM | POA: Diagnosis not present

## 2016-01-06 DIAGNOSIS — Z7902 Long term (current) use of antithrombotics/antiplatelets: Secondary | ICD-10-CM | POA: Diagnosis not present

## 2016-01-06 DIAGNOSIS — R269 Unspecified abnormalities of gait and mobility: Secondary | ICD-10-CM | POA: Diagnosis not present

## 2016-01-06 DIAGNOSIS — R238 Other skin changes: Secondary | ICD-10-CM | POA: Diagnosis not present

## 2016-01-06 DIAGNOSIS — I5032 Chronic diastolic (congestive) heart failure: Secondary | ICD-10-CM | POA: Diagnosis not present

## 2016-01-06 DIAGNOSIS — E782 Mixed hyperlipidemia: Secondary | ICD-10-CM | POA: Diagnosis not present

## 2016-01-06 DIAGNOSIS — I110 Hypertensive heart disease with heart failure: Secondary | ICD-10-CM | POA: Diagnosis not present

## 2016-01-06 DIAGNOSIS — J449 Chronic obstructive pulmonary disease, unspecified: Secondary | ICD-10-CM | POA: Diagnosis not present

## 2016-01-11 ENCOUNTER — Other Ambulatory Visit: Payer: Self-pay | Admitting: Family Medicine

## 2016-01-13 DIAGNOSIS — E782 Mixed hyperlipidemia: Secondary | ICD-10-CM | POA: Diagnosis not present

## 2016-01-13 DIAGNOSIS — E119 Type 2 diabetes mellitus without complications: Secondary | ICD-10-CM | POA: Diagnosis not present

## 2016-01-13 DIAGNOSIS — Z9181 History of falling: Secondary | ICD-10-CM | POA: Diagnosis not present

## 2016-01-13 DIAGNOSIS — Z7952 Long term (current) use of systemic steroids: Secondary | ICD-10-CM | POA: Diagnosis not present

## 2016-01-13 DIAGNOSIS — I5032 Chronic diastolic (congestive) heart failure: Secondary | ICD-10-CM | POA: Diagnosis not present

## 2016-01-13 DIAGNOSIS — Z7902 Long term (current) use of antithrombotics/antiplatelets: Secondary | ICD-10-CM | POA: Diagnosis not present

## 2016-01-13 DIAGNOSIS — Z794 Long term (current) use of insulin: Secondary | ICD-10-CM | POA: Diagnosis not present

## 2016-01-13 DIAGNOSIS — R238 Other skin changes: Secondary | ICD-10-CM | POA: Diagnosis not present

## 2016-01-13 DIAGNOSIS — Z4800 Encounter for change or removal of nonsurgical wound dressing: Secondary | ICD-10-CM | POA: Diagnosis not present

## 2016-01-13 DIAGNOSIS — J441 Chronic obstructive pulmonary disease with (acute) exacerbation: Secondary | ICD-10-CM | POA: Diagnosis not present

## 2016-01-13 DIAGNOSIS — I110 Hypertensive heart disease with heart failure: Secondary | ICD-10-CM | POA: Diagnosis not present

## 2016-01-18 DIAGNOSIS — E119 Type 2 diabetes mellitus without complications: Secondary | ICD-10-CM | POA: Diagnosis not present

## 2016-01-18 DIAGNOSIS — R238 Other skin changes: Secondary | ICD-10-CM | POA: Diagnosis not present

## 2016-01-18 DIAGNOSIS — E782 Mixed hyperlipidemia: Secondary | ICD-10-CM | POA: Diagnosis not present

## 2016-01-18 DIAGNOSIS — J441 Chronic obstructive pulmonary disease with (acute) exacerbation: Secondary | ICD-10-CM | POA: Diagnosis not present

## 2016-01-18 DIAGNOSIS — Z7902 Long term (current) use of antithrombotics/antiplatelets: Secondary | ICD-10-CM | POA: Diagnosis not present

## 2016-01-18 DIAGNOSIS — I5032 Chronic diastolic (congestive) heart failure: Secondary | ICD-10-CM | POA: Diagnosis not present

## 2016-01-18 DIAGNOSIS — I110 Hypertensive heart disease with heart failure: Secondary | ICD-10-CM | POA: Diagnosis not present

## 2016-01-18 DIAGNOSIS — Z7952 Long term (current) use of systemic steroids: Secondary | ICD-10-CM | POA: Diagnosis not present

## 2016-01-18 DIAGNOSIS — Z4800 Encounter for change or removal of nonsurgical wound dressing: Secondary | ICD-10-CM | POA: Diagnosis not present

## 2016-01-18 DIAGNOSIS — Z794 Long term (current) use of insulin: Secondary | ICD-10-CM | POA: Diagnosis not present

## 2016-01-18 DIAGNOSIS — Z9181 History of falling: Secondary | ICD-10-CM | POA: Diagnosis not present

## 2016-01-22 ENCOUNTER — Other Ambulatory Visit: Payer: Self-pay | Admitting: Family Medicine

## 2016-01-22 DIAGNOSIS — E785 Hyperlipidemia, unspecified: Secondary | ICD-10-CM | POA: Diagnosis not present

## 2016-01-22 DIAGNOSIS — I70242 Atherosclerosis of native arteries of left leg with ulceration of calf: Secondary | ICD-10-CM | POA: Diagnosis not present

## 2016-01-22 DIAGNOSIS — E119 Type 2 diabetes mellitus without complications: Secondary | ICD-10-CM | POA: Diagnosis not present

## 2016-01-22 MED ORDER — PRAVASTATIN SODIUM 40 MG PO TABS: 40.0000 mg | ORAL_TABLET | Freq: Every day | ORAL | Status: DC

## 2016-01-23 ENCOUNTER — Other Ambulatory Visit: Payer: Self-pay | Admitting: Family Medicine

## 2016-01-25 DIAGNOSIS — Z794 Long term (current) use of insulin: Secondary | ICD-10-CM | POA: Diagnosis not present

## 2016-01-25 DIAGNOSIS — E119 Type 2 diabetes mellitus without complications: Secondary | ICD-10-CM | POA: Diagnosis not present

## 2016-01-25 DIAGNOSIS — Z7902 Long term (current) use of antithrombotics/antiplatelets: Secondary | ICD-10-CM | POA: Diagnosis not present

## 2016-01-25 DIAGNOSIS — Z9181 History of falling: Secondary | ICD-10-CM | POA: Diagnosis not present

## 2016-01-25 DIAGNOSIS — I5032 Chronic diastolic (congestive) heart failure: Secondary | ICD-10-CM | POA: Diagnosis not present

## 2016-01-25 DIAGNOSIS — Z4800 Encounter for change or removal of nonsurgical wound dressing: Secondary | ICD-10-CM | POA: Diagnosis not present

## 2016-01-25 DIAGNOSIS — E782 Mixed hyperlipidemia: Secondary | ICD-10-CM | POA: Diagnosis not present

## 2016-01-25 DIAGNOSIS — I110 Hypertensive heart disease with heart failure: Secondary | ICD-10-CM | POA: Diagnosis not present

## 2016-01-25 DIAGNOSIS — J441 Chronic obstructive pulmonary disease with (acute) exacerbation: Secondary | ICD-10-CM | POA: Diagnosis not present

## 2016-01-25 DIAGNOSIS — Z7952 Long term (current) use of systemic steroids: Secondary | ICD-10-CM | POA: Diagnosis not present

## 2016-01-25 DIAGNOSIS — R238 Other skin changes: Secondary | ICD-10-CM | POA: Diagnosis not present

## 2016-01-30 DIAGNOSIS — J449 Chronic obstructive pulmonary disease, unspecified: Secondary | ICD-10-CM | POA: Diagnosis not present

## 2016-02-03 DIAGNOSIS — J449 Chronic obstructive pulmonary disease, unspecified: Secondary | ICD-10-CM | POA: Diagnosis not present

## 2016-02-05 DIAGNOSIS — I110 Hypertensive heart disease with heart failure: Secondary | ICD-10-CM | POA: Diagnosis not present

## 2016-02-05 DIAGNOSIS — Z4800 Encounter for change or removal of nonsurgical wound dressing: Secondary | ICD-10-CM | POA: Diagnosis not present

## 2016-02-05 DIAGNOSIS — R238 Other skin changes: Secondary | ICD-10-CM | POA: Diagnosis not present

## 2016-02-05 DIAGNOSIS — J441 Chronic obstructive pulmonary disease with (acute) exacerbation: Secondary | ICD-10-CM | POA: Diagnosis not present

## 2016-02-05 DIAGNOSIS — Z9181 History of falling: Secondary | ICD-10-CM | POA: Diagnosis not present

## 2016-02-05 DIAGNOSIS — E782 Mixed hyperlipidemia: Secondary | ICD-10-CM | POA: Diagnosis not present

## 2016-02-05 DIAGNOSIS — I5032 Chronic diastolic (congestive) heart failure: Secondary | ICD-10-CM | POA: Diagnosis not present

## 2016-02-05 DIAGNOSIS — Z7902 Long term (current) use of antithrombotics/antiplatelets: Secondary | ICD-10-CM | POA: Diagnosis not present

## 2016-02-05 DIAGNOSIS — E119 Type 2 diabetes mellitus without complications: Secondary | ICD-10-CM | POA: Diagnosis not present

## 2016-02-05 DIAGNOSIS — Z7952 Long term (current) use of systemic steroids: Secondary | ICD-10-CM | POA: Diagnosis not present

## 2016-02-05 DIAGNOSIS — Z794 Long term (current) use of insulin: Secondary | ICD-10-CM | POA: Diagnosis not present

## 2016-02-09 ENCOUNTER — Other Ambulatory Visit: Payer: Self-pay | Admitting: Family Medicine

## 2016-02-10 DIAGNOSIS — I5032 Chronic diastolic (congestive) heart failure: Secondary | ICD-10-CM | POA: Diagnosis not present

## 2016-02-10 DIAGNOSIS — Z7902 Long term (current) use of antithrombotics/antiplatelets: Secondary | ICD-10-CM | POA: Diagnosis not present

## 2016-02-10 DIAGNOSIS — Z7952 Long term (current) use of systemic steroids: Secondary | ICD-10-CM | POA: Diagnosis not present

## 2016-02-10 DIAGNOSIS — E782 Mixed hyperlipidemia: Secondary | ICD-10-CM | POA: Diagnosis not present

## 2016-02-10 DIAGNOSIS — Z794 Long term (current) use of insulin: Secondary | ICD-10-CM | POA: Diagnosis not present

## 2016-02-10 DIAGNOSIS — R238 Other skin changes: Secondary | ICD-10-CM | POA: Diagnosis not present

## 2016-02-10 DIAGNOSIS — I110 Hypertensive heart disease with heart failure: Secondary | ICD-10-CM | POA: Diagnosis not present

## 2016-02-10 DIAGNOSIS — Z9181 History of falling: Secondary | ICD-10-CM | POA: Diagnosis not present

## 2016-02-10 DIAGNOSIS — E119 Type 2 diabetes mellitus without complications: Secondary | ICD-10-CM | POA: Diagnosis not present

## 2016-02-10 DIAGNOSIS — Z4800 Encounter for change or removal of nonsurgical wound dressing: Secondary | ICD-10-CM | POA: Diagnosis not present

## 2016-02-10 DIAGNOSIS — J441 Chronic obstructive pulmonary disease with (acute) exacerbation: Secondary | ICD-10-CM | POA: Diagnosis not present

## 2016-02-13 DIAGNOSIS — Z9181 History of falling: Secondary | ICD-10-CM | POA: Diagnosis not present

## 2016-02-13 DIAGNOSIS — Z7902 Long term (current) use of antithrombotics/antiplatelets: Secondary | ICD-10-CM | POA: Diagnosis not present

## 2016-02-13 DIAGNOSIS — J441 Chronic obstructive pulmonary disease with (acute) exacerbation: Secondary | ICD-10-CM | POA: Diagnosis not present

## 2016-02-13 DIAGNOSIS — R238 Other skin changes: Secondary | ICD-10-CM | POA: Diagnosis not present

## 2016-02-13 DIAGNOSIS — E119 Type 2 diabetes mellitus without complications: Secondary | ICD-10-CM | POA: Diagnosis not present

## 2016-02-13 DIAGNOSIS — Z7952 Long term (current) use of systemic steroids: Secondary | ICD-10-CM | POA: Diagnosis not present

## 2016-02-13 DIAGNOSIS — I5032 Chronic diastolic (congestive) heart failure: Secondary | ICD-10-CM | POA: Diagnosis not present

## 2016-02-13 DIAGNOSIS — Z4800 Encounter for change or removal of nonsurgical wound dressing: Secondary | ICD-10-CM | POA: Diagnosis not present

## 2016-02-13 DIAGNOSIS — E782 Mixed hyperlipidemia: Secondary | ICD-10-CM | POA: Diagnosis not present

## 2016-02-13 DIAGNOSIS — Z794 Long term (current) use of insulin: Secondary | ICD-10-CM | POA: Diagnosis not present

## 2016-02-13 DIAGNOSIS — I110 Hypertensive heart disease with heart failure: Secondary | ICD-10-CM | POA: Diagnosis not present

## 2016-02-17 DIAGNOSIS — E782 Mixed hyperlipidemia: Secondary | ICD-10-CM | POA: Diagnosis not present

## 2016-02-17 DIAGNOSIS — Z7952 Long term (current) use of systemic steroids: Secondary | ICD-10-CM | POA: Diagnosis not present

## 2016-02-17 DIAGNOSIS — Z9181 History of falling: Secondary | ICD-10-CM | POA: Diagnosis not present

## 2016-02-17 DIAGNOSIS — J441 Chronic obstructive pulmonary disease with (acute) exacerbation: Secondary | ICD-10-CM | POA: Diagnosis not present

## 2016-02-17 DIAGNOSIS — E119 Type 2 diabetes mellitus without complications: Secondary | ICD-10-CM | POA: Diagnosis not present

## 2016-02-17 DIAGNOSIS — I110 Hypertensive heart disease with heart failure: Secondary | ICD-10-CM | POA: Diagnosis not present

## 2016-02-17 DIAGNOSIS — Z7902 Long term (current) use of antithrombotics/antiplatelets: Secondary | ICD-10-CM | POA: Diagnosis not present

## 2016-02-17 DIAGNOSIS — R238 Other skin changes: Secondary | ICD-10-CM | POA: Diagnosis not present

## 2016-02-17 DIAGNOSIS — Z794 Long term (current) use of insulin: Secondary | ICD-10-CM | POA: Diagnosis not present

## 2016-02-17 DIAGNOSIS — I5032 Chronic diastolic (congestive) heart failure: Secondary | ICD-10-CM | POA: Diagnosis not present

## 2016-02-17 DIAGNOSIS — Z4800 Encounter for change or removal of nonsurgical wound dressing: Secondary | ICD-10-CM | POA: Diagnosis not present

## 2016-02-24 DIAGNOSIS — I110 Hypertensive heart disease with heart failure: Secondary | ICD-10-CM | POA: Diagnosis not present

## 2016-02-24 DIAGNOSIS — Z4800 Encounter for change or removal of nonsurgical wound dressing: Secondary | ICD-10-CM | POA: Diagnosis not present

## 2016-02-24 DIAGNOSIS — Z794 Long term (current) use of insulin: Secondary | ICD-10-CM | POA: Diagnosis not present

## 2016-02-24 DIAGNOSIS — E782 Mixed hyperlipidemia: Secondary | ICD-10-CM | POA: Diagnosis not present

## 2016-02-24 DIAGNOSIS — R238 Other skin changes: Secondary | ICD-10-CM | POA: Diagnosis not present

## 2016-02-24 DIAGNOSIS — Z7952 Long term (current) use of systemic steroids: Secondary | ICD-10-CM | POA: Diagnosis not present

## 2016-02-24 DIAGNOSIS — Z9181 History of falling: Secondary | ICD-10-CM | POA: Diagnosis not present

## 2016-02-24 DIAGNOSIS — I5032 Chronic diastolic (congestive) heart failure: Secondary | ICD-10-CM | POA: Diagnosis not present

## 2016-02-24 DIAGNOSIS — J441 Chronic obstructive pulmonary disease with (acute) exacerbation: Secondary | ICD-10-CM | POA: Diagnosis not present

## 2016-02-24 DIAGNOSIS — Z7902 Long term (current) use of antithrombotics/antiplatelets: Secondary | ICD-10-CM | POA: Diagnosis not present

## 2016-02-24 DIAGNOSIS — E119 Type 2 diabetes mellitus without complications: Secondary | ICD-10-CM | POA: Diagnosis not present

## 2016-02-25 ENCOUNTER — Other Ambulatory Visit: Payer: Self-pay | Admitting: Family Medicine

## 2016-02-27 DIAGNOSIS — I5032 Chronic diastolic (congestive) heart failure: Secondary | ICD-10-CM | POA: Diagnosis not present

## 2016-02-27 DIAGNOSIS — E119 Type 2 diabetes mellitus without complications: Secondary | ICD-10-CM | POA: Diagnosis not present

## 2016-02-27 DIAGNOSIS — J449 Chronic obstructive pulmonary disease, unspecified: Secondary | ICD-10-CM | POA: Diagnosis not present

## 2016-02-27 DIAGNOSIS — E782 Mixed hyperlipidemia: Secondary | ICD-10-CM | POA: Diagnosis not present

## 2016-02-27 DIAGNOSIS — R238 Other skin changes: Secondary | ICD-10-CM | POA: Diagnosis not present

## 2016-02-27 DIAGNOSIS — I110 Hypertensive heart disease with heart failure: Secondary | ICD-10-CM | POA: Diagnosis not present

## 2016-02-27 DIAGNOSIS — Z4800 Encounter for change or removal of nonsurgical wound dressing: Secondary | ICD-10-CM | POA: Diagnosis not present

## 2016-02-27 DIAGNOSIS — Z7902 Long term (current) use of antithrombotics/antiplatelets: Secondary | ICD-10-CM | POA: Diagnosis not present

## 2016-02-27 DIAGNOSIS — Z794 Long term (current) use of insulin: Secondary | ICD-10-CM | POA: Diagnosis not present

## 2016-02-27 DIAGNOSIS — Z9181 History of falling: Secondary | ICD-10-CM | POA: Diagnosis not present

## 2016-02-27 DIAGNOSIS — Z7952 Long term (current) use of systemic steroids: Secondary | ICD-10-CM | POA: Diagnosis not present

## 2016-02-27 DIAGNOSIS — J441 Chronic obstructive pulmonary disease with (acute) exacerbation: Secondary | ICD-10-CM | POA: Diagnosis not present

## 2016-03-01 DIAGNOSIS — R05 Cough: Secondary | ICD-10-CM | POA: Diagnosis not present

## 2016-03-01 DIAGNOSIS — J449 Chronic obstructive pulmonary disease, unspecified: Secondary | ICD-10-CM | POA: Diagnosis not present

## 2016-03-03 DIAGNOSIS — E782 Mixed hyperlipidemia: Secondary | ICD-10-CM | POA: Diagnosis not present

## 2016-03-03 DIAGNOSIS — Z7952 Long term (current) use of systemic steroids: Secondary | ICD-10-CM | POA: Diagnosis not present

## 2016-03-03 DIAGNOSIS — I5032 Chronic diastolic (congestive) heart failure: Secondary | ICD-10-CM | POA: Diagnosis not present

## 2016-03-03 DIAGNOSIS — Z4800 Encounter for change or removal of nonsurgical wound dressing: Secondary | ICD-10-CM | POA: Diagnosis not present

## 2016-03-03 DIAGNOSIS — Z9181 History of falling: Secondary | ICD-10-CM | POA: Diagnosis not present

## 2016-03-03 DIAGNOSIS — Z794 Long term (current) use of insulin: Secondary | ICD-10-CM | POA: Diagnosis not present

## 2016-03-03 DIAGNOSIS — I110 Hypertensive heart disease with heart failure: Secondary | ICD-10-CM | POA: Diagnosis not present

## 2016-03-03 DIAGNOSIS — J441 Chronic obstructive pulmonary disease with (acute) exacerbation: Secondary | ICD-10-CM | POA: Diagnosis not present

## 2016-03-03 DIAGNOSIS — R238 Other skin changes: Secondary | ICD-10-CM | POA: Diagnosis not present

## 2016-03-03 DIAGNOSIS — Z7902 Long term (current) use of antithrombotics/antiplatelets: Secondary | ICD-10-CM | POA: Diagnosis not present

## 2016-03-03 DIAGNOSIS — E119 Type 2 diabetes mellitus without complications: Secondary | ICD-10-CM | POA: Diagnosis not present

## 2016-03-05 DIAGNOSIS — J449 Chronic obstructive pulmonary disease, unspecified: Secondary | ICD-10-CM | POA: Diagnosis not present

## 2016-03-09 ENCOUNTER — Other Ambulatory Visit: Payer: Self-pay | Admitting: Family Medicine

## 2016-03-10 ENCOUNTER — Telehealth: Payer: Self-pay | Admitting: Family Medicine

## 2016-03-10 DIAGNOSIS — Z9181 History of falling: Secondary | ICD-10-CM | POA: Diagnosis not present

## 2016-03-10 DIAGNOSIS — Z7952 Long term (current) use of systemic steroids: Secondary | ICD-10-CM | POA: Diagnosis not present

## 2016-03-10 DIAGNOSIS — E782 Mixed hyperlipidemia: Secondary | ICD-10-CM | POA: Diagnosis not present

## 2016-03-10 DIAGNOSIS — Z4800 Encounter for change or removal of nonsurgical wound dressing: Secondary | ICD-10-CM | POA: Diagnosis not present

## 2016-03-10 DIAGNOSIS — R238 Other skin changes: Secondary | ICD-10-CM | POA: Diagnosis not present

## 2016-03-10 DIAGNOSIS — Z7902 Long term (current) use of antithrombotics/antiplatelets: Secondary | ICD-10-CM | POA: Diagnosis not present

## 2016-03-10 DIAGNOSIS — Z794 Long term (current) use of insulin: Secondary | ICD-10-CM | POA: Diagnosis not present

## 2016-03-10 DIAGNOSIS — I110 Hypertensive heart disease with heart failure: Secondary | ICD-10-CM | POA: Diagnosis not present

## 2016-03-10 DIAGNOSIS — I5032 Chronic diastolic (congestive) heart failure: Secondary | ICD-10-CM | POA: Diagnosis not present

## 2016-03-10 DIAGNOSIS — J441 Chronic obstructive pulmonary disease with (acute) exacerbation: Secondary | ICD-10-CM | POA: Diagnosis not present

## 2016-03-10 DIAGNOSIS — E119 Type 2 diabetes mellitus without complications: Secondary | ICD-10-CM | POA: Diagnosis not present

## 2016-03-10 NOTE — Telephone Encounter (Signed)
Marcelino [facility]Michelle [facility] requesting a verbal to recertify home health aid and nurse aid along with social worker. Please give her a call at [phone]

## 2016-03-11 NOTE — Telephone Encounter (Signed)
Called Ardine EngMichelle [name] back and left a vm

## 2016-03-12 DIAGNOSIS — R238 Other skin changes: Secondary | ICD-10-CM | POA: Diagnosis not present

## 2016-03-12 DIAGNOSIS — I110 Hypertensive heart disease with heart failure: Secondary | ICD-10-CM | POA: Diagnosis not present

## 2016-03-12 DIAGNOSIS — Z794 Long term (current) use of insulin: Secondary | ICD-10-CM | POA: Diagnosis not present

## 2016-03-12 DIAGNOSIS — I5032 Chronic diastolic (congestive) heart failure: Secondary | ICD-10-CM | POA: Diagnosis not present

## 2016-03-12 DIAGNOSIS — Z7952 Long term (current) use of systemic steroids: Secondary | ICD-10-CM | POA: Diagnosis not present

## 2016-03-12 DIAGNOSIS — E782 Mixed hyperlipidemia: Secondary | ICD-10-CM | POA: Diagnosis not present

## 2016-03-12 DIAGNOSIS — Z4800 Encounter for change or removal of nonsurgical wound dressing: Secondary | ICD-10-CM | POA: Diagnosis not present

## 2016-03-12 DIAGNOSIS — Z7902 Long term (current) use of antithrombotics/antiplatelets: Secondary | ICD-10-CM | POA: Diagnosis not present

## 2016-03-12 DIAGNOSIS — Z9181 History of falling: Secondary | ICD-10-CM | POA: Diagnosis not present

## 2016-03-12 DIAGNOSIS — J441 Chronic obstructive pulmonary disease with (acute) exacerbation: Secondary | ICD-10-CM | POA: Diagnosis not present

## 2016-03-12 DIAGNOSIS — E119 Type 2 diabetes mellitus without complications: Secondary | ICD-10-CM | POA: Diagnosis not present

## 2016-03-22 DIAGNOSIS — Z9181 History of falling: Secondary | ICD-10-CM | POA: Diagnosis not present

## 2016-03-22 DIAGNOSIS — I110 Hypertensive heart disease with heart failure: Secondary | ICD-10-CM | POA: Diagnosis not present

## 2016-03-22 DIAGNOSIS — R238 Other skin changes: Secondary | ICD-10-CM | POA: Diagnosis not present

## 2016-03-22 DIAGNOSIS — J441 Chronic obstructive pulmonary disease with (acute) exacerbation: Secondary | ICD-10-CM | POA: Diagnosis not present

## 2016-03-22 DIAGNOSIS — Z4800 Encounter for change or removal of nonsurgical wound dressing: Secondary | ICD-10-CM | POA: Diagnosis not present

## 2016-03-22 DIAGNOSIS — Z7952 Long term (current) use of systemic steroids: Secondary | ICD-10-CM | POA: Diagnosis not present

## 2016-03-22 DIAGNOSIS — I5032 Chronic diastolic (congestive) heart failure: Secondary | ICD-10-CM | POA: Diagnosis not present

## 2016-03-22 DIAGNOSIS — E119 Type 2 diabetes mellitus without complications: Secondary | ICD-10-CM | POA: Diagnosis not present

## 2016-03-22 DIAGNOSIS — Z7902 Long term (current) use of antithrombotics/antiplatelets: Secondary | ICD-10-CM | POA: Diagnosis not present

## 2016-03-22 DIAGNOSIS — Z794 Long term (current) use of insulin: Secondary | ICD-10-CM | POA: Diagnosis not present

## 2016-03-22 DIAGNOSIS — E782 Mixed hyperlipidemia: Secondary | ICD-10-CM | POA: Diagnosis not present

## 2016-03-23 ENCOUNTER — Other Ambulatory Visit: Payer: Self-pay | Admitting: Family Medicine

## 2016-03-25 DIAGNOSIS — Z9181 History of falling: Secondary | ICD-10-CM | POA: Diagnosis not present

## 2016-03-25 DIAGNOSIS — I5032 Chronic diastolic (congestive) heart failure: Secondary | ICD-10-CM | POA: Diagnosis not present

## 2016-03-25 DIAGNOSIS — E119 Type 2 diabetes mellitus without complications: Secondary | ICD-10-CM | POA: Diagnosis not present

## 2016-03-25 DIAGNOSIS — Z7952 Long term (current) use of systemic steroids: Secondary | ICD-10-CM | POA: Diagnosis not present

## 2016-03-25 DIAGNOSIS — I110 Hypertensive heart disease with heart failure: Secondary | ICD-10-CM | POA: Diagnosis not present

## 2016-03-25 DIAGNOSIS — R238 Other skin changes: Secondary | ICD-10-CM | POA: Diagnosis not present

## 2016-03-25 DIAGNOSIS — E782 Mixed hyperlipidemia: Secondary | ICD-10-CM | POA: Diagnosis not present

## 2016-03-25 DIAGNOSIS — Z4800 Encounter for change or removal of nonsurgical wound dressing: Secondary | ICD-10-CM | POA: Diagnosis not present

## 2016-03-25 DIAGNOSIS — Z7902 Long term (current) use of antithrombotics/antiplatelets: Secondary | ICD-10-CM | POA: Diagnosis not present

## 2016-03-25 DIAGNOSIS — Z794 Long term (current) use of insulin: Secondary | ICD-10-CM | POA: Diagnosis not present

## 2016-03-25 DIAGNOSIS — J441 Chronic obstructive pulmonary disease with (acute) exacerbation: Secondary | ICD-10-CM | POA: Diagnosis not present

## 2016-03-29 DIAGNOSIS — J449 Chronic obstructive pulmonary disease, unspecified: Secondary | ICD-10-CM | POA: Diagnosis not present

## 2016-03-31 DIAGNOSIS — Z7902 Long term (current) use of antithrombotics/antiplatelets: Secondary | ICD-10-CM | POA: Diagnosis not present

## 2016-03-31 DIAGNOSIS — E119 Type 2 diabetes mellitus without complications: Secondary | ICD-10-CM | POA: Diagnosis not present

## 2016-03-31 DIAGNOSIS — J441 Chronic obstructive pulmonary disease with (acute) exacerbation: Secondary | ICD-10-CM | POA: Diagnosis not present

## 2016-03-31 DIAGNOSIS — I110 Hypertensive heart disease with heart failure: Secondary | ICD-10-CM | POA: Diagnosis not present

## 2016-03-31 DIAGNOSIS — Z4800 Encounter for change or removal of nonsurgical wound dressing: Secondary | ICD-10-CM | POA: Diagnosis not present

## 2016-03-31 DIAGNOSIS — R238 Other skin changes: Secondary | ICD-10-CM | POA: Diagnosis not present

## 2016-03-31 DIAGNOSIS — E782 Mixed hyperlipidemia: Secondary | ICD-10-CM | POA: Diagnosis not present

## 2016-03-31 DIAGNOSIS — Z794 Long term (current) use of insulin: Secondary | ICD-10-CM | POA: Diagnosis not present

## 2016-03-31 DIAGNOSIS — I5032 Chronic diastolic (congestive) heart failure: Secondary | ICD-10-CM | POA: Diagnosis not present

## 2016-03-31 DIAGNOSIS — Z9181 History of falling: Secondary | ICD-10-CM | POA: Diagnosis not present

## 2016-03-31 DIAGNOSIS — Z7952 Long term (current) use of systemic steroids: Secondary | ICD-10-CM | POA: Diagnosis not present

## 2016-04-04 DIAGNOSIS — J449 Chronic obstructive pulmonary disease, unspecified: Secondary | ICD-10-CM | POA: Diagnosis not present

## 2016-04-05 DIAGNOSIS — Z7952 Long term (current) use of systemic steroids: Secondary | ICD-10-CM | POA: Diagnosis not present

## 2016-04-05 DIAGNOSIS — Z4800 Encounter for change or removal of nonsurgical wound dressing: Secondary | ICD-10-CM | POA: Diagnosis not present

## 2016-04-05 DIAGNOSIS — Z9181 History of falling: Secondary | ICD-10-CM | POA: Diagnosis not present

## 2016-04-05 DIAGNOSIS — E119 Type 2 diabetes mellitus without complications: Secondary | ICD-10-CM | POA: Diagnosis not present

## 2016-04-05 DIAGNOSIS — J441 Chronic obstructive pulmonary disease with (acute) exacerbation: Secondary | ICD-10-CM | POA: Diagnosis not present

## 2016-04-05 DIAGNOSIS — Z794 Long term (current) use of insulin: Secondary | ICD-10-CM | POA: Diagnosis not present

## 2016-04-05 DIAGNOSIS — I5032 Chronic diastolic (congestive) heart failure: Secondary | ICD-10-CM | POA: Diagnosis not present

## 2016-04-05 DIAGNOSIS — E782 Mixed hyperlipidemia: Secondary | ICD-10-CM | POA: Diagnosis not present

## 2016-04-05 DIAGNOSIS — Z7902 Long term (current) use of antithrombotics/antiplatelets: Secondary | ICD-10-CM | POA: Diagnosis not present

## 2016-04-05 DIAGNOSIS — I110 Hypertensive heart disease with heart failure: Secondary | ICD-10-CM | POA: Diagnosis not present

## 2016-04-05 DIAGNOSIS — R238 Other skin changes: Secondary | ICD-10-CM | POA: Diagnosis not present

## 2016-04-06 ENCOUNTER — Other Ambulatory Visit: Payer: Self-pay | Admitting: Family Medicine

## 2016-04-11 ENCOUNTER — Other Ambulatory Visit: Payer: Self-pay | Admitting: Family Medicine

## 2016-04-13 ENCOUNTER — Encounter: Payer: Self-pay | Admitting: Family Medicine

## 2016-04-13 ENCOUNTER — Ambulatory Visit (INDEPENDENT_AMBULATORY_CARE_PROVIDER_SITE_OTHER): Payer: Medicare Other | Admitting: Family Medicine

## 2016-04-13 VITALS — BP 116/68 | HR 99 | Temp 97.7°F | Resp 18 | Ht <= 58 in | Wt 184.0 lb

## 2016-04-13 DIAGNOSIS — Z741 Need for assistance with personal care: Secondary | ICD-10-CM | POA: Insufficient documentation

## 2016-04-13 NOTE — Progress Notes (Signed)
Name: Karen [name]   MRN: [id number]    DOB: 1941/07/25   Date:04/13/2016       Progress Note  Subjective  Chief Complaint  Chief Complaint  Patient presents with  . home health forms    HPI  Pt. Is here to complete paperwork for obtaining Personal Care Services. She resides at her own home, has Home Health and Child psychotherapistocial Worker who comes in to check on her, review medications and make sure that she is taking them appropriately. She is now requesting assistance with Personal Care Services such as dressing, bathing, cleaning, cooking, and other usual household activities that require assistance to be performed properly. A review of her chart shows pt. has been diagnosed with COPD (dependent on Home Oxygen), Asthma, Obstructive Sleep Apnea. Atrial fibrillation, Hypertension, and Diabetes.  She is being managed by PCP along with specialists. She reports that most of her medical conditions have been diagnosed at least 10 years ago.   Past Medical History  Diagnosis Date  . Diabetes mellitus without complication (HCC)   . COPD (chronic obstructive pulmonary disease) (HCC)   . Hypertension   . Hyperlipidemia     Past Surgical History  Procedure Laterality Date  . No past surgeries      No family history on file.  Social History   Social History  . Marital Status: Single    Spouse Name: N/A  . Number of Children: N/A  . Years of Education: N/A   Occupational History  . Not on file.   Social History Main Topics  . Smoking status: Never Smoker   . Smokeless tobacco: Not on file  . Alcohol Use: No  . Drug Use: No  . Sexual Activity: No   Other Topics Concern  . Not on file   Social History Narrative     Current outpatient prescriptions:  .  amLODipine (NORVASC) 10 MG tablet, Take by mouth., Disp: , Rfl:  .  azithromycin (ZITHROMAX) 250 MG tablet, TAKE 2 TABLETS BY MOUTH TODAY, THEN 1 TABLET DAILY FOR 4 MORE DAYS., Disp: 6 each, Rfl: 0 .  B-D INS SYRINGE 0.5CC/31GX5/16  31G X 5/16" 0.5 ML MISC, USE TO INJECT INSULIN TWICE DAILY., Disp: 100 each, Rfl: 6 .  cephALEXin (KEFLEX) 500 MG capsule, TAKE 1 CAPSULE BY MOUTH 4 TIMES DAILY, Disp: 40 capsule, Rfl: 0 .  cephALEXin (KEFLEX) 500 MG capsule, TAKE 1 CAPSULE BY MOUTH 4 TIMES DAILY., Disp: 28 capsule, Rfl: 0 .  clopidogrel (PLAVIX) 75 MG tablet, TAKE 1 TABLET BY MOUTH ONCE DAILY., Disp: 30 tablet, Rfl: 11 .  clotrimazole (LOTRIMIN) 1 % cream, , Disp: , Rfl:  .  digoxin (LANOXIN) 0.125 MG tablet, Take by mouth daily. One every other day, Disp: , Rfl:  .  furosemide (LASIX) 40 MG tablet, Take by mouth., Disp: , Rfl:  .  HUMALOG KWIKPEN 100 UNIT/ML KiwkPen, INJECT 5 UNITS SUBCUTANEOUSLY AT LUNCH IF BLOOD SUGAR IS 150-200mg /dL, 10 UNITSIF 409-$[id number]_QMVHQIONGEXBMWUXLKGMWNUUVOZDGUYQ$$IHKVQQVZDGLOVFIE_PPIRJJOACZYSAYTKZSWFUXNATFTDDUKG$200-250mg /dL, 15 UNITS IF [id number] mg/dL, AND 20 UNITS IF O, Disp: 3 mL, Rfl: 6 .  HUMULIN 70/30 (70-30) 100 UNIT/ML injection, INJECT 50 UNITS SUBCUTANEOUSLY EVERY MORNING AND 27 UNITS EVERY EVENING., Disp: 20 mL, Rfl: 1 .  Insulin Pen Needle (UNIFINE PENTIPS) 31G X 6 MM MISC, USE TO INJECT INSULIN ONCE DAILY AS DIRECTED., Disp: 90 each, Rfl: 3 .  levothyroxine (SYNTHROID, LEVOTHROID) 150 MCG tablet, TAKE 1 TABLET BY MOUTH ONCE DAILY ON AN EMPTY STOMACH. WAIT 30 MINUTES BEFORE TAKING OTHER MEDS., Disp:  90 tablet, Rfl: 0 .  losartan (COZAAR) 100 MG tablet, TAKE 1 TABLET BY MOUTH ONCE DAILY., Disp: 90 tablet, Rfl: 0 .  pantoprazole (PROTONIX) 40 MG tablet, TAKE 1 TABLET BY MOUTH ONCE DAILY., Disp: 90 tablet, Rfl: 3 .  pravastatin (PRAVACHOL) 40 MG tablet, TAKE 1 TABLET BY MOUTH ONCE DAILY., Disp: 30 tablet, Rfl: 1 .  predniSONE (DELTASONE) 20 MG tablet, TAKE 1 TABLET BY MOUTH ONCE DAILY WITH BREAKFAST., Disp: 10 tablet, Rfl: 0 .  raloxifene (EVISTA) 60 MG tablet, TAKE 1 TABLET BY MOUTH ONCE DAILY., Disp: 30 tablet, Rfl: 11 .  VENTOLIN HFA 108 (90 BASE) MCG/ACT inhaler, , Disp: , Rfl:   Allergies  Allergen Reactions  . Ace Inhibitors Other (See Comments)     Review of Systems   Constitutional: Negative for fever and chills.  Respiratory: Positive for cough, sputum production and shortness of breath.   Cardiovascular: Positive for chest pain (occasional chest pains.).    Objective  Filed Vitals:   04/13/16 1434  BP: 116/68  Pulse: 99  Temp: 97.7 F (36.5 C)  Resp: 18  Height:  (1.422 m)  Weight: 184 lb (83.462 kg)  SpO2: 96%    Physical Exam  Constitutional: She is well-developed, well-nourished, and in no distress.  Cardiovascular: Normal rate and regular rhythm.   Murmur heard.  Systolic murmur is present with a grade of 3/6  3/6 blowing murmur, best heard at left sternal border.  Pulmonary/Chest: She has no decreased breath sounds. She has wheezes. She has no rhonchi.  Decreased air entry bilateral lung fields, soft expiratory wheezing in bilateral lung fields, no rhonchi.  Nursing note and vitals reviewed.     Assessment & Plan  1. Encounter related to need for assistance with personal care Completed and signed the form authorizing provision of care services for ADLs.   Devyne Hauger [facility] 04/13/2016 2:56 PM

## 2016-04-19 ENCOUNTER — Other Ambulatory Visit: Payer: Self-pay | Admitting: Family Medicine

## 2016-04-28 DIAGNOSIS — J449 Chronic obstructive pulmonary disease, unspecified: Secondary | ICD-10-CM | POA: Diagnosis not present

## 2016-04-28 IMAGING — CR PELVIS - 1-2 VIEW
1 series · 2 of 2 positions shown · non-contrast
Comparison: 10/08/2012

CLINICAL DATA: 73-year-old with recent fall. The patient complains
of right hip and buttock pain.

EXAM:
PELVIS - 1-2 VIEW

[Series 1: ap · 0.17mm/px · 2 of 2 slices shown]
[im 1/2]
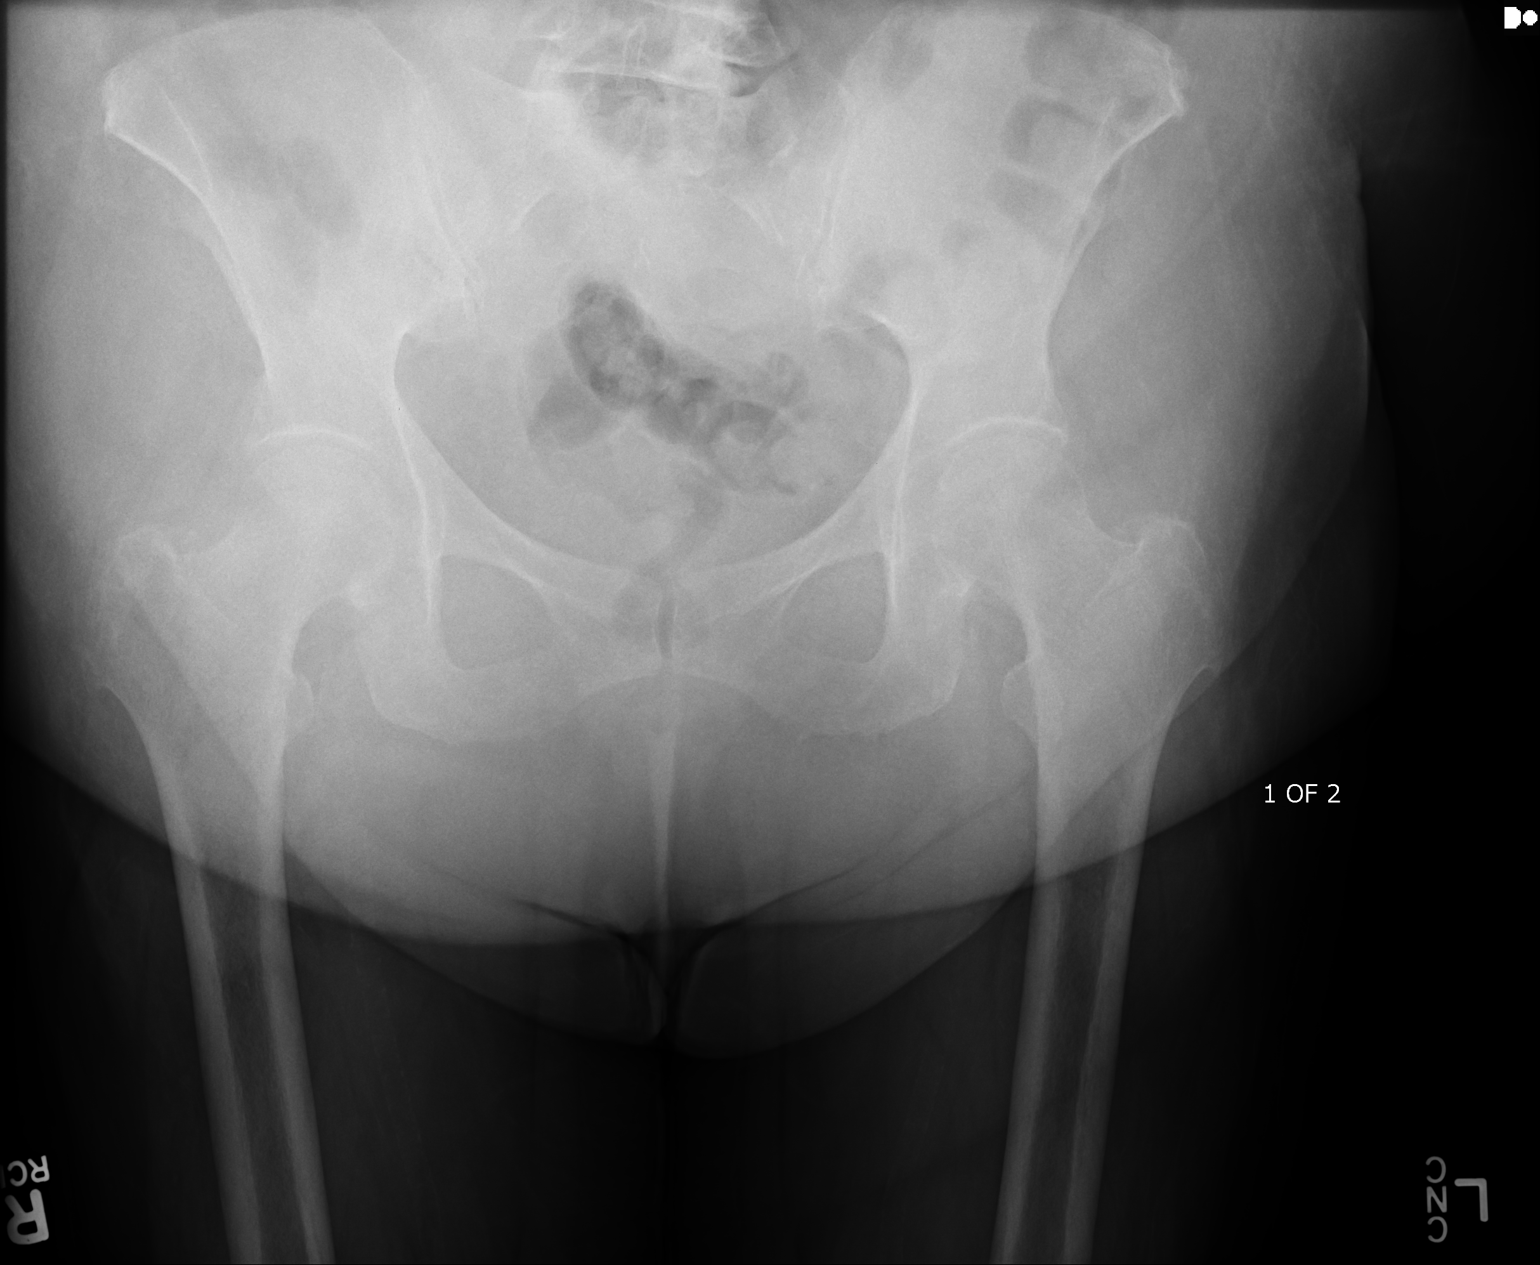
[im 2/2]
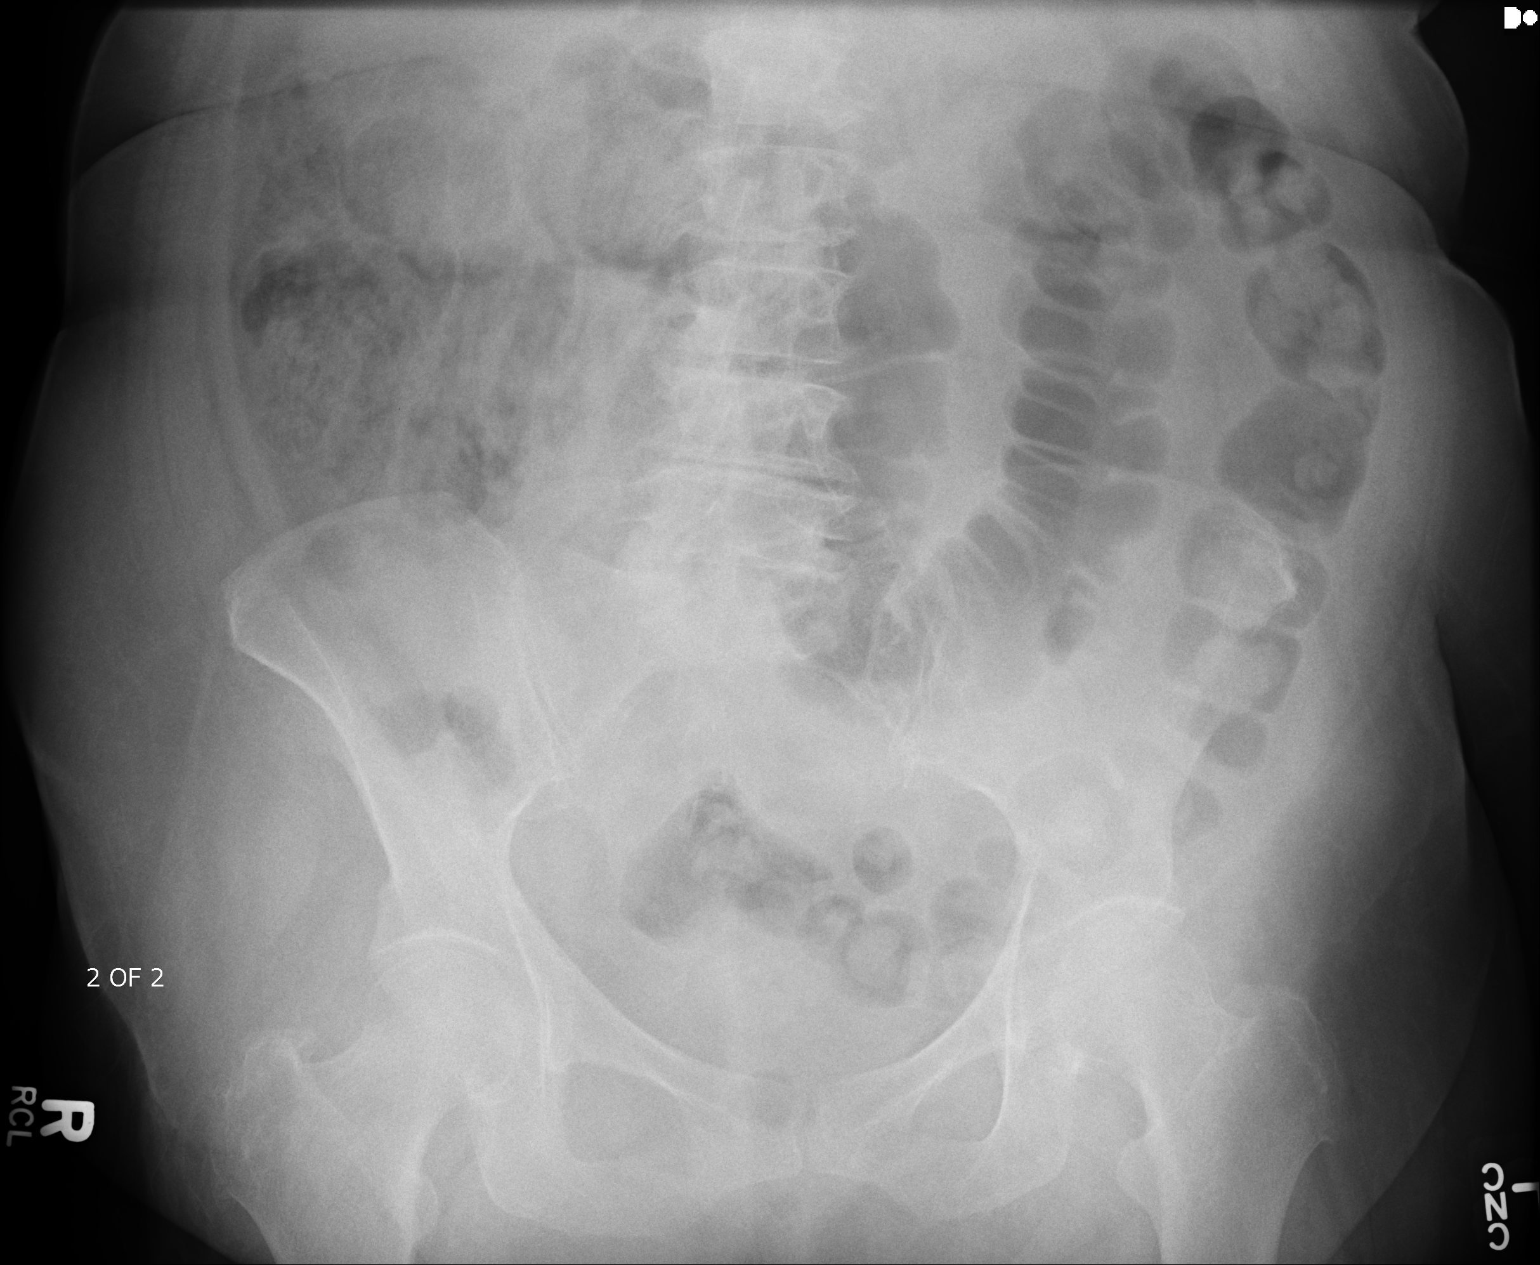

[2 of 2 positions shown; findings below may reference images not displayed]

FINDINGS: There is concern for a cortical step-off involving the right femur
lesser trochanter. This area is poorly visualized on this
examination. The pelvic bony ring is intact. Symmetric appearance of
the SI joints. The bone detail is limited by patient's body habitus.
Gas and stool throughout the colon. Large amount of stool in the
right colon.
IMPRESSION: Cannot exclude an injury to the right lesser trochanter. Recommend
dedicated right hip images.

## 2016-04-28 IMAGING — CR RIGHT HIP - COMPLETE 2+ VIEW
1 series · 3 of 3 positions shown · non-contrast
Comparison: 11/07/2014

CLINICAL DATA: Fall, right hip pain

EXAM:
RIGHT HIP - COMPLETE 2+ VIEW

[Series 1: t hip ap right · 0.14mm/px · 3 of 3 slices shown]
[im 1/3]
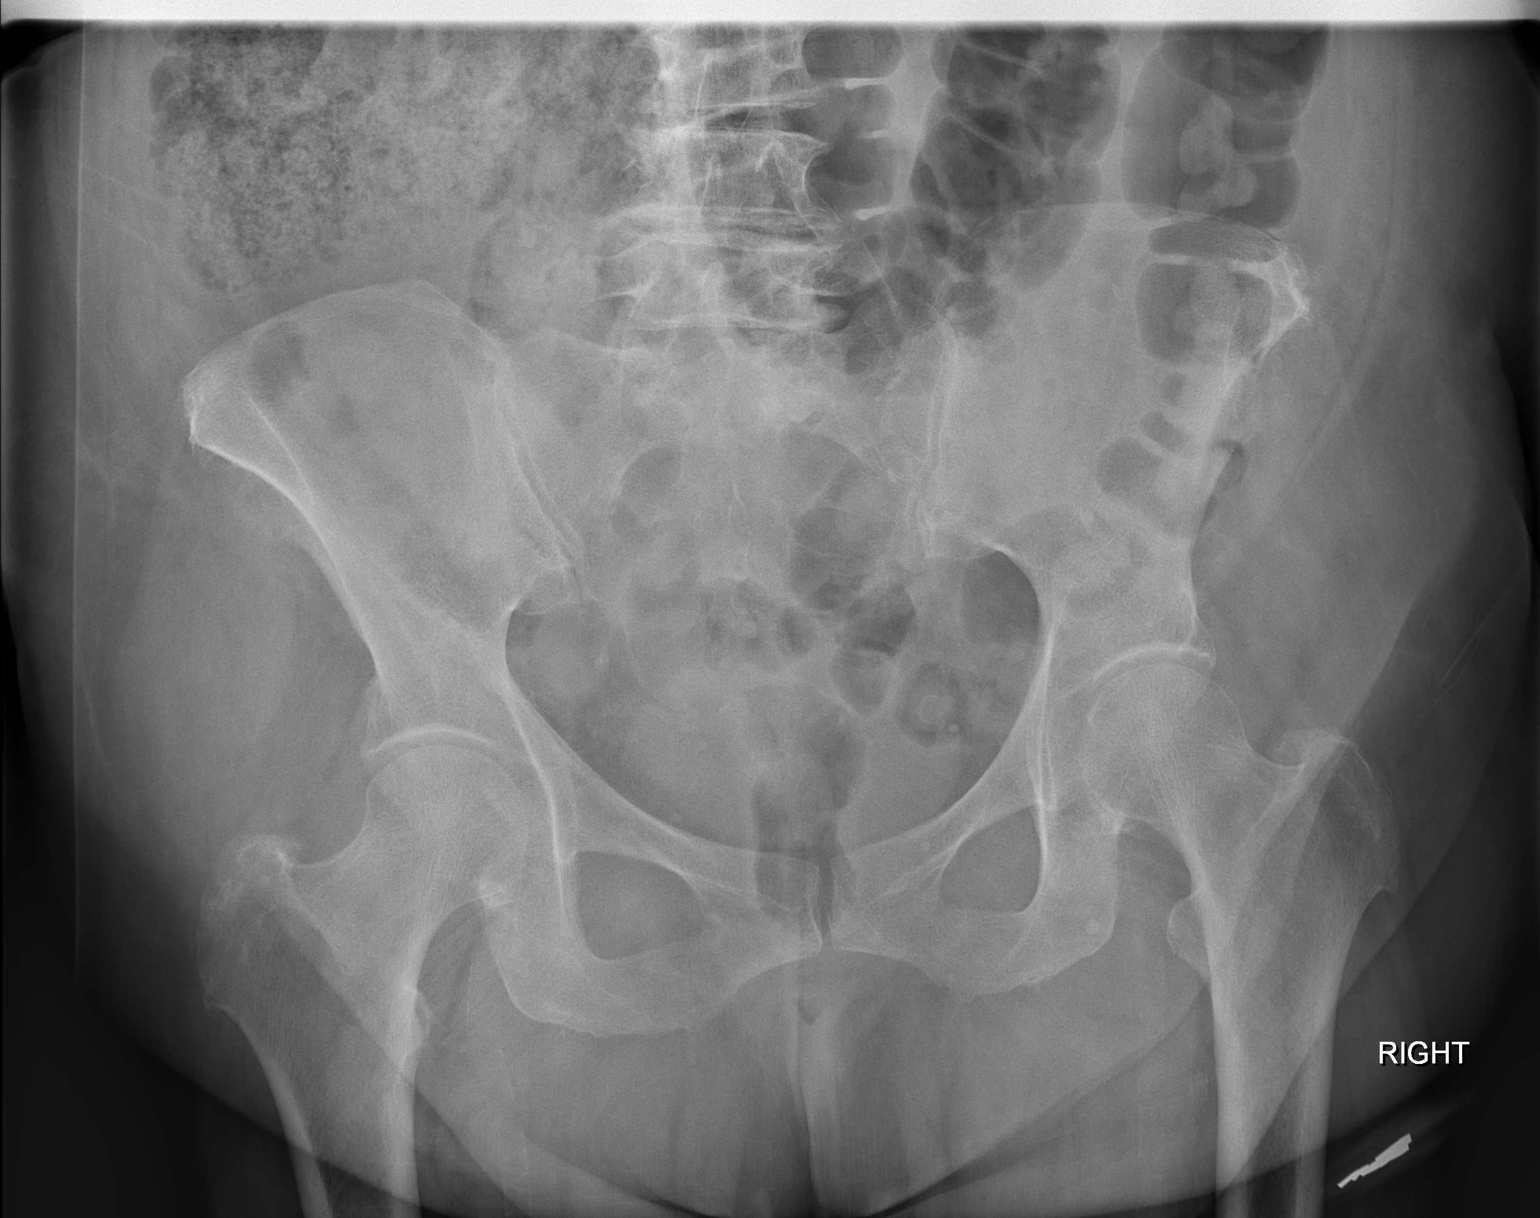
[im 2/3]
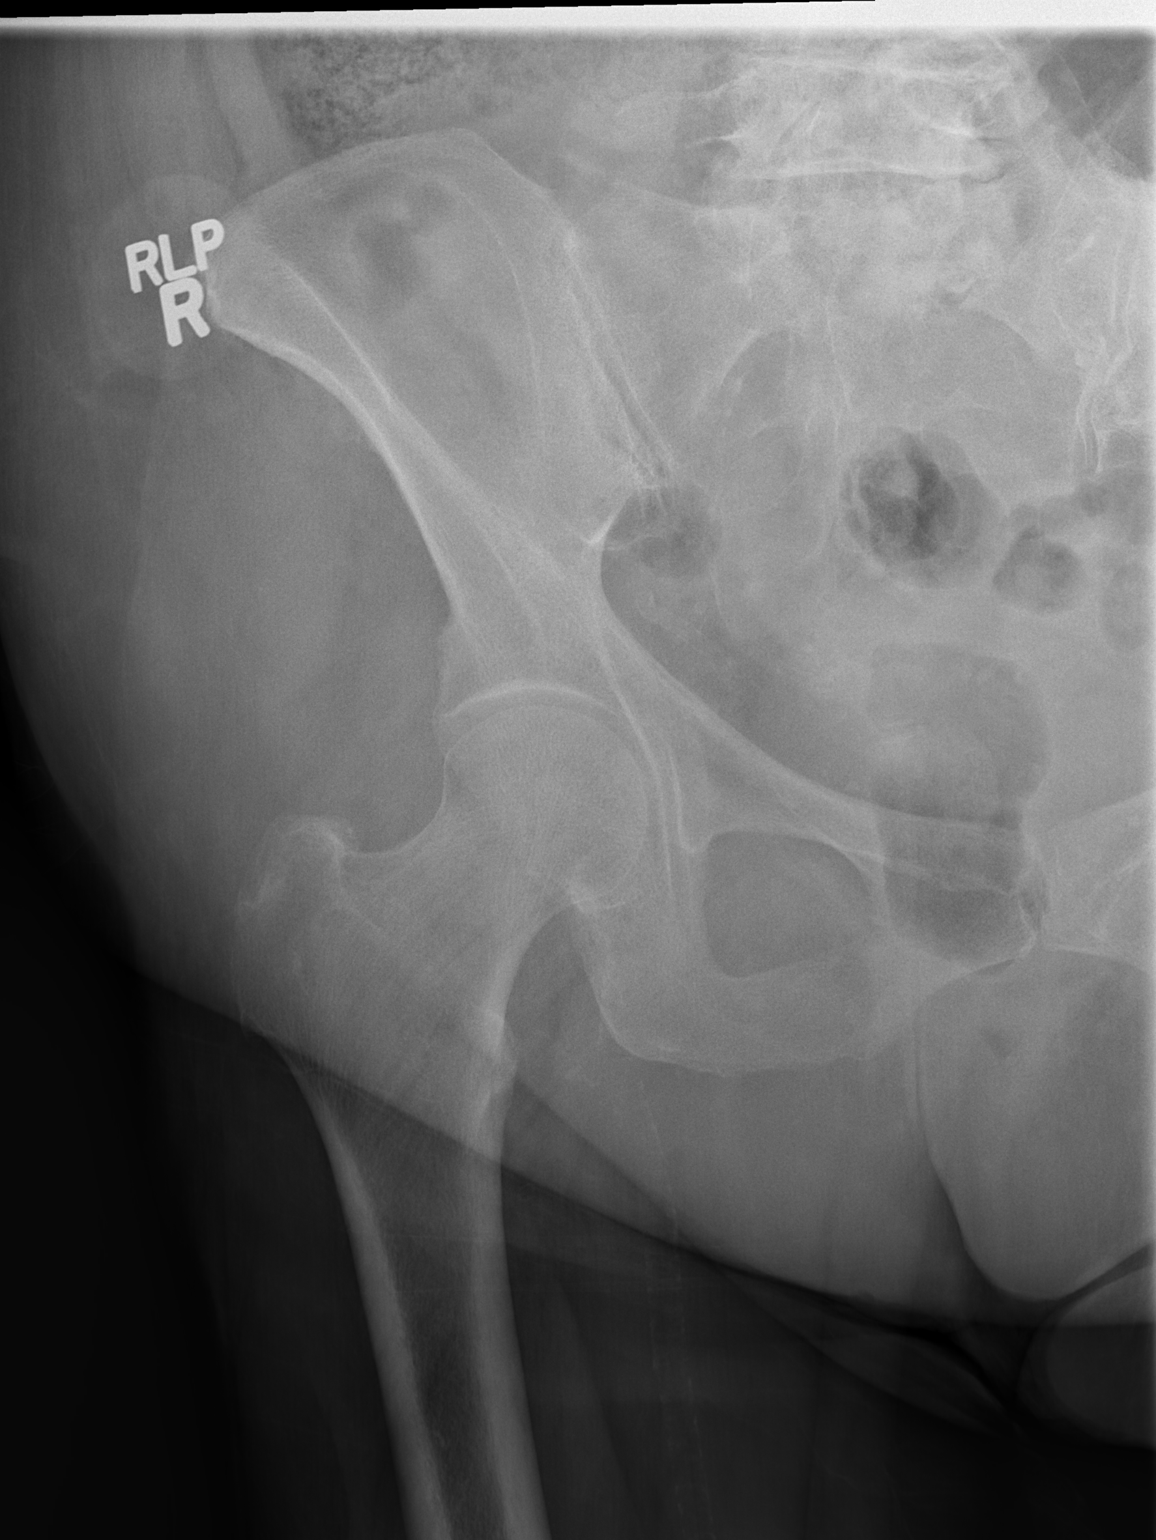
[im 3/3]
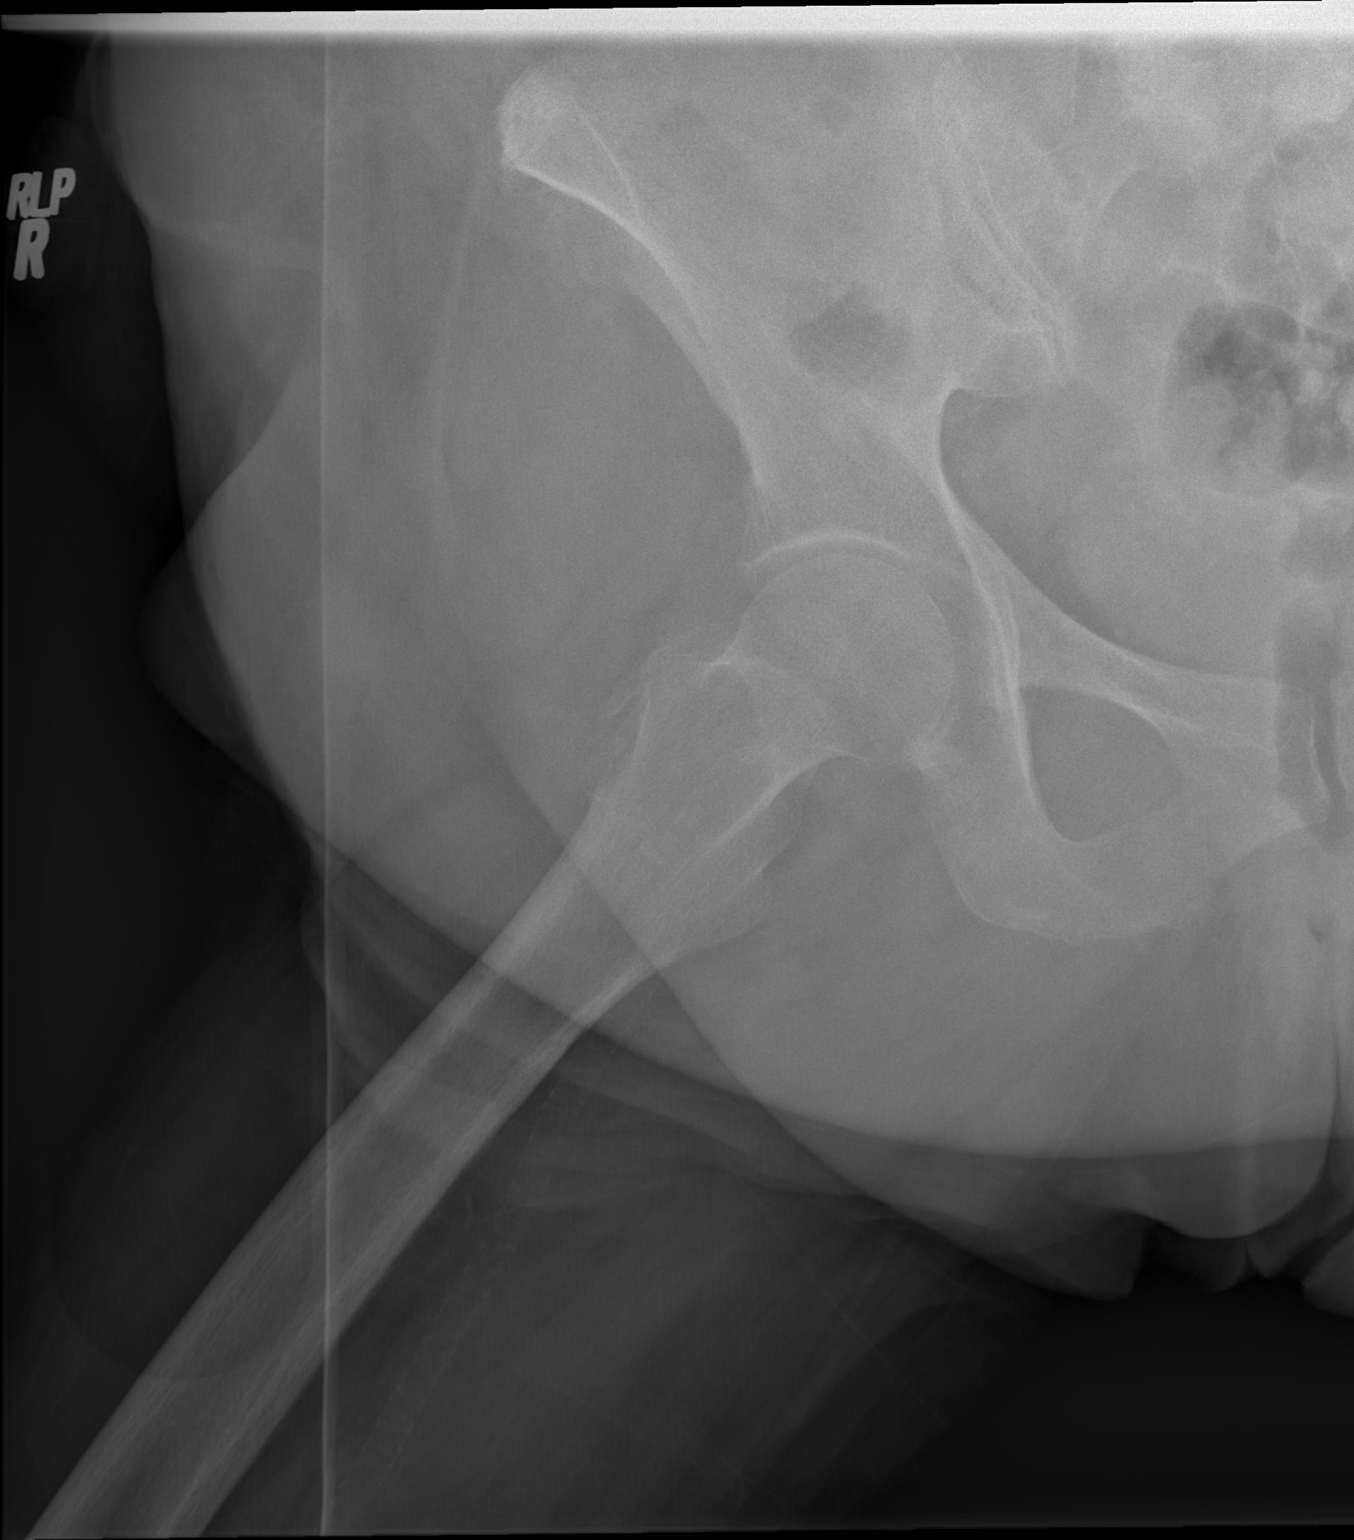

[3 of 3 positions shown; findings below may reference images not displayed]

FINDINGS: Degenerative changes of the lower lumbar spine. Bony pelvis appears
intact. No diastases. Hips appear symmetric. Right hip appears
intact. No definite fracture. Preserved joint spaces. No significant
arthropathy. Peripheral sclerosis noted.
IMPRESSION: No definite acute osseous finding by plain radiography.

## 2016-05-05 DIAGNOSIS — J449 Chronic obstructive pulmonary disease, unspecified: Secondary | ICD-10-CM | POA: Diagnosis not present

## 2016-05-09 ENCOUNTER — Other Ambulatory Visit: Payer: Self-pay | Admitting: Family Medicine

## 2016-05-29 DIAGNOSIS — J449 Chronic obstructive pulmonary disease, unspecified: Secondary | ICD-10-CM | POA: Diagnosis not present

## 2016-06-04 DIAGNOSIS — J449 Chronic obstructive pulmonary disease, unspecified: Secondary | ICD-10-CM | POA: Diagnosis not present

## 2016-06-06 ENCOUNTER — Other Ambulatory Visit: Payer: Self-pay | Admitting: Family Medicine

## 2016-06-09 ENCOUNTER — Other Ambulatory Visit: Payer: Self-pay | Admitting: Family Medicine

## 2016-06-10 DIAGNOSIS — I480 Paroxysmal atrial fibrillation: Secondary | ICD-10-CM | POA: Diagnosis not present

## 2016-06-10 DIAGNOSIS — I10 Essential (primary) hypertension: Secondary | ICD-10-CM | POA: Diagnosis not present

## 2016-06-10 DIAGNOSIS — R0602 Shortness of breath: Secondary | ICD-10-CM | POA: Diagnosis not present

## 2016-06-10 DIAGNOSIS — I509 Heart failure, unspecified: Secondary | ICD-10-CM | POA: Diagnosis not present

## 2016-06-10 DIAGNOSIS — G4733 Obstructive sleep apnea (adult) (pediatric): Secondary | ICD-10-CM | POA: Diagnosis not present

## 2016-06-27 ENCOUNTER — Ambulatory Visit: Payer: Medicare Other | Admitting: Podiatry

## 2016-06-28 DIAGNOSIS — J441 Chronic obstructive pulmonary disease with (acute) exacerbation: Secondary | ICD-10-CM | POA: Diagnosis not present

## 2016-06-29 ENCOUNTER — Encounter: Payer: Self-pay | Admitting: Podiatry

## 2016-06-29 ENCOUNTER — Ambulatory Visit (INDEPENDENT_AMBULATORY_CARE_PROVIDER_SITE_OTHER): Payer: Medicare Other | Admitting: Podiatry

## 2016-06-29 DIAGNOSIS — J309 Allergic rhinitis, unspecified: Secondary | ICD-10-CM | POA: Diagnosis not present

## 2016-06-29 DIAGNOSIS — M79676 Pain in unspecified toe(s): Secondary | ICD-10-CM

## 2016-06-29 DIAGNOSIS — J449 Chronic obstructive pulmonary disease, unspecified: Secondary | ICD-10-CM | POA: Diagnosis not present

## 2016-06-29 DIAGNOSIS — B351 Tinea unguium: Secondary | ICD-10-CM

## 2016-06-29 DIAGNOSIS — E1142 Type 2 diabetes mellitus with diabetic polyneuropathy: Secondary | ICD-10-CM

## 2016-06-29 NOTE — Progress Notes (Signed)
She presents today with a chief complaint of painful elongated toenails 1 through 5 bilateral.  Objective: Pulses remain palpable decreased sensorium persons once the monofilament. Toenails are thick yellow dystrophic onychomycotic. No open lesions or wounds.  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  Plan: Debridement of toenails 1 through 5 bilateral.

## 2016-07-05 DIAGNOSIS — J449 Chronic obstructive pulmonary disease, unspecified: Secondary | ICD-10-CM | POA: Diagnosis not present

## 2016-07-06 ENCOUNTER — Other Ambulatory Visit: Payer: Self-pay | Admitting: Family Medicine

## 2016-07-11 ENCOUNTER — Telehealth: Payer: Self-pay | Admitting: Family Medicine

## 2016-07-11 NOTE — Telephone Encounter (Signed)
errenous °

## 2016-07-12 MED ORDER — INSULIN PEN NEEDLE 31G X 6 MM MISC: 3 refills | Status: DC

## 2016-07-12 NOTE — Telephone Encounter (Signed)
Insulin pen needles sent to pharmacy.

## 2016-07-12 NOTE — Addendum Note (Signed)
Addended bySherryll Burger: Trude Cansler A A on: 07/12/2016 07:23 PM   Modules accepted: Orders

## 2016-07-12 NOTE — Telephone Encounter (Signed)
Patient is also needing the needles.

## 2016-07-13 NOTE — Telephone Encounter (Signed)
Patient informed. 

## 2016-07-20 ENCOUNTER — Ambulatory Visit (INDEPENDENT_AMBULATORY_CARE_PROVIDER_SITE_OTHER): Payer: Medicare Other | Admitting: Family Medicine

## 2016-07-20 ENCOUNTER — Encounter: Payer: Self-pay | Admitting: Family Medicine

## 2016-07-20 VITALS — BP 124/72 | HR 108 | Temp 98.0°F | Resp 16 | Ht <= 58 in | Wt 187.8 lb

## 2016-07-20 DIAGNOSIS — E785 Hyperlipidemia, unspecified: Secondary | ICD-10-CM | POA: Diagnosis not present

## 2016-07-20 DIAGNOSIS — E1165 Type 2 diabetes mellitus with hyperglycemia: Secondary | ICD-10-CM | POA: Diagnosis not present

## 2016-07-20 DIAGNOSIS — IMO0002 Reserved for concepts with insufficient information to code with codable children: Secondary | ICD-10-CM

## 2016-07-20 DIAGNOSIS — E1121 Type 2 diabetes mellitus with diabetic nephropathy: Secondary | ICD-10-CM | POA: Diagnosis not present

## 2016-07-20 DIAGNOSIS — I10 Essential (primary) hypertension: Secondary | ICD-10-CM | POA: Diagnosis not present

## 2016-07-20 LAB — GLUCOSE, POCT (MANUAL RESULT ENTRY): POC GLUCOSE: 280 mg/dL — AB (ref 70–99)

## 2016-07-20 LAB — POCT GLYCOSYLATED HEMOGLOBIN (HGB A1C): Hemoglobin A1C: 7.8

## 2016-07-20 NOTE — Progress Notes (Signed)
Name: Karen [name] M Sawtell   MRN: [id number]    DOB: Jan 14, 1941   Date:07/20/2016       Progress Note  Subjective  Chief Complaint  Chief Complaint  Patient presents with  . Diabetes    follow up medication refills  . Wheezing  . Hypertension  . Hyperlipidemia    Diabetes  She presents for her follow-up diabetic visit. She has type 2 diabetes mellitus. Her disease course has been improving. Pertinent negatives for hypoglycemia include no headaches. Pertinent negatives for diabetes include no blurred vision, no chest pain, no fatigue and no polydipsia. Diabetic complications include a CVA. Current diabetic treatment includes intensive insulin program. Her breakfast blood glucose range is generally 130-140 mg/dl. An ACE inhibitor/angiotensin II receptor blocker is being taken.  Hypertension  This is a chronic problem. The problem is controlled. Pertinent negatives include no blurred vision, chest pain, headaches, palpitations or shortness of breath. Past treatments include angiotensin blockers and calcium channel blockers. Hypertensive end-organ damage includes CVA. There is no history of kidney disease or CAD/MI.  Hyperlipidemia  This is a chronic problem. The problem is uncontrolled. Recent lipid tests were reviewed and are high. Pertinent negatives include no chest pain, leg pain, myalgias or shortness of breath. Current antihyperlipidemic treatment includes statins.    Past Medical History:  Diagnosis Date  . COPD (chronic obstructive pulmonary disease) (HCC)   . Diabetes mellitus without complication (HCC)   . Hyperlipidemia   . Hypertension     Past Surgical History:  Procedure Laterality Date  . NO PAST SURGERIES      No family history on file.  Social History   Social History  . Marital status: Single    Spouse name: N/A  . Number of children: N/A  . Years of education: N/A   Occupational History  . Not on file.   Social History Main Topics  . Smoking status: Never  Smoker  . Smokeless tobacco: Not on file  . Alcohol use No  . Drug use: No  . Sexual activity: No   Other Topics Concern  . Not on file   Social History Narrative  . No narrative on file     Current Outpatient Prescriptions:  .  amLODipine (NORVASC) 10 MG tablet, Take by mouth., Disp: , Rfl:  .  B-D INS SYRINGE 0.5CC/31GX5/16 31G X 5/16" 0.5 ML MISC, USE TO INJECT INSULIN TWICE DAILY., Disp: 100 each, Rfl: 6 .  clopidogrel (PLAVIX) 75 MG tablet, TAKE 1 TABLET BY MOUTH ONCE DAILY., Disp: 30 tablet, Rfl: 11 .  digoxin (LANOXIN) 0.125 MG tablet, Take by mouth daily. One every other day, Disp: , Rfl:  .  furosemide (LASIX) 40 MG tablet, Take by mouth., Disp: , Rfl:  .  HUMALOG KWIKPEN 100 UNIT/ML KiwkPen, INJECT 5 UNITS SUBCUTANEOUSLY AT LUNCH IF BLOOD SUGAR IS 150-200mg /dL, 10 UNITSIF [id number]$200-250mg /dL, 15 UNITS IF 254-270250-300 mg/dL, AND 20 UNITS IF O, Disp: 3 mL, Rfl: 6 .  HUMULIN 70/30 (70-30) 100 UNIT/ML injection, INJECT 50 UNITS SUBCUTANEOUSLY EVERY MORNING AND 27 UNITS EVERY EVENING., Disp: 20 mL, Rfl: 0 .  Insulin Pen Needle (UNIFINE PENTIPS) 31G X 6 MM MISC, Use as directed to inject Insulin., Disp: 90 each, Rfl: 3 .  levothyroxine (SYNTHROID, LEVOTHROID) 150 MCG tablet, TAKE 1 TABLET BY MOUTH ONCE DAILY ON AN EMPTY STOMACH. WAIT 30 MINUTES BEFORE TAKING OTHER MEDS., Disp: 90 tablet, Rfl: 0 .  losartan (COZAAR) 100 MG tablet, TAKE 1 TABLET BY MOUTH ONCE DAILY, Disp: 90  tablet, Rfl: 0 .  pantoprazole (PROTONIX) 40 MG tablet, TAKE 1 TABLET BY MOUTH ONCE DAILY., Disp: 90 tablet, Rfl: 3 .  pravastatin (PRAVACHOL) 40 MG tablet, TAKE 1 TABLET BY MOUTH ONCE DAILY., Disp: 30 tablet, Rfl: 1 .  predniSONE (DELTASONE) 20 MG tablet, TAKE 1 TABLET BY MOUTH ONCE DAILY WITH BREAKFAST., Disp: 10 tablet, Rfl: 0 .  raloxifene (EVISTA) 60 MG tablet, TAKE 1 TABLET BY MOUTH ONCE DAILY., Disp: 30 tablet, Rfl: 11 .  VENTOLIN HFA 108 (90 BASE) MCG/ACT inhaler, , Disp: , Rfl:  .  ferrous sulfate 325 (65 FE) MG EC  tablet, Take by mouth., Disp: , Rfl:  .  ipratropium-albuterol (DUONEB) 0.5-2.5 (3) MG/3ML SOLN, , Disp: , Rfl:  .  ipratropium-albuterol (DUONEB) 0.5-2.5 (3) MG/3ML SOLN, Inhale into the lungs., Disp: , Rfl:  .  PULMICORT FLEXHALER 180 MCG/ACT inhaler, , Disp: , Rfl:   Allergies  Allergen Reactions  . Ace Inhibitors Other (See Comments)     Review of Systems  Constitutional: Negative for fatigue.  Eyes: Negative for blurred vision.  Respiratory: Negative for shortness of breath.   Cardiovascular: Negative for chest pain and palpitations.  Musculoskeletal: Negative for myalgias.  Neurological: Negative for headaches.  Endo/Heme/Allergies: Negative for polydipsia.    Objective  Vitals:   07/20/16 1427  BP: 124/72  Pulse: (!) 108  Resp: 16  Temp: 98 F (36.7 C)  SpO2: 95%  Weight: 187 lb 12.8 oz (85.2 kg)  Height: 4\' 8"  (1.422 m)    Physical Exam  Constitutional: She is well-developed, well-nourished, and in no distress.  HENT:  Head: Normocephalic and atraumatic.  Cardiovascular: Regular rhythm, S1 normal and S2 normal.  Tachycardia present.   Murmur heard.  Systolic murmur is present with a grade of 2/6  Pulmonary/Chest: Effort normal. She has decreased breath sounds. She has wheezes. She has no rales.  Musculoskeletal:       Right ankle: She exhibits swelling.       Left ankle: She exhibits swelling.  Psychiatric: Mood, memory, affect and judgment normal.  Nursing note and vitals reviewed.      Recent Results (from the past 2160 hour(s))  POCT Glucose (CBG)     Status: Abnormal   Collection Time: 07/20/16  2:36 PM  Result Value Ref Range   POC Glucose 280 (A) 70 - 99 mg/dl  POCT HgB [id number]     Status: None   Collection Time: 07/20/16  2:43 PM  Result Value Ref Range   Hemoglobin A1C 7.8      Assessment & Plan  1. 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 No change in insulin regimen - POCT HgB A1C - POCT Glucose (CBG) - Urine Microalbumin w/creat. ratio  2. Hyperlipidemia Abnormal lipid profile, repeat and consider starting on a higher intensity statin - Lipid Profile - COMPLETE METABOLIC PANEL WITH GFR  3. Essential hypertension BP is at goal on present antihypertensive therapy   Suleyman Ehrman Asad A. Faylene KurtzShah [facility] 07/20/2016 2:45 PM

## 2016-07-21 ENCOUNTER — Other Ambulatory Visit: Payer: Self-pay | Admitting: Family Medicine

## 2016-07-29 DIAGNOSIS — J441 Chronic obstructive pulmonary disease with (acute) exacerbation: Secondary | ICD-10-CM | POA: Diagnosis not present

## 2016-08-01 ENCOUNTER — Telehealth: Payer: Self-pay | Admitting: Family Medicine

## 2016-08-01 MED ORDER — LOSARTAN POTASSIUM 100 MG PO TABS: 100.0000 mg | ORAL_TABLET | Freq: Every day | ORAL | 0 refills | Status: DC

## 2016-08-01 MED ORDER — INSULIN NPH ISOPHANE & REGULAR (70-30) 100 UNIT/ML ~~LOC~~ SUSP: SUBCUTANEOUS | 2 refills | Status: DC

## 2016-08-01 MED ORDER — CLOPIDOGREL BISULFATE 75 MG PO TABS: 75.0000 mg | ORAL_TABLET | Freq: Every day | ORAL | 3 refills | Status: DC

## 2016-08-01 MED ORDER — RALOXIFENE HCL 60 MG PO TABS: 60.0000 mg | ORAL_TABLET | Freq: Every day | ORAL | 11 refills | Status: DC

## 2016-08-01 MED ORDER — PRAVASTATIN SODIUM 40 MG PO TABS: 40.0000 mg | ORAL_TABLET | Freq: Every day | ORAL | 0 refills | Status: DC

## 2016-08-01 MED ORDER — INSULIN PEN NEEDLE 31G X 6 MM MISC: 3 refills | Status: DC

## 2016-08-01 MED ORDER — AMLODIPINE BESYLATE 10 MG PO TABS: 10.0000 mg | ORAL_TABLET | Freq: Every day | ORAL | 0 refills | Status: DC

## 2016-08-01 MED ORDER — IPRATROPIUM-ALBUTEROL 0.5-2.5 (3) MG/3ML IN SOLN: 3.0000 mL | Freq: Four times a day (QID) | RESPIRATORY_TRACT | 2 refills | Status: DC | PRN

## 2016-08-01 NOTE — Telephone Encounter (Signed)
Prescriptions for the above-mentioned medications have been sent to pharmacy

## 2016-08-05 DIAGNOSIS — J449 Chronic obstructive pulmonary disease, unspecified: Secondary | ICD-10-CM | POA: Diagnosis not present

## 2016-08-10 ENCOUNTER — Other Ambulatory Visit: Payer: Self-pay | Admitting: Family Medicine

## 2016-08-15 ENCOUNTER — Telehealth: Payer: Self-pay | Admitting: Family Medicine

## 2016-08-15 NOTE — Telephone Encounter (Signed)
Called [facility] and asked to speak with Lupita LeashDonna but [facility].  I informed Misty StanleyLisa that patient doesn't have [employer] or [employer] therefore we are unable to [facility] because the patient has UHC/Medicaid.  Misty StanleyLisa informed me that the call to our office must have been in error because patient has already been seen by Dr. Juliann Paresallwood and that they were aware that patient didn't need [facility].

## 2016-08-29 DIAGNOSIS — J441 Chronic obstructive pulmonary disease with (acute) exacerbation: Secondary | ICD-10-CM | POA: Diagnosis not present

## 2016-08-29 DIAGNOSIS — J440 Chronic obstructive pulmonary disease with acute lower respiratory infection: Secondary | ICD-10-CM | POA: Diagnosis not present

## 2016-08-29 DIAGNOSIS — J449 Chronic obstructive pulmonary disease, unspecified: Secondary | ICD-10-CM | POA: Diagnosis not present

## 2016-09-04 DIAGNOSIS — J449 Chronic obstructive pulmonary disease, unspecified: Secondary | ICD-10-CM | POA: Diagnosis not present

## 2016-09-08 ENCOUNTER — Telehealth: Payer: Self-pay | Admitting: Family Medicine

## 2016-09-08 NOTE — Telephone Encounter (Signed)
Last seen in august. She is requesting an order for her to get a bone density. Please call once complete.

## 2016-09-09 ENCOUNTER — Other Ambulatory Visit: Payer: Self-pay | Admitting: Family Medicine

## 2016-09-09 ENCOUNTER — Ambulatory Visit (INDEPENDENT_AMBULATORY_CARE_PROVIDER_SITE_OTHER): Payer: Medicare Other

## 2016-09-09 DIAGNOSIS — E1121 Type 2 diabetes mellitus with diabetic nephropathy: Secondary | ICD-10-CM | POA: Diagnosis not present

## 2016-09-09 DIAGNOSIS — E785 Hyperlipidemia, unspecified: Secondary | ICD-10-CM | POA: Diagnosis not present

## 2016-09-09 DIAGNOSIS — Z23 Encounter for immunization: Secondary | ICD-10-CM | POA: Diagnosis not present

## 2016-09-09 DIAGNOSIS — E1165 Type 2 diabetes mellitus with hyperglycemia: Secondary | ICD-10-CM | POA: Diagnosis not present

## 2016-09-09 LAB — LIPID PANEL
CHOLESTEROL: 176 mg/dL (ref 125–200)
HDL: 72 mg/dL (ref >=46)
LDL Cholesterol: 85 mg/dL (ref <130)
TRIGLYCERIDES: 94 mg/dL (ref <150)
Total CHOL/HDL Ratio: 2.4 Ratio (ref <=5.0)
VLDL: 19 mg/dL (ref <30)

## 2016-09-09 LAB — COMPLETE METABOLIC PANEL WITH GFR
ALT: 12 U/L (ref 6–29)
AST: 15 U/L (ref 10–35)
Albumin: 3.5 g/dL — ABNORMAL LOW (ref 3.6–5.1)
Alkaline Phosphatase: 41 U/L (ref 33–130)
BUN: 17 mg/dL (ref 7–25)
CHLORIDE: 104 mmol/L (ref 98–110)
CO2: 33 mmol/L — AB (ref 20–31)
Calcium: 8.6 mg/dL (ref 8.6–10.4)
Creat: 0.82 mg/dL (ref 0.60–0.93)
GFR, EST AFRICAN AMERICAN: 81 mL/min (ref >=60)
GFR, EST NON AFRICAN AMERICAN: 70 mL/min (ref >=60)
GLUCOSE: 147 mg/dL — AB (ref 65–99)
POTASSIUM: 3.9 mmol/L (ref 3.5–5.3)
SODIUM: 144 mmol/L (ref 135–146)
TOTAL PROTEIN: 5.9 g/dL — AB (ref 6.1–8.1)
Total Bilirubin: 0.5 mg/dL (ref 0.2–1.2)

## 2016-09-09 LAB — MICROALBUMIN / CREATININE URINE RATIO
Creatinine, Urine: 98 mg/dL (ref 20–320)
MICROALB UR: 2.5 mg/dL
MICROALB/CREAT RATIO: 26 ug/mg{creat} (ref <30)

## 2016-09-13 NOTE — Telephone Encounter (Signed)
Annual wellness appointment has been scheduled for 09/19/16

## 2016-09-19 ENCOUNTER — Ambulatory Visit: Payer: Medicare Other

## 2016-09-23 ENCOUNTER — Telehealth: Payer: Self-pay | Admitting: Family Medicine

## 2016-09-23 NOTE — Telephone Encounter (Signed)
Completely out of Levothyroxine 150 mcg. As[facility].

## 2016-09-26 ENCOUNTER — Ambulatory Visit: Payer: Medicare Other

## 2016-09-26 MED ORDER — LEVOTHYROXINE SODIUM 150 MCG PO TABS: ORAL_TABLET | ORAL | 0 refills | Status: DC

## 2016-09-26 NOTE — Telephone Encounter (Signed)
Prescription for levothyroxine is sent to pharmacy, patient will need appointment in one month to obtain TSH levels. Please schedule

## 2016-09-27 ENCOUNTER — Other Ambulatory Visit: Payer: Self-pay

## 2016-09-27 ENCOUNTER — Encounter (INDEPENDENT_AMBULATORY_CARE_PROVIDER_SITE_OTHER): Payer: Self-pay | Admitting: Vascular Surgery

## 2016-09-27 ENCOUNTER — Ambulatory Visit (INDEPENDENT_AMBULATORY_CARE_PROVIDER_SITE_OTHER): Payer: Medicare Other | Admitting: Vascular Surgery

## 2016-09-27 VITALS — BP 137/70 | HR 100 | Resp 18 | Ht 59.0 in | Wt 185.0 lb

## 2016-09-27 DIAGNOSIS — E785 Hyperlipidemia, unspecified: Secondary | ICD-10-CM | POA: Diagnosis not present

## 2016-09-27 DIAGNOSIS — Z794 Long term (current) use of insulin: Secondary | ICD-10-CM

## 2016-09-27 DIAGNOSIS — E118 Type 2 diabetes mellitus with unspecified complications: Secondary | ICD-10-CM | POA: Diagnosis not present

## 2016-09-27 DIAGNOSIS — L97221 Non-pressure chronic ulcer of left calf limited to breakdown of skin: Secondary | ICD-10-CM | POA: Insufficient documentation

## 2016-09-27 DIAGNOSIS — I10 Essential (primary) hypertension: Secondary | ICD-10-CM | POA: Diagnosis not present

## 2016-09-27 DIAGNOSIS — M7989 Other specified soft tissue disorders: Secondary | ICD-10-CM | POA: Diagnosis not present

## 2016-09-27 DIAGNOSIS — J418 Mixed simple and mucopurulent chronic bronchitis: Secondary | ICD-10-CM | POA: Diagnosis not present

## 2016-09-27 NOTE — Progress Notes (Addendum)
MRN : [id number]  Karen [name] Karen [name] [name] is a 75 y.o. (22-Jan-1941) female who presents with chief complaint of  Chief Complaint  Karen [name] [name] presents with  . Re-evaluation    Left leg redness and sore  .  History of Present Illness: Karen [name] [name] returns today in follow up of 40 recurrent and new ulceration on Karen [name] left anterior calf area. She reports no trauma or injury that started this. She is on 10 mg prednisone and has chronic leg swelling which is not improved. She has no fever or chills or signs of infection. She reports wearing her compression stockings intermittently. She has been having significant shortness of breath requiring nebulizer treatments every 6 hours and continuous oxygen. Karen [name] leg is heavy and painful. Karen [name] ulcer is about 5 cm lateral and lower than her previous ulceration earlier this year.  Current Outpatient Prescriptions  Medication Sig Dispense Refill  . amLODipine (Karen [name] Karen [name] [name]) 10 MG tablet Take 1 tablet (10 mg total) by mouth daily. 90 tablet 0  . B-D INS SYRINGE 0.5CC/31GX5/16 31G X 5/16" 0.5 ML MISC USE TO INJECT INSULIN TWICE DAILY. 100 each 6  . clopidogrel (Karen [name] Karen [name] [name]) 75 MG tablet Take 1 tablet (75 mg total) by mouth daily. 90 tablet 3  . digoxin (Karen [name] Karen [name] [name]) 0.125 MG tablet Take by mouth daily. One every other day    . ferrous sulfate 325 (65 FE) MG EC tablet Take by mouth.    . furosemide (Karen [name]) 40 MG tablet Take by mouth.    . insulin lispro (Karen [name] Karen [name] [name] KWIKPEN) 100 UNIT/ML KiwkPen Use as directed according to Sliding Scale. 15 mL 2  . insulin NPH-regular Human (Karen [name] Karen [name] [name] 70/30) (70-30) 100 UNIT/ML injection INJECT 50 UNITS IN Karen [name] Karen [name] Karen [name] [name] AND 27 UNITS EACH EVENING 20 mL 2  . Insulin Pen Needle (UNIFINE PENTIPS) 31G X 6 MM MISC Use as directed to inject Insulin. 90 each 3  . ipratropium-albuterol (DUONEB) 0.5-2.5 (3) MG/3ML SOLN Inhale into Karen [name] lungs.    Marland Kitchen ipratropium-albuterol (DUONEB) 0.5-2.5 (3) MG/3ML SOLN Take 3 mLs by nebulization every 6 (six) hours as needed. 360 mL 2  .  levothyroxine (SYNTHROID, LEVOTHROID) 150 MCG tablet TAKE 1 TABLET BY MOUTH ONCE DAILY ON AN EMPTY STOMACH. WAIT 30 MINUTES BEFORE TAKING OTHER MEDS. 90 tablet 0  . losartan (COZAAR) 100 MG tablet Take 1 tablet (100 mg total) by mouth daily. 90 tablet 0  . pantoprazole (PROTONIX) 40 MG tablet TAKE 1 TABLET BY MOUTH ONCE DAILY. 90 tablet 3  . pravastatin (PRAVACHOL) 40 MG tablet Take 1 tablet (40 mg total) by mouth daily. 90 tablet 0  . predniSONE (DELTASONE) 20 MG tablet TAKE 1 TABLET BY MOUTH ONCE DAILY WITH BREAKFAST. 10 tablet 0  . PULMICORT FLEXHALER 180 MCG/ACT inhaler     . raloxifene (Karen [name] Karen [name] [name]) 60 MG tablet Take 1 tablet (60 mg total) by mouth daily. 30 tablet 11  . VENTOLIN HFA 108 (90 BASE) MCG/ACT inhaler      No current facility-administered medications for this visit.     Past Medical History:  Diagnosis Date  . COPD (chronic obstructive pulmonary disease) (HCC)   . Diabetes mellitus without complication (HCC)   . Hyperlipidemia   . Hypertension     Past Surgical History:  Procedure Laterality Date  . CHOLECYSTECTOMY    . NO PAST SURGERIES      Social History Social History  Substance Use Topics  . Smoking status: Never Smoker  . Smokeless tobacco: Never Used  . Alcohol use No  Family History Family History  Problem Relation Age of Onset  . Heart disease Mother   . Heart disease Father    No bleeding disorders or clotting disorders  Allergies  Allergen Reactions  . Ace Inhibitors Other (See Comments)     REVIEW OF SYSTEMS (Negative unless checked)  Constitutional: [] Weight loss  [] Fever  [] Chills Cardiac: [] Chest pain   [] Chest pressure   [] Palpitations   [] Shortness of breath when laying flat   [] Shortness of breath at rest   [] Shortness of breath with exertion. Vascular:  [] Pain in legs with walking   [] Pain in legs at rest   [] Pain in legs when laying flat   [] Claudication   [] Pain in feet when walking  [] Pain in feet at rest  [] Pain in feet when  laying flat   [] History of DVT   [] Phlebitis   [x] Swelling in legs   [] Varicose veins   [x] Non-healing ulcers Pulmonary:   [x] Uses home oxygen   [x] Productive cough   [] Hemoptysis   [x] Wheeze  [x] COPD   [] Asthma Neurologic:  [] Dizziness  [] Blackouts   [] Seizures   [] History of stroke   [] History of TIA  [] Aphasia   [] Temporary blindness   [] Dysphagia   [] Weakness or numbness in arms   [] Weakness or numbness in legs Musculoskeletal:  [] Arthritis   [] Joint swelling   [] Joint pain   [] Low back pain Hematologic:  [] Easy bruising  [] Easy bleeding   [] Hypercoagulable state   [] Anemic   Gastrointestinal:  [] Blood in stool   [] Vomiting blood  [] Gastroesophageal reflux/heartburn   [] Abdominal pain Genitourinary:  [] Chronic kidney disease   [] Difficult urination  [] Frequent urination  [] Burning with urination   [] Hematuria Skin:  [] Rashes   [x] Ulcers   [x] Wounds Psychological:  [] History of anxiety   []  History of major depression.  Physical Examination  BP 137/70 (BP Location: Right Arm)   Pulse 100   Resp 18   Ht 4\' 11"  (1.499 m)   Wt 185 lb (83.9 kg)   BMI 37.37 kg/m  Gen:  WD/WN, NAD. Debilitated appearing Head: /AT, No temporalis wasting. Ear/Nose/Throat: Hearing grossly intact, nares w/o erythema or drainage, trachea midline Eyes: Conjunctiva clear. Sclera non-icteric Neck: Supple.  No JVD.  Pulmonary:  Good air movement, wheezes present bilaterally with respirations labored on supplemental oxygen Cardiac: RRR, normal S1, S2 Vascular:  Vessel Right Left  Radial Palpable Palpable  Ulnar Palpable Palpable  Brachial Palpable Palpable  Carotid Palpable, without bruit Palpable, without bruit  Aorta Not palpable N/A  Femoral Palpable Palpable  Popliteal Palpable Palpable  PT 1+ Palpable Not Palpable  DP Palpable Palpable   Gastrointestinal: soft, non-tender/non-distended. No guarding/reflex.  Musculoskeletal: M/S 5/5 throughout.  No deformity or atrophy. 1-2+ bilateral lower extremity  edema. Neurologic: Sensation grossly intact in extremities.  Symmetrical.  Speech is fluent.  Psychiatric: Judgment intact, Mood & affect appropriate for pt's clinical situation. Dermatologic: Small clean ulceration on Karen [name] left anterior lower calf area with no surrounding erythema and mild serous drainage. Lymph : No Cervical, Axillary, or Inguinal lymphadenopathy.      Labs Recent Results (from Karen [name] past 2160 hour(s))  POCT Glucose (CBG)     Status: Abnormal   Collection Time: 07/20/16  2:36 PM  Result Value Ref Range   POC Glucose 280 (A) 70 - 99 mg/dl  POCT HgB [id number]     Status: None   Collection Time: 07/20/16  2:43 PM  Result Value Ref Range   Hemoglobin A1C 7.8   COMPLETE METABOLIC PANEL  WITH GFR     Status: Abnormal   Collection Time: 09/09/16  8:38 AM  Result Value Ref Range   Sodium 144 135 - 146 mmol/L   Potassium 3.9 3.5 - 5.3 mmol/L   Chloride 104 98 - 110 mmol/L   CO2 33 (H) 20 - 31 mmol/L   Glucose, Bld 147 (H) 65 - 99 mg/dL   BUN 17 7 - 25 mg/dL   Creat 1.61 0.96 - 0.45 mg/dL    Comment:   For patients > or = 75 years of age: Karen [name] upper reference limit for Creatinine is approximately 13% higher for people identified as African-American.      Total Bilirubin 0.5 0.2 - 1.2 mg/dL   Alkaline Phosphatase 41 33 - 130 U/L   AST 15 10 - 35 U/L   ALT 12 6 - 29 U/L   Total Protein 5.9 (L) 6.1 - 8.1 g/dL   Albumin 3.5 (L) 3.6 - 5.1 g/dL   Calcium 8.6 8.6 - 40.9 mg/dL   GFR, Est African American 81 >=60 mL/min   GFR, Est Non African American 70 >=60 mL/min  Microalbumin / creatinine urine ratio     Status: None   Collection Time: 09/09/16  8:38 AM  Result Value Ref Range   Creatinine, Urine 98 20 - 320 mg/dL   Microalb, Ur 2.5 Not estab mg/dL   Microalb Creat Ratio 26 <30 mcg/mg creat    Comment: Karen [name] ADA has defined abnormalities in albumin excretion as follows:           Category           Result                            (mcg/mg creatinine)                  Normal:    <30       Microalbuminuria:    30 - 299   Clinical albuminuria:    > or = 300   Karen [name] ADA recommends that at least two of three specimens collected within a 3 - 6 month period be abnormal before considering a Karen [name] [name] to be within a diagnostic category.     Lipid panel     Status: None   Collection Time: 09/09/16  8:38 AM  Result Value Ref Range   Cholesterol 176 125 - 200 mg/dL   Triglycerides 94 <811 mg/dL   HDL 72 >=91 mg/dL   Total CHOL/HDL Ratio 2.4 <=5.0 Ratio   VLDL 19 <30 mg/dL   LDL Cholesterol 85 <478 mg/dL    Comment:   Total Cholesterol/HDL Ratio:CHD Risk                        Coronary Heart Disease Risk Table                                        Men       Women          1/2 Average Risk              3.4        3.3              Average Risk  5.0        4.4           2X Average Risk              9.6        7.1           3X Average Risk             23.4       11.0 Use Karen [name] calculated Karen [name] [name] Ratio above and Karen [name] CHD Risk table  to determine Karen [name] Karen [name] [name]'s CHD Risk.     Radiology No results found.   Assessment/Plan  Diabetes blood glucose control important in reducing Karen [name] progression of atherosclerotic disease. Also, involved in wound healing. On appropriate medications.   HLD (hyperlipidemia) lipid control important in reducing Karen [name] progression of atherosclerotic disease. Continue statin therapy   Chronic obstructive pulmonary disease (HCC) Severe. On continuous oxygen and still with shortness of breath and on 10 mg Prednisone which worsens her swelling and skin integrity.  BP (high blood pressure) blood pressure control important in reducing Karen [name] progression of atherosclerotic disease. On appropriate oral medications.   Swelling of limb Multifactorial. Immobility, chronic steroid therapy, and possibly some lymphedema from chronic scarring and lymphatic channels all involved. Previous venous duplex was negative.  Lower limb ulcer,  calf, left, limited to breakdown of skin (HCC) This is a recurrent and worrisome problem. Given her high dose of steroids, and her long-standing suboptimally controlled diabetes, Karen [name] Karen [name] [name] is at high risk for wound infection and breakdown. I would treat this aggressively with an Unna boot starting today and planning to continue this for several weeks. I have discussed Karen [name] importance of leg elevation and continued compression once Karen [name] Unna boot stop. This does not appear actively infected so I will not give her any antibiotics today.   6. Lower limb ulcer, calf, left, limited to breakdown of skin (HCC) - Apply unna boot - Ambulatory referral to Home Health   PLAN/PROCEDURE: A 3 layer wrap was placed on Karen [name] left lower extremity.  Follow-up in 3-4 week.  Festus Barren, MD  09/27/2016 10:55 AM    This note was created with Dragon medical transcription system.  Any errors from dictation are purely unintentional

## 2016-09-27 NOTE — Telephone Encounter (Signed)
Sam from [facility] called and states patient is completely without medication-Synthroid please refill. [phone]

## 2016-09-27 NOTE — Assessment & Plan Note (Signed)
lipid control important in reducing the progression of atherosclerotic disease. Continue statin therapy  

## 2016-09-27 NOTE — Assessment & Plan Note (Signed)
This is a recurrent and worrisome problem. Given her high dose of steroids, and her long-standing suboptimally controlled diabetes, the patient is at high risk for wound infection and breakdown. I would treat this aggressively with an Unna boot starting today and planning to continue this for several weeks. I have discussed the importance of leg elevation and continued compression once the Unna boot stop. This does not appear actively infected so I will not give her any antibiotics today.

## 2016-09-27 NOTE — Telephone Encounter (Signed)
Prescription has been sent to pharmacy.

## 2016-09-27 NOTE — Telephone Encounter (Signed)
Left voice message informing patient that prescription has been sent to pharmacy and that she need to schedule appointment next month.

## 2016-09-27 NOTE — Assessment & Plan Note (Signed)
blood glucose control important in reducing the progression of atherosclerotic disease. Also, involved in wound healing. On appropriate medications.  

## 2016-09-27 NOTE — Assessment & Plan Note (Signed)
blood pressure control important in reducing the progression of atherosclerotic disease. On appropriate oral medications.  

## 2016-09-27 NOTE — Assessment & Plan Note (Signed)
Multifactorial. Immobility, chronic steroid therapy, and possibly some lymphedema from chronic scarring and lymphatic channels all involved. Previous venous duplex was negative.

## 2016-09-27 NOTE — Assessment & Plan Note (Signed)
Severe. On continuous oxygen and still with shortness of breath and on 10 mg Prednisone which worsens her swelling and skin integrity.

## 2016-09-28 DIAGNOSIS — J441 Chronic obstructive pulmonary disease with (acute) exacerbation: Secondary | ICD-10-CM | POA: Diagnosis not present

## 2016-09-30 ENCOUNTER — Telehealth (INDEPENDENT_AMBULATORY_CARE_PROVIDER_SITE_OTHER): Payer: Self-pay | Admitting: Vascular Surgery

## 2016-09-30 NOTE — Telephone Encounter (Signed)
Pt was in office 10.24.17 and was told by Dr Wyn Quakerew a nurse was coming out to wrap her leg. Pt want to know when are they coming and who will be coming please call pt [phone]

## 2016-10-03 NOTE — Telephone Encounter (Signed)
Contacted the patient to find out what was needed, then [facility] to come out and change her unna boots weekly. I called back and spoke with her daughter in law and let her know someone will be out today or tomorrow to change her unna boots.

## 2016-10-04 ENCOUNTER — Telehealth (INDEPENDENT_AMBULATORY_CARE_PROVIDER_SITE_OTHER): Payer: Self-pay

## 2016-10-04 DIAGNOSIS — E119 Type 2 diabetes mellitus without complications: Secondary | ICD-10-CM | POA: Diagnosis not present

## 2016-10-04 DIAGNOSIS — Z7902 Long term (current) use of antithrombotics/antiplatelets: Secondary | ICD-10-CM | POA: Diagnosis not present

## 2016-10-04 DIAGNOSIS — Z7952 Long term (current) use of systemic steroids: Secondary | ICD-10-CM | POA: Diagnosis not present

## 2016-10-04 DIAGNOSIS — I110 Hypertensive heart disease with heart failure: Secondary | ICD-10-CM | POA: Diagnosis not present

## 2016-10-04 DIAGNOSIS — I5032 Chronic diastolic (congestive) heart failure: Secondary | ICD-10-CM | POA: Diagnosis not present

## 2016-10-04 DIAGNOSIS — Z9981 Dependence on supplemental oxygen: Secondary | ICD-10-CM | POA: Diagnosis not present

## 2016-10-04 DIAGNOSIS — Z794 Long term (current) use of insulin: Secondary | ICD-10-CM | POA: Diagnosis not present

## 2016-10-04 DIAGNOSIS — J449 Chronic obstructive pulmonary disease, unspecified: Secondary | ICD-10-CM | POA: Diagnosis not present

## 2016-10-04 DIAGNOSIS — E782 Mixed hyperlipidemia: Secondary | ICD-10-CM | POA: Diagnosis not present

## 2016-10-04 NOTE — Telephone Encounter (Signed)
Lawson [facility]Lori [facility] stating the patient has no edema or open wounds but they will go out for the next two weeks and if she has any open wounds they will place the unna boots.

## 2016-10-05 DIAGNOSIS — J449 Chronic obstructive pulmonary disease, unspecified: Secondary | ICD-10-CM | POA: Diagnosis not present

## 2016-10-10 DIAGNOSIS — Z794 Long term (current) use of insulin: Secondary | ICD-10-CM | POA: Diagnosis not present

## 2016-10-10 DIAGNOSIS — Z7952 Long term (current) use of systemic steroids: Secondary | ICD-10-CM | POA: Diagnosis not present

## 2016-10-10 DIAGNOSIS — Z7902 Long term (current) use of antithrombotics/antiplatelets: Secondary | ICD-10-CM | POA: Diagnosis not present

## 2016-10-10 DIAGNOSIS — J449 Chronic obstructive pulmonary disease, unspecified: Secondary | ICD-10-CM | POA: Diagnosis not present

## 2016-10-10 DIAGNOSIS — Z9981 Dependence on supplemental oxygen: Secondary | ICD-10-CM | POA: Diagnosis not present

## 2016-10-10 DIAGNOSIS — E119 Type 2 diabetes mellitus without complications: Secondary | ICD-10-CM | POA: Diagnosis not present

## 2016-10-10 DIAGNOSIS — I5032 Chronic diastolic (congestive) heart failure: Secondary | ICD-10-CM | POA: Diagnosis not present

## 2016-10-10 DIAGNOSIS — E782 Mixed hyperlipidemia: Secondary | ICD-10-CM | POA: Diagnosis not present

## 2016-10-10 DIAGNOSIS — I110 Hypertensive heart disease with heart failure: Secondary | ICD-10-CM | POA: Diagnosis not present

## 2016-10-12 DIAGNOSIS — I5032 Chronic diastolic (congestive) heart failure: Secondary | ICD-10-CM | POA: Diagnosis not present

## 2016-10-12 DIAGNOSIS — E782 Mixed hyperlipidemia: Secondary | ICD-10-CM | POA: Diagnosis not present

## 2016-10-12 DIAGNOSIS — Z794 Long term (current) use of insulin: Secondary | ICD-10-CM | POA: Diagnosis not present

## 2016-10-12 DIAGNOSIS — Z9981 Dependence on supplemental oxygen: Secondary | ICD-10-CM | POA: Diagnosis not present

## 2016-10-12 DIAGNOSIS — Z7902 Long term (current) use of antithrombotics/antiplatelets: Secondary | ICD-10-CM | POA: Diagnosis not present

## 2016-10-12 DIAGNOSIS — J449 Chronic obstructive pulmonary disease, unspecified: Secondary | ICD-10-CM | POA: Diagnosis not present

## 2016-10-12 DIAGNOSIS — Z7952 Long term (current) use of systemic steroids: Secondary | ICD-10-CM | POA: Diagnosis not present

## 2016-10-12 DIAGNOSIS — I110 Hypertensive heart disease with heart failure: Secondary | ICD-10-CM | POA: Diagnosis not present

## 2016-10-12 DIAGNOSIS — E119 Type 2 diabetes mellitus without complications: Secondary | ICD-10-CM | POA: Diagnosis not present

## 2016-10-17 ENCOUNTER — Other Ambulatory Visit: Payer: Self-pay | Admitting: Family Medicine

## 2016-10-19 DIAGNOSIS — J449 Chronic obstructive pulmonary disease, unspecified: Secondary | ICD-10-CM | POA: Diagnosis not present

## 2016-10-19 DIAGNOSIS — Z7902 Long term (current) use of antithrombotics/antiplatelets: Secondary | ICD-10-CM | POA: Diagnosis not present

## 2016-10-19 DIAGNOSIS — Z9981 Dependence on supplemental oxygen: Secondary | ICD-10-CM | POA: Diagnosis not present

## 2016-10-19 DIAGNOSIS — E119 Type 2 diabetes mellitus without complications: Secondary | ICD-10-CM | POA: Diagnosis not present

## 2016-10-19 DIAGNOSIS — I5032 Chronic diastolic (congestive) heart failure: Secondary | ICD-10-CM | POA: Diagnosis not present

## 2016-10-19 DIAGNOSIS — I110 Hypertensive heart disease with heart failure: Secondary | ICD-10-CM | POA: Diagnosis not present

## 2016-10-19 DIAGNOSIS — E782 Mixed hyperlipidemia: Secondary | ICD-10-CM | POA: Diagnosis not present

## 2016-10-19 DIAGNOSIS — Z794 Long term (current) use of insulin: Secondary | ICD-10-CM | POA: Diagnosis not present

## 2016-10-19 DIAGNOSIS — Z7952 Long term (current) use of systemic steroids: Secondary | ICD-10-CM | POA: Diagnosis not present

## 2016-10-21 ENCOUNTER — Encounter (INDEPENDENT_AMBULATORY_CARE_PROVIDER_SITE_OTHER): Payer: Self-pay | Admitting: Vascular Surgery

## 2016-10-21 ENCOUNTER — Ambulatory Visit (INDEPENDENT_AMBULATORY_CARE_PROVIDER_SITE_OTHER): Payer: Medicare Other | Admitting: Vascular Surgery

## 2016-10-21 VITALS — BP 127/73 | HR 102 | Resp 16 | Ht 59.0 in | Wt 186.0 lb

## 2016-10-21 DIAGNOSIS — E118 Type 2 diabetes mellitus with unspecified complications: Secondary | ICD-10-CM

## 2016-10-21 DIAGNOSIS — Z794 Long term (current) use of insulin: Secondary | ICD-10-CM

## 2016-10-21 DIAGNOSIS — E785 Hyperlipidemia, unspecified: Secondary | ICD-10-CM | POA: Diagnosis not present

## 2016-10-21 DIAGNOSIS — J418 Mixed simple and mucopurulent chronic bronchitis: Secondary | ICD-10-CM

## 2016-10-21 DIAGNOSIS — M7989 Other specified soft tissue disorders: Secondary | ICD-10-CM | POA: Diagnosis not present

## 2016-10-21 NOTE — Progress Notes (Signed)
MRN : [id number]  Karen [name] is a 75 y.o. (11-11-1941) female who presents with chief complaint of  Chief Complaint  Patient presents with  . Re-evaluation    Unna boot check  .  History of Present Illness: Patient returns today in follow up of Her leg swelling. It is actually significantly better than it was at her last visit. She's been trying to elevate her legs and walk a little more.   Current Outpatient Prescriptions  Medication Sig Dispense Refill  . amLODipine (NORVASC) 10 MG tablet Take 1 tablet (10 mg total) by mouth daily. 90 tablet 0  . B-D INS SYRINGE 0.5CC/31GX5/16 31G X 5/16" 0.5 ML MISC USE TO INJECT INSULIN TWICE DAILY. 100 each 6  . clopidogrel (PLAVIX) 75 MG tablet Take 1 tablet (75 mg total) by mouth daily. 90 tablet 3  . digoxin (LANOXIN) 0.125 MG tablet Take by mouth daily. One every other day    . ferrous sulfate 325 (65 FE) MG EC tablet Take by mouth.    . furosemide (LASIX) 40 MG tablet Take by mouth.    . insulin lispro (HUMALOG KWIKPEN) 100 UNIT/ML KiwkPen Use as directed according to Sliding Scale. 15 mL 2  . insulin NPH-regular Human (HUMULIN 70/30) (70-30) 100 UNIT/ML injection INJECT 50 UNITS IN THE MORNING AND 27 UNITS EACH EVENING 20 mL 2  . Insulin Pen Needle (UNIFINE PENTIPS) 31G X 6 MM MISC Use as directed to inject Insulin. 90 each 3  . ipratropium-albuterol (DUONEB) 0.5-2.5 (3) MG/3ML SOLN Inhale into the lungs.    Marland Kitchen ipratropium-albuterol (DUONEB) 0.5-2.5 (3) MG/3ML SOLN Take 3 mLs by nebulization every 6 (six) hours as needed. 360 mL 2  . levothyroxine (SYNTHROID, LEVOTHROID) 150 MCG tablet TAKE 1 TABLET BY MOUTH ONCE DAILY ON AN EMPTY STOMACH. WAIT 30 MINUTES BEFORE TAKING OTHER MEDS. 90 tablet 0  . losartan (COZAAR) 100 MG tablet Take 1 tablet (100 mg total) by mouth daily. 90 tablet 0  . pantoprazole (PROTONIX) 40 MG tablet TAKE 1 TABLET BY MOUTH ONCE DAILY. 90 tablet 3  . pravastatin (PRAVACHOL) 40 MG tablet Take 1 tablet (40 mg  total) by mouth daily. 90 tablet 0  . predniSONE (DELTASONE) 20 MG tablet TAKE 1 TABLET BY MOUTH ONCE DAILY WITH BREAKFAST. 10 tablet 0  . PULMICORT FLEXHALER 180 MCG/ACT inhaler     . raloxifene (EVISTA) 60 MG tablet Take 1 tablet (60 mg total) by mouth daily. 30 tablet 11  . VENTOLIN HFA 108 (90 BASE) MCG/ACT inhaler      No current facility-administered medications for this visit.         Past Medical History:  Diagnosis Date  . COPD (chronic obstructive pulmonary disease) (HCC)   . Diabetes mellitus without complication (HCC)   . Hyperlipidemia   . Hypertension          Past Surgical History:  Procedure Laterality Date  . CHOLECYSTECTOMY    . NO PAST SURGERIES      Social History     Social History  Substance Use Topics  . Smoking status: Never Smoker  . Smokeless tobacco: Never Used  . Alcohol use No     Family History      Family History  Problem Relation Age of Onset  . Heart disease Mother   . Heart disease Father    No bleeding disorders or clotting disorders      Allergies  Allergen Reactions  . Ace Inhibitors Other (See Comments)  REVIEW OF SYSTEMS (Negative unless checked)  Constitutional: [] Weight loss  [] Fever  [] Chills Cardiac: [] Chest pain   [] Chest pressure   [] Palpitations   [] Shortness of breath when laying flat   [] Shortness of breath at rest   [] Shortness of breath with exertion. Vascular:  [] Pain in legs with walking   [] Pain in legs at rest   [] Pain in legs when laying flat   [] Claudication   [] Pain in feet when walking  [] Pain in feet at rest  [] Pain in feet when laying flat   [] History of DVT   [] Phlebitis   [x] Swelling in legs   [] Varicose veins   [x] Non-healing ulcers Pulmonary:   [x] Uses home oxygen   [x] Productive cough   [] Hemoptysis   [x] Wheeze  [x] COPD   [] Asthma Neurologic:  [] Dizziness  [] Blackouts   [] Seizures   [] History of stroke   [] History of TIA  [] Aphasia   [] Temporary blindness    [] Dysphagia   [] Weakness or numbness in arms   [] Weakness or numbness in legs Musculoskeletal:  [] Arthritis   [] Joint swelling   [] Joint pain   [] Low back pain Hematologic:  [] Easy bruising  [] Easy bleeding   [] Hypercoagulable state   [] Anemic   Gastrointestinal:  [] Blood in stool   [] Vomiting blood  [] Gastroesophageal reflux/heartburn   [] Abdominal pain Genitourinary:  [] Chronic kidney disease   [] Difficult urination  [] Frequent urination  [] Burning with urination   [] Hematuria Skin:  [] Rashes   [x] Ulcers   [x] Wounds Psychological:  [] History of anxiety   []  History of major depression.  Physical Examination  BP 137/70 (BP Location: Right Arm)   Pulse 100   Resp 18   Ht 4\' 11"  (1.499 m)   Wt 185 lb (83.9 kg)   BMI 37.37 kg/m  Gen:  WD/WN, NAD. Debilitated appearing Head: [place]/AT, No temporalis wasting. Ear/Nose/Throat: Hearing grossly intact, nares w/o erythema or drainage, trachea midline Eyes: Conjunctiva clear. Sclera non-icteric Neck: Supple.  No JVD.  Pulmonary:  Good air movement, wheezes present bilaterally with respirations labored on supplemental oxygen Cardiac: RRR, normal S1, S2 Vascular:  Vessel Right Left  Radial Palpable Palpable  Ulnar Palpable Palpable  Brachial Palpable Palpable  Carotid Palpable, without bruit Palpable, without bruit  Aorta Not palpable N/A  Femoral Palpable Palpable  Popliteal Palpable Palpable  PT 1+ Palpable Not Palpable  DP Palpable Palpable   Gastrointestinal: soft, non-tender/non-distended. No guarding/reflex.  Musculoskeletal: M/S 5/5 throughout.  No deformity or atrophy. 1+ bilateral lower extremity edema. Neurologic: Sensation grossly intact in extremities.  Symmetrical.  Speech is fluent.  Psychiatric: Judgment intact, Mood & affect appropriate for pt's clinical situation. Dermatologic: previous ulcer has healed Lymph : No Cervical, Axillary, or Inguinal lymphadenopathy.      Labs Recent Results (from the past 2160  hour(s))  COMPLETE METABOLIC PANEL WITH GFR     Status: Abnormal   Collection Time: 09/09/16  8:38 AM  Result Value Ref Range   Sodium 144 135 - 146 mmol/L   Potassium 3.9 3.5 - 5.3 mmol/L   Chloride 104 98 - 110 mmol/L   CO2 33 (H) 20 - 31 mmol/L   Glucose, Bld 147 (H) 65 - 99 mg/dL   BUN 17 7 - 25 mg/dL   Creat 7.820.82 9.560.60 - 2.130.93 mg/dL    Comment:   For patients > or = 75 years of age: The upper reference limit for Creatinine is approximately 13% higher for people identified as African-American.      Total Bilirubin 0.5 0.2 - 1.2 mg/dL  Alkaline Phosphatase 41 33 - 130 U/L   AST 15 10 - 35 U/L   ALT 12 6 - 29 U/L   Total Protein 5.9 (L) 6.1 - 8.1 g/dL   Albumin 3.5 (L) 3.6 - 5.1 g/dL   Calcium 8.6 8.6 - 16.1 mg/dL   GFR, Est African American 81 >=60 mL/min   GFR, Est Non African American 70 >=60 mL/min  Microalbumin / creatinine urine ratio     Status: None   Collection Time: 09/09/16  8:38 AM  Result Value Ref Range   Creatinine, Urine 98 20 - 320 mg/dL   Microalb, Ur 2.5 Not estab mg/dL   Microalb Creat Ratio 26 <30 mcg/mg creat    Comment: The ADA has defined abnormalities in albumin excretion as follows:           Category           Result                            (mcg/mg creatinine)                 Normal:    <30       Microalbuminuria:    30 - 299   Clinical albuminuria:    > or = 300   The ADA recommends that at least two of three specimens collected within a 3 - 6 month period be abnormal before considering a patient to be within a diagnostic category.     Lipid panel     Status: None   Collection Time: 09/09/16  8:38 AM  Result Value Ref Range   Cholesterol 176 125 - 200 mg/dL   Triglycerides 94 <096 mg/dL   HDL 72 >=04 mg/dL   Total CHOL/HDL Ratio 2.4 <=5.0 Ratio   VLDL 19 <30 mg/dL   LDL Cholesterol 85 <540 mg/dL    Comment:   Total Cholesterol/HDL Ratio:CHD Risk                        Coronary Heart Disease Risk Table                                         Men       Women          1/2 Average Risk              3.4        3.3              Average Risk              5.0        4.4           2X Average Risk              9.6        7.1           3X Average Risk             23.4       11.0 Use the calculated Patient Ratio above and the CHD Risk table  to determine the patient's CHD Risk.     Radiology No results found.   Assessment/Plan  Hyperlipidemia lipid control important in reducing the progression of atherosclerotic disease. Continue statin therapy   Diabetes  blood glucose control important in reducing the progression of atherosclerotic disease. Also, involved in wound healing. On appropriate medications.   Chronic obstructive pulmonary disease (HCC) On continuous oxygen  Swelling of limb Improved and her skin has healed since her last visit. Continue leg elevation, compression stockings, and exercises much as possible. Plan to return in about 6 months or sooner if problems develop in the interim.    Festus BarrenJason Shatana Saxton, MD  10/21/2016 11:43 AM    This note was created with Dragon medical transcription system.  Any errors from dictation are purely unintentional

## 2016-10-21 NOTE — Assessment & Plan Note (Signed)
Improved and her skin has healed since her last visit. Continue leg elevation, compression stockings, and exercises much as possible. Plan to return in about 6 months or sooner if problems develop in the interim.

## 2016-10-21 NOTE — Assessment & Plan Note (Signed)
On continuous oxygen 

## 2016-10-21 NOTE — Assessment & Plan Note (Signed)
lipid control important in reducing the progression of atherosclerotic disease. Continue statin therapy  

## 2016-10-21 NOTE — Assessment & Plan Note (Signed)
blood glucose control important in reducing the progression of atherosclerotic disease. Also, involved in wound healing. On appropriate medications.  

## 2016-10-28 DIAGNOSIS — Z9981 Dependence on supplemental oxygen: Secondary | ICD-10-CM | POA: Diagnosis not present

## 2016-10-28 DIAGNOSIS — E119 Type 2 diabetes mellitus without complications: Secondary | ICD-10-CM | POA: Diagnosis not present

## 2016-10-28 DIAGNOSIS — Z7952 Long term (current) use of systemic steroids: Secondary | ICD-10-CM | POA: Diagnosis not present

## 2016-10-28 DIAGNOSIS — I5032 Chronic diastolic (congestive) heart failure: Secondary | ICD-10-CM | POA: Diagnosis not present

## 2016-10-28 DIAGNOSIS — J449 Chronic obstructive pulmonary disease, unspecified: Secondary | ICD-10-CM | POA: Diagnosis not present

## 2016-10-28 DIAGNOSIS — I110 Hypertensive heart disease with heart failure: Secondary | ICD-10-CM | POA: Diagnosis not present

## 2016-10-28 DIAGNOSIS — Z794 Long term (current) use of insulin: Secondary | ICD-10-CM | POA: Diagnosis not present

## 2016-10-28 DIAGNOSIS — Z7902 Long term (current) use of antithrombotics/antiplatelets: Secondary | ICD-10-CM | POA: Diagnosis not present

## 2016-10-28 DIAGNOSIS — E782 Mixed hyperlipidemia: Secondary | ICD-10-CM | POA: Diagnosis not present

## 2016-10-29 DIAGNOSIS — J441 Chronic obstructive pulmonary disease with (acute) exacerbation: Secondary | ICD-10-CM | POA: Diagnosis not present

## 2016-10-31 DIAGNOSIS — J449 Chronic obstructive pulmonary disease, unspecified: Secondary | ICD-10-CM | POA: Diagnosis not present

## 2016-10-31 DIAGNOSIS — R0602 Shortness of breath: Secondary | ICD-10-CM | POA: Diagnosis not present

## 2016-11-03 ENCOUNTER — Other Ambulatory Visit: Payer: Self-pay | Admitting: Family Medicine

## 2016-11-03 DIAGNOSIS — Z7902 Long term (current) use of antithrombotics/antiplatelets: Secondary | ICD-10-CM | POA: Diagnosis not present

## 2016-11-03 DIAGNOSIS — Z7952 Long term (current) use of systemic steroids: Secondary | ICD-10-CM | POA: Diagnosis not present

## 2016-11-03 DIAGNOSIS — J449 Chronic obstructive pulmonary disease, unspecified: Secondary | ICD-10-CM | POA: Diagnosis not present

## 2016-11-03 DIAGNOSIS — I110 Hypertensive heart disease with heart failure: Secondary | ICD-10-CM | POA: Diagnosis not present

## 2016-11-03 DIAGNOSIS — E782 Mixed hyperlipidemia: Secondary | ICD-10-CM | POA: Diagnosis not present

## 2016-11-03 DIAGNOSIS — I5032 Chronic diastolic (congestive) heart failure: Secondary | ICD-10-CM | POA: Diagnosis not present

## 2016-11-03 DIAGNOSIS — Z794 Long term (current) use of insulin: Secondary | ICD-10-CM | POA: Diagnosis not present

## 2016-11-03 DIAGNOSIS — Z9981 Dependence on supplemental oxygen: Secondary | ICD-10-CM | POA: Diagnosis not present

## 2016-11-03 DIAGNOSIS — E119 Type 2 diabetes mellitus without complications: Secondary | ICD-10-CM | POA: Diagnosis not present

## 2016-11-17 DIAGNOSIS — Z9981 Dependence on supplemental oxygen: Secondary | ICD-10-CM | POA: Diagnosis not present

## 2016-11-17 DIAGNOSIS — E782 Mixed hyperlipidemia: Secondary | ICD-10-CM | POA: Diagnosis not present

## 2016-11-17 DIAGNOSIS — I110 Hypertensive heart disease with heart failure: Secondary | ICD-10-CM | POA: Diagnosis not present

## 2016-11-17 DIAGNOSIS — Z7902 Long term (current) use of antithrombotics/antiplatelets: Secondary | ICD-10-CM | POA: Diagnosis not present

## 2016-11-17 DIAGNOSIS — E119 Type 2 diabetes mellitus without complications: Secondary | ICD-10-CM | POA: Diagnosis not present

## 2016-11-17 DIAGNOSIS — J449 Chronic obstructive pulmonary disease, unspecified: Secondary | ICD-10-CM | POA: Diagnosis not present

## 2016-11-17 DIAGNOSIS — Z7952 Long term (current) use of systemic steroids: Secondary | ICD-10-CM | POA: Diagnosis not present

## 2016-11-17 DIAGNOSIS — Z794 Long term (current) use of insulin: Secondary | ICD-10-CM | POA: Diagnosis not present

## 2016-11-17 DIAGNOSIS — I5032 Chronic diastolic (congestive) heart failure: Secondary | ICD-10-CM | POA: Diagnosis not present

## 2016-11-21 ENCOUNTER — Other Ambulatory Visit: Payer: Self-pay | Admitting: Family Medicine

## 2016-11-22 ENCOUNTER — Other Ambulatory Visit: Payer: Self-pay | Admitting: Emergency Medicine

## 2016-11-22 MED ORDER — INSULIN NPH ISOPHANE & REGULAR (70-30) 100 UNIT/ML ~~LOC~~ SUSP: SUBCUTANEOUS | 0 refills | Status: DC

## 2016-11-25 DIAGNOSIS — Z7952 Long term (current) use of systemic steroids: Secondary | ICD-10-CM | POA: Diagnosis not present

## 2016-11-25 DIAGNOSIS — J449 Chronic obstructive pulmonary disease, unspecified: Secondary | ICD-10-CM | POA: Diagnosis not present

## 2016-11-25 DIAGNOSIS — I110 Hypertensive heart disease with heart failure: Secondary | ICD-10-CM | POA: Diagnosis not present

## 2016-11-25 DIAGNOSIS — Z7902 Long term (current) use of antithrombotics/antiplatelets: Secondary | ICD-10-CM | POA: Diagnosis not present

## 2016-11-25 DIAGNOSIS — Z794 Long term (current) use of insulin: Secondary | ICD-10-CM | POA: Diagnosis not present

## 2016-11-25 DIAGNOSIS — E782 Mixed hyperlipidemia: Secondary | ICD-10-CM | POA: Diagnosis not present

## 2016-11-25 DIAGNOSIS — E119 Type 2 diabetes mellitus without complications: Secondary | ICD-10-CM | POA: Diagnosis not present

## 2016-11-25 DIAGNOSIS — I5032 Chronic diastolic (congestive) heart failure: Secondary | ICD-10-CM | POA: Diagnosis not present

## 2016-11-25 DIAGNOSIS — Z9981 Dependence on supplemental oxygen: Secondary | ICD-10-CM | POA: Diagnosis not present

## 2016-11-28 DIAGNOSIS — J441 Chronic obstructive pulmonary disease with (acute) exacerbation: Secondary | ICD-10-CM | POA: Diagnosis not present

## 2016-12-02 DIAGNOSIS — Z9981 Dependence on supplemental oxygen: Secondary | ICD-10-CM | POA: Diagnosis not present

## 2016-12-02 DIAGNOSIS — E782 Mixed hyperlipidemia: Secondary | ICD-10-CM | POA: Diagnosis not present

## 2016-12-02 DIAGNOSIS — J449 Chronic obstructive pulmonary disease, unspecified: Secondary | ICD-10-CM | POA: Diagnosis not present

## 2016-12-02 DIAGNOSIS — E119 Type 2 diabetes mellitus without complications: Secondary | ICD-10-CM | POA: Diagnosis not present

## 2016-12-02 DIAGNOSIS — I5032 Chronic diastolic (congestive) heart failure: Secondary | ICD-10-CM | POA: Diagnosis not present

## 2016-12-02 DIAGNOSIS — Z7902 Long term (current) use of antithrombotics/antiplatelets: Secondary | ICD-10-CM | POA: Diagnosis not present

## 2016-12-02 DIAGNOSIS — I110 Hypertensive heart disease with heart failure: Secondary | ICD-10-CM | POA: Diagnosis not present

## 2016-12-02 DIAGNOSIS — Z7952 Long term (current) use of systemic steroids: Secondary | ICD-10-CM | POA: Diagnosis not present

## 2016-12-02 DIAGNOSIS — Z794 Long term (current) use of insulin: Secondary | ICD-10-CM | POA: Diagnosis not present

## 2016-12-07 ENCOUNTER — Ambulatory Visit: Payer: Medicare Other | Admitting: Podiatry

## 2016-12-09 ENCOUNTER — Ambulatory Visit: Payer: Medicare Other | Admitting: Family Medicine

## 2016-12-10 DIAGNOSIS — Z7952 Long term (current) use of systemic steroids: Secondary | ICD-10-CM | POA: Diagnosis not present

## 2016-12-10 DIAGNOSIS — Z794 Long term (current) use of insulin: Secondary | ICD-10-CM | POA: Diagnosis not present

## 2016-12-10 DIAGNOSIS — I110 Hypertensive heart disease with heart failure: Secondary | ICD-10-CM | POA: Diagnosis not present

## 2016-12-10 DIAGNOSIS — Z9981 Dependence on supplemental oxygen: Secondary | ICD-10-CM | POA: Diagnosis not present

## 2016-12-10 DIAGNOSIS — J449 Chronic obstructive pulmonary disease, unspecified: Secondary | ICD-10-CM | POA: Diagnosis not present

## 2016-12-10 DIAGNOSIS — I5032 Chronic diastolic (congestive) heart failure: Secondary | ICD-10-CM | POA: Diagnosis not present

## 2016-12-10 DIAGNOSIS — Z7902 Long term (current) use of antithrombotics/antiplatelets: Secondary | ICD-10-CM | POA: Diagnosis not present

## 2016-12-10 DIAGNOSIS — E782 Mixed hyperlipidemia: Secondary | ICD-10-CM | POA: Diagnosis not present

## 2016-12-10 DIAGNOSIS — E119 Type 2 diabetes mellitus without complications: Secondary | ICD-10-CM | POA: Diagnosis not present

## 2016-12-16 ENCOUNTER — Other Ambulatory Visit: Payer: Self-pay | Admitting: Family Medicine

## 2016-12-17 DIAGNOSIS — Z7902 Long term (current) use of antithrombotics/antiplatelets: Secondary | ICD-10-CM | POA: Diagnosis not present

## 2016-12-17 DIAGNOSIS — Z9981 Dependence on supplemental oxygen: Secondary | ICD-10-CM | POA: Diagnosis not present

## 2016-12-17 DIAGNOSIS — E119 Type 2 diabetes mellitus without complications: Secondary | ICD-10-CM | POA: Diagnosis not present

## 2016-12-17 DIAGNOSIS — I110 Hypertensive heart disease with heart failure: Secondary | ICD-10-CM | POA: Diagnosis not present

## 2016-12-17 DIAGNOSIS — E782 Mixed hyperlipidemia: Secondary | ICD-10-CM | POA: Diagnosis not present

## 2016-12-17 DIAGNOSIS — I5032 Chronic diastolic (congestive) heart failure: Secondary | ICD-10-CM | POA: Diagnosis not present

## 2016-12-17 DIAGNOSIS — Z7952 Long term (current) use of systemic steroids: Secondary | ICD-10-CM | POA: Diagnosis not present

## 2016-12-17 DIAGNOSIS — Z794 Long term (current) use of insulin: Secondary | ICD-10-CM | POA: Diagnosis not present

## 2016-12-17 DIAGNOSIS — J449 Chronic obstructive pulmonary disease, unspecified: Secondary | ICD-10-CM | POA: Diagnosis not present

## 2016-12-19 ENCOUNTER — Ambulatory Visit
Admission: RE | Admit: 2016-12-19 | Discharge: 2016-12-19 | Disposition: A | Discharge status: Home / Self Care | Payer: Medicare Other | Source: Ambulatory Visit | Attending: Family Medicine | Admitting: Family Medicine

## 2016-12-19 ENCOUNTER — Ambulatory Visit (INDEPENDENT_AMBULATORY_CARE_PROVIDER_SITE_OTHER): Payer: Medicare Other | Admitting: Family Medicine

## 2016-12-19 ENCOUNTER — Encounter: Payer: Self-pay | Admitting: Family Medicine

## 2016-12-19 VITALS — BP 124/70 | HR 105 | Temp 98.1°F | Resp 16 | Ht 59.0 in | Wt 187.7 lb

## 2016-12-19 DIAGNOSIS — R05 Cough: Secondary | ICD-10-CM | POA: Diagnosis not present

## 2016-12-19 DIAGNOSIS — E785 Hyperlipidemia, unspecified: Secondary | ICD-10-CM | POA: Diagnosis not present

## 2016-12-19 DIAGNOSIS — E1121 Type 2 diabetes mellitus with diabetic nephropathy: Secondary | ICD-10-CM

## 2016-12-19 DIAGNOSIS — I10 Essential (primary) hypertension: Secondary | ICD-10-CM

## 2016-12-19 DIAGNOSIS — E1165 Type 2 diabetes mellitus with hyperglycemia: Secondary | ICD-10-CM | POA: Diagnosis not present

## 2016-12-19 DIAGNOSIS — J441 Chronic obstructive pulmonary disease with (acute) exacerbation: Secondary | ICD-10-CM

## 2016-12-19 DIAGNOSIS — IMO0002 Reserved for concepts with insufficient information to code with codable children: Secondary | ICD-10-CM

## 2016-12-19 LAB — POCT GLYCOSYLATED HEMOGLOBIN (HGB A1C): Hemoglobin A1C: 7.9

## 2016-12-19 LAB — GLUCOSE, POCT (MANUAL RESULT ENTRY): POC Glucose: 183 mg/dl — AB (ref 70–99)

## 2016-12-19 MED ORDER — PRAVASTATIN SODIUM 40 MG PO TABS: 40.0000 mg | ORAL_TABLET | Freq: Every day | ORAL | 1 refills | Status: DC

## 2016-12-19 MED ORDER — DOXYCYCLINE HYCLATE 100 MG PO TABS: 100.0000 mg | ORAL_TABLET | Freq: Two times a day (BID) | ORAL | 0 refills | Status: DC

## 2016-12-19 MED ORDER — LOSARTAN POTASSIUM 100 MG PO TABS: 100.0000 mg | ORAL_TABLET | Freq: Every day | ORAL | 0 refills | Status: DC

## 2016-12-19 MED ORDER — PREDNISONE 10 MG (21) PO TBPK: 10.0000 mg | ORAL_TABLET | Freq: Every day | ORAL | 0 refills | Status: DC

## 2016-12-19 MED ORDER — "BD INSULIN SYR ULTRAFINE II 31G X 5/16"" 0.5 ML MISC": 6 refills | Status: DC

## 2016-12-19 MED ORDER — INSULIN LISPRO 100 UNIT/ML (KWIKPEN): PEN_INJECTOR | SUBCUTANEOUS | 2 refills | Status: DC

## 2016-12-19 MED ORDER — AMLODIPINE BESYLATE 10 MG PO TABS: 10.0000 mg | ORAL_TABLET | Freq: Every day | ORAL | 0 refills | Status: DC

## 2016-12-19 MED ORDER — INSULIN NPH ISOPHANE & REGULAR (70-30) 100 UNIT/ML ~~LOC~~ SUSP: SUBCUTANEOUS | 0 refills | Status: DC

## 2016-12-19 NOTE — Progress Notes (Signed)
Name: Karen [name]   MRN: [id number]    DOB: 1941-06-23   Date:12/19/2016       Progress Note  Subjective  Chief Complaint  Chief Complaint  Patient presents with  . Hypertension    follow up, medication refill  . Diabetes    Hypertension  This is a chronic problem. The problem is unchanged. The problem is controlled. Associated symptoms include headaches and palpitations (has hx of A.fib, on digoxin, prescribed by Cardiolgoy). Pertinent negatives include no blurred vision, chest pain, orthopnea or shortness of breath. Past treatments include calcium channel blockers and angiotensin blockers. Hypertensive end-organ damage includes CVA (hx of mini stroke in the past). There is no history of kidney disease or CAD/MI.  Diabetes  She presents for her follow-up diabetic visit. She has type 2 diabetes mellitus. Her disease course has been worsening. Hypoglycemia symptoms include headaches. Associated symptoms include polydipsia and polyuria. Pertinent negatives for diabetes include no blurred vision, no chest pain, no fatigue and no foot paresthesias. (She is on diuretic prescribed by Cardiology.) Diabetic complications include a CVA (hx of mini stroke in the past). Current diabetic treatment includes intensive insulin program. She is following a diabetic diet. She participates in exercise daily. Her breakfast blood glucose range is generally 140-180 mg/dl.  Hyperlipidemia  This is a chronic problem. The problem is controlled. Recent lipid tests were reviewed and are normal. Pertinent negatives include no chest pain, leg pain, myalgias or shortness of breath. Current antihyperlipidemic treatment includes statins.   Pt. Has COPD and Asthma, long history, on maintenance dose of Prednisone 10 mg daily, she has been coughing and wheezing more than usual, sometimes with whitish mucus. In the past, she has been on Prednisone taper for symptom of COPD and Asthma exacerbations.  COPD and Asthma is being  managed by Pulmonology.  Past Medical History:  Diagnosis Date  . COPD (chronic obstructive pulmonary disease) (HCC)   . Diabetes mellitus without complication (HCC)   . Hyperlipidemia   . Hypertension     Past Surgical History:  Procedure Laterality Date  . CHOLECYSTECTOMY    . NO PAST SURGERIES      Family History  Problem Relation Age of Onset  . Heart disease Mother   . Heart disease Father     Social History   Social History  . Marital status: Single    Spouse name: N/A  . Number of children: N/A  . Years of education: N/A   Occupational History  . Not on file.   Social History Main Topics  . Smoking status: Never Smoker  . Smokeless tobacco: Never Used  . Alcohol use No  . Drug use: No  . Sexual activity: No   Other Topics Concern  . Not on file   Social History Narrative  . No narrative on file     Current Outpatient Prescriptions:  .  amLODipine (NORVASC) 10 MG tablet, TAKE 1 TABLET BY MOUTH ONCE DAILY, Disp: 90 tablet, Rfl: 0 .  B-D INS SYRINGE 0.5CC/31GX5/16 31G X 5/16" 0.5 ML MISC, USE TO INJECT INSULIN TWICE DAILY., Disp: 100 each, Rfl: 6 .  clopidogrel (PLAVIX) 75 MG tablet, Take 1 tablet (75 mg total) by mouth daily., Disp: 90 tablet, Rfl: 3 .  digoxin (LANOXIN) 0.125 MG tablet, Take by mouth daily. One every other day, Disp: , Rfl:  .  ferrous sulfate 325 (65 FE) MG EC tablet, Take by mouth., Disp: , Rfl:  .  insulin lispro (HUMALOG KWIKPEN) 100  UNIT/ML KiwkPen, Use as directed according to Sliding Scale., Disp: 15 mL, Rfl: 2 .  insulin NPH-regular Human (HUMULIN 70/30) (70-30) 100 UNIT/ML injection, INJECT 50 UNITS IN THE MORNING AND 27 UNITS EACH EVENING, Disp: 20 mL, Rfl: 0 .  Insulin Pen Needle (UNIFINE PENTIPS) 31G X 6 MM MISC, Use as directed to inject Insulin., Disp: 90 each, Rfl: 3 .  ipratropium-albuterol (DUONEB) 0.5-2.5 (3) MG/3ML SOLN, Inhale into the lungs., Disp: , Rfl:  .  ipratropium-albuterol (DUONEB) 0.5-2.5 (3) MG/3ML SOLN,  USE 1 VIAL (3MLS)VIA NEBULIZER EVERY 6 HOURS AS NEEDED, Disp: 360 mL, Rfl: 2 .  levothyroxine (SYNTHROID, LEVOTHROID) 150 MCG tablet, TAKE 1 TABLET BY MOUTH ONCE DAILY ON AN EMPTY STOMACH. WAIT 30 MINUTES BEFORE TAKING OTHER MEDS., Disp: 90 tablet, Rfl: 0 .  losartan (COZAAR) 100 MG tablet, Take 1 tablet (100 mg total) by mouth daily., Disp: 90 tablet, Rfl: 0 .  pantoprazole (PROTONIX) 40 MG tablet, TAKE 1 TABLET BY MOUTH ONCE DAILY., Disp: 90 tablet, Rfl: 3 .  pravastatin (PRAVACHOL) 40 MG tablet, TAKE 1 TABLET BY MOUTH ONCE DAILY, Disp: 90 tablet, Rfl: 1 .  predniSONE (DELTASONE) 10 MG tablet, , Disp: , Rfl:  .  predniSONE (DELTASONE) 20 MG tablet, TAKE 1 TABLET BY MOUTH ONCE DAILY WITH BREAKFAST., Disp: 10 tablet, Rfl: 0 .  PULMICORT FLEXHALER 180 MCG/ACT inhaler, , Disp: , Rfl:  .  raloxifene (EVISTA) 60 MG tablet, Take 1 tablet (60 mg total) by mouth daily., Disp: 30 tablet, Rfl: 11 .  VENTOLIN HFA 108 (90 BASE) MCG/ACT inhaler, , Disp: , Rfl:   Allergies  Allergen Reactions  . Ace Inhibitors Other (See Comments)     Review of Systems  Constitutional: Negative for fatigue.  Eyes: Negative for blurred vision.  Respiratory: Negative for shortness of breath.   Cardiovascular: Positive for palpitations (has hx of A.fib, on digoxin, prescribed by Cardiolgoy). Negative for chest pain and orthopnea.  Musculoskeletal: Negative for myalgias.  Neurological: Positive for headaches.  Endo/Heme/Allergies: Positive for polydipsia.    Objective  Vitals:   12/19/16 1343  BP: 124/70  Pulse: (!) 105  Resp: 16  Temp: 98.1 F (36.7 C)  TempSrc: Oral  SpO2: 94%  Weight: 187 lb 11.2 oz (85.1 kg)  Height: 4\' 11"  (1.499 m)    Physical Exam  Constitutional: She is well-developed, well-nourished, and in no distress.  Cardiovascular: Regular rhythm, S1 normal, S2 normal and normal heart sounds.  Tachycardia present.   No murmur heard. Pulmonary/Chest: No respiratory distress. She has  decreased breath sounds. She has wheezes. She has no rhonchi. She has no rales.  Abdominal: Soft. Bowel sounds are normal. There is no tenderness.  Musculoskeletal:       Right ankle: She exhibits swelling. No tenderness.       Left ankle: She exhibits swelling. No tenderness.  Psychiatric: Mood, memory, affect and judgment normal.  Nursing note and vitals reviewed.      Recent Results (from the past 2160 hour(s))  POCT Glucose (CBG)     Status: Abnormal   Collection Time: 12/19/16  1:48 PM  Result Value Ref Range   POC Glucose 183 (A) 70 - 99 mg/dl  POCT HgB [id number]     Status: None   Collection Time: 12/19/16  1:50 PM  Result Value Ref Range   Hemoglobin A1C 7.9      Assessment & Plan  1. 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 - POCT HgB A1C - POCT Glucose (CBG) - insulin NPH-regular Human (HUMULIN 70/30) (70-30) 100 UNIT/ML injection; INJECT 50 UNITS IN THE MORNING AND 27 UNITS EACH EVENING  Dispense: 20 mL; Refill: 0 - insulin lispro (HUMALOG KWIKPEN) 100 UNIT/ML KiwkPen; Use as directed according to Sliding Scale.  Dispense: 15 mL; Refill: 2 - B-D INS SYRINGE 0.5CC/31GX5/16 31G X 5/16" 0.5 ML MISC; USE TO INJECT INSULIN TWICE DAILY.  Dispense: 100 each; Refill: 6  2. Hyperlipidemia, unspecified hyperlipidemia type  - Lipid Profile - COMPLETE METABOLIC PANEL WITH GFR - pravastatin (PRAVACHOL) 40 MG tablet; Take 1 tablet (40 mg total) by mouth daily.  Dispense: 90 tablet; Refill: 1  3. Essential hypertension  - losartan (COZAAR) 100 MG tablet; Take 1 tablet (100 mg total) by mouth daily.  Dispense: 90 tablet; Refill: 0 - amLODipine (NORVASC) 10 MG tablet; Take 1 tablet (10 mg total) by mouth daily.  Dispense: 90 tablet; Refill: 0  4. COPD exacerbation (HCC) Start on antibiotic and prednisone taper. Obtain CXR - predniSONE (STERAPRED UNI-PAK 21 TAB) 10 MG (21) TBPK  tablet; Take 1 tablet (10 mg total) by mouth daily. 60 50 40 30 20 10  then STOP  Dispense: 21 tablet; Refill: 0 - DG Chest 2 View; Future - doxycycline (VIBRA-TABS) 100 MG tablet; Take 1 tablet (100 mg total) by mouth 2 (two) times daily.  Dispense: 14 tablet; Refill: 0  Malyiah Fellows [facility] 12/19/2016 2:00 PM

## 2016-12-20 ENCOUNTER — Telehealth: Payer: Self-pay | Admitting: Family Medicine

## 2016-12-20 ENCOUNTER — Other Ambulatory Visit: Payer: Self-pay | Admitting: Family Medicine

## 2016-12-20 DIAGNOSIS — J441 Chronic obstructive pulmonary disease with (acute) exacerbation: Secondary | ICD-10-CM

## 2016-12-20 NOTE — Telephone Encounter (Signed)
Yesterday pt was prescribed doxycycline. The directions says for patient not to take dairy products, antacids, or iron. However patient has been taking these products so she fear that it will counter act. Asking that you please call in something different. Uses [facility]

## 2016-12-21 MED ORDER — AMOXICILLIN-POT CLAVULANATE 875-125 MG PO TABS: 1.0000 | ORAL_TABLET | Freq: Two times a day (BID) | ORAL | 0 refills | Status: DC

## 2016-12-21 NOTE — Telephone Encounter (Signed)
Patient has been started on Amoxicillin-Clavulanate 875-125 mg every 12 hours x 10 days.

## 2016-12-29 DIAGNOSIS — J441 Chronic obstructive pulmonary disease with (acute) exacerbation: Secondary | ICD-10-CM | POA: Diagnosis not present

## 2016-12-30 DIAGNOSIS — E119 Type 2 diabetes mellitus without complications: Secondary | ICD-10-CM | POA: Diagnosis not present

## 2016-12-30 DIAGNOSIS — Z9981 Dependence on supplemental oxygen: Secondary | ICD-10-CM | POA: Diagnosis not present

## 2016-12-30 DIAGNOSIS — Z794 Long term (current) use of insulin: Secondary | ICD-10-CM | POA: Diagnosis not present

## 2016-12-30 DIAGNOSIS — Z7902 Long term (current) use of antithrombotics/antiplatelets: Secondary | ICD-10-CM | POA: Diagnosis not present

## 2016-12-30 DIAGNOSIS — J449 Chronic obstructive pulmonary disease, unspecified: Secondary | ICD-10-CM | POA: Diagnosis not present

## 2016-12-30 DIAGNOSIS — I110 Hypertensive heart disease with heart failure: Secondary | ICD-10-CM | POA: Diagnosis not present

## 2016-12-30 DIAGNOSIS — E782 Mixed hyperlipidemia: Secondary | ICD-10-CM | POA: Diagnosis not present

## 2016-12-30 DIAGNOSIS — Z7952 Long term (current) use of systemic steroids: Secondary | ICD-10-CM | POA: Diagnosis not present

## 2016-12-30 DIAGNOSIS — I5032 Chronic diastolic (congestive) heart failure: Secondary | ICD-10-CM | POA: Diagnosis not present

## 2017-01-09 DIAGNOSIS — I110 Hypertensive heart disease with heart failure: Secondary | ICD-10-CM | POA: Diagnosis not present

## 2017-01-09 DIAGNOSIS — Z7902 Long term (current) use of antithrombotics/antiplatelets: Secondary | ICD-10-CM | POA: Diagnosis not present

## 2017-01-09 DIAGNOSIS — E782 Mixed hyperlipidemia: Secondary | ICD-10-CM | POA: Diagnosis not present

## 2017-01-09 DIAGNOSIS — E119 Type 2 diabetes mellitus without complications: Secondary | ICD-10-CM | POA: Diagnosis not present

## 2017-01-09 DIAGNOSIS — J449 Chronic obstructive pulmonary disease, unspecified: Secondary | ICD-10-CM | POA: Diagnosis not present

## 2017-01-09 DIAGNOSIS — Z9981 Dependence on supplemental oxygen: Secondary | ICD-10-CM | POA: Diagnosis not present

## 2017-01-09 DIAGNOSIS — Z794 Long term (current) use of insulin: Secondary | ICD-10-CM | POA: Diagnosis not present

## 2017-01-09 DIAGNOSIS — Z7952 Long term (current) use of systemic steroids: Secondary | ICD-10-CM | POA: Diagnosis not present

## 2017-01-09 DIAGNOSIS — I5032 Chronic diastolic (congestive) heart failure: Secondary | ICD-10-CM | POA: Diagnosis not present

## 2017-01-16 ENCOUNTER — Other Ambulatory Visit: Payer: Self-pay | Admitting: Family Medicine

## 2017-01-16 DIAGNOSIS — I5033 Acute on chronic diastolic (congestive) heart failure: Secondary | ICD-10-CM | POA: Diagnosis not present

## 2017-01-16 DIAGNOSIS — R0602 Shortness of breath: Secondary | ICD-10-CM | POA: Diagnosis not present

## 2017-01-16 DIAGNOSIS — R05 Cough: Secondary | ICD-10-CM | POA: Diagnosis not present

## 2017-01-16 DIAGNOSIS — J449 Chronic obstructive pulmonary disease, unspecified: Secondary | ICD-10-CM | POA: Diagnosis not present

## 2017-01-17 ENCOUNTER — Telehealth: Payer: Self-pay

## 2017-01-17 MED ORDER — PANTOPRAZOLE SODIUM 40 MG PO TBEC: 40.0000 mg | DELAYED_RELEASE_TABLET | Freq: Every day | ORAL | 0 refills | Status: DC

## 2017-01-17 NOTE — Telephone Encounter (Signed)
Medication has been refilled and sent to [facility] 

## 2017-01-17 NOTE — Telephone Encounter (Signed)
Patient has requested medication too soon medication has been refilled with a #90 day supply on 12/19/2016

## 2017-01-24 ENCOUNTER — Other Ambulatory Visit: Payer: Self-pay | Admitting: Family Medicine

## 2017-01-24 DIAGNOSIS — E1121 Type 2 diabetes mellitus with diabetic nephropathy: Secondary | ICD-10-CM

## 2017-01-24 DIAGNOSIS — E1165 Type 2 diabetes mellitus with hyperglycemia: Principal | ICD-10-CM

## 2017-01-24 DIAGNOSIS — IMO0002 Reserved for concepts with insufficient information to code with codable children: Secondary | ICD-10-CM

## 2017-01-25 ENCOUNTER — Ambulatory Visit
Admission: RE | Admit: 2017-01-25 | Discharge: 2017-01-25 | Disposition: A | Discharge status: Home / Self Care | Payer: Medicare Other | Source: Ambulatory Visit | Attending: Internal Medicine | Admitting: Internal Medicine

## 2017-01-25 ENCOUNTER — Other Ambulatory Visit: Payer: Self-pay | Admitting: Internal Medicine

## 2017-01-25 DIAGNOSIS — I119 Hypertensive heart disease without heart failure: Secondary | ICD-10-CM | POA: Insufficient documentation

## 2017-01-25 DIAGNOSIS — J449 Chronic obstructive pulmonary disease, unspecified: Secondary | ICD-10-CM | POA: Insufficient documentation

## 2017-01-25 DIAGNOSIS — I700 Atherosclerosis of aorta: Secondary | ICD-10-CM | POA: Diagnosis not present

## 2017-01-25 DIAGNOSIS — R0602 Shortness of breath: Secondary | ICD-10-CM | POA: Insufficient documentation

## 2017-01-25 DIAGNOSIS — I5033 Acute on chronic diastolic (congestive) heart failure: Secondary | ICD-10-CM | POA: Diagnosis not present

## 2017-01-25 DIAGNOSIS — R05 Cough: Secondary | ICD-10-CM | POA: Diagnosis not present

## 2017-01-27 DIAGNOSIS — E782 Mixed hyperlipidemia: Secondary | ICD-10-CM | POA: Diagnosis not present

## 2017-01-27 DIAGNOSIS — J449 Chronic obstructive pulmonary disease, unspecified: Secondary | ICD-10-CM | POA: Diagnosis not present

## 2017-01-27 DIAGNOSIS — I110 Hypertensive heart disease with heart failure: Secondary | ICD-10-CM | POA: Diagnosis not present

## 2017-01-27 DIAGNOSIS — Z794 Long term (current) use of insulin: Secondary | ICD-10-CM | POA: Diagnosis not present

## 2017-01-27 DIAGNOSIS — Z7952 Long term (current) use of systemic steroids: Secondary | ICD-10-CM | POA: Diagnosis not present

## 2017-01-27 DIAGNOSIS — I5032 Chronic diastolic (congestive) heart failure: Secondary | ICD-10-CM | POA: Diagnosis not present

## 2017-01-27 DIAGNOSIS — Z7902 Long term (current) use of antithrombotics/antiplatelets: Secondary | ICD-10-CM | POA: Diagnosis not present

## 2017-01-27 DIAGNOSIS — Z9981 Dependence on supplemental oxygen: Secondary | ICD-10-CM | POA: Diagnosis not present

## 2017-01-27 DIAGNOSIS — E119 Type 2 diabetes mellitus without complications: Secondary | ICD-10-CM | POA: Diagnosis not present

## 2017-01-27 LAB — HM DIABETES EYE EXAM

## 2017-01-29 DIAGNOSIS — J441 Chronic obstructive pulmonary disease with (acute) exacerbation: Secondary | ICD-10-CM | POA: Diagnosis not present

## 2017-02-01 DIAGNOSIS — Z7902 Long term (current) use of antithrombotics/antiplatelets: Secondary | ICD-10-CM | POA: Diagnosis not present

## 2017-02-01 DIAGNOSIS — Z7952 Long term (current) use of systemic steroids: Secondary | ICD-10-CM | POA: Diagnosis not present

## 2017-02-01 DIAGNOSIS — J449 Chronic obstructive pulmonary disease, unspecified: Secondary | ICD-10-CM | POA: Diagnosis not present

## 2017-02-01 DIAGNOSIS — E782 Mixed hyperlipidemia: Secondary | ICD-10-CM | POA: Diagnosis not present

## 2017-02-01 DIAGNOSIS — I5032 Chronic diastolic (congestive) heart failure: Secondary | ICD-10-CM | POA: Diagnosis not present

## 2017-02-01 DIAGNOSIS — E119 Type 2 diabetes mellitus without complications: Secondary | ICD-10-CM | POA: Diagnosis not present

## 2017-02-01 DIAGNOSIS — I110 Hypertensive heart disease with heart failure: Secondary | ICD-10-CM | POA: Diagnosis not present

## 2017-02-01 DIAGNOSIS — Z9981 Dependence on supplemental oxygen: Secondary | ICD-10-CM | POA: Diagnosis not present

## 2017-02-03 DIAGNOSIS — R0602 Shortness of breath: Secondary | ICD-10-CM | POA: Diagnosis not present

## 2017-02-03 DIAGNOSIS — I5033 Acute on chronic diastolic (congestive) heart failure: Secondary | ICD-10-CM | POA: Diagnosis not present

## 2017-02-03 DIAGNOSIS — I10 Essential (primary) hypertension: Secondary | ICD-10-CM | POA: Diagnosis not present

## 2017-02-03 DIAGNOSIS — J449 Chronic obstructive pulmonary disease, unspecified: Secondary | ICD-10-CM | POA: Diagnosis not present

## 2017-02-08 DIAGNOSIS — I480 Paroxysmal atrial fibrillation: Secondary | ICD-10-CM | POA: Diagnosis not present

## 2017-02-08 DIAGNOSIS — G4733 Obstructive sleep apnea (adult) (pediatric): Secondary | ICD-10-CM | POA: Diagnosis not present

## 2017-02-08 DIAGNOSIS — I5032 Chronic diastolic (congestive) heart failure: Secondary | ICD-10-CM | POA: Diagnosis not present

## 2017-02-08 DIAGNOSIS — R0602 Shortness of breath: Secondary | ICD-10-CM | POA: Diagnosis not present

## 2017-02-08 DIAGNOSIS — I10 Essential (primary) hypertension: Secondary | ICD-10-CM | POA: Diagnosis not present

## 2017-02-09 DIAGNOSIS — Z9981 Dependence on supplemental oxygen: Secondary | ICD-10-CM | POA: Diagnosis not present

## 2017-02-09 DIAGNOSIS — Z7902 Long term (current) use of antithrombotics/antiplatelets: Secondary | ICD-10-CM | POA: Diagnosis not present

## 2017-02-09 DIAGNOSIS — I5032 Chronic diastolic (congestive) heart failure: Secondary | ICD-10-CM | POA: Diagnosis not present

## 2017-02-09 DIAGNOSIS — I110 Hypertensive heart disease with heart failure: Secondary | ICD-10-CM | POA: Diagnosis not present

## 2017-02-09 DIAGNOSIS — J449 Chronic obstructive pulmonary disease, unspecified: Secondary | ICD-10-CM | POA: Diagnosis not present

## 2017-02-09 DIAGNOSIS — E119 Type 2 diabetes mellitus without complications: Secondary | ICD-10-CM | POA: Diagnosis not present

## 2017-02-09 DIAGNOSIS — Z794 Long term (current) use of insulin: Secondary | ICD-10-CM | POA: Diagnosis not present

## 2017-02-09 DIAGNOSIS — E782 Mixed hyperlipidemia: Secondary | ICD-10-CM | POA: Diagnosis not present

## 2017-02-09 DIAGNOSIS — Z7952 Long term (current) use of systemic steroids: Secondary | ICD-10-CM | POA: Diagnosis not present

## 2017-02-15 DIAGNOSIS — Z794 Long term (current) use of insulin: Secondary | ICD-10-CM | POA: Diagnosis not present

## 2017-02-15 DIAGNOSIS — I5032 Chronic diastolic (congestive) heart failure: Secondary | ICD-10-CM | POA: Diagnosis not present

## 2017-02-15 DIAGNOSIS — E119 Type 2 diabetes mellitus without complications: Secondary | ICD-10-CM | POA: Diagnosis not present

## 2017-02-15 DIAGNOSIS — Z9981 Dependence on supplemental oxygen: Secondary | ICD-10-CM | POA: Diagnosis not present

## 2017-02-15 DIAGNOSIS — Z7952 Long term (current) use of systemic steroids: Secondary | ICD-10-CM | POA: Diagnosis not present

## 2017-02-15 DIAGNOSIS — J449 Chronic obstructive pulmonary disease, unspecified: Secondary | ICD-10-CM | POA: Diagnosis not present

## 2017-02-15 DIAGNOSIS — E782 Mixed hyperlipidemia: Secondary | ICD-10-CM | POA: Diagnosis not present

## 2017-02-15 DIAGNOSIS — Z7902 Long term (current) use of antithrombotics/antiplatelets: Secondary | ICD-10-CM | POA: Diagnosis not present

## 2017-02-15 DIAGNOSIS — I110 Hypertensive heart disease with heart failure: Secondary | ICD-10-CM | POA: Diagnosis not present

## 2017-02-20 ENCOUNTER — Other Ambulatory Visit: Payer: Self-pay | Admitting: Family Medicine

## 2017-02-20 ENCOUNTER — Ambulatory Visit: Payer: Medicare Other

## 2017-02-20 DIAGNOSIS — I480 Paroxysmal atrial fibrillation: Secondary | ICD-10-CM

## 2017-02-24 DIAGNOSIS — I110 Hypertensive heart disease with heart failure: Secondary | ICD-10-CM | POA: Diagnosis not present

## 2017-02-24 DIAGNOSIS — J449 Chronic obstructive pulmonary disease, unspecified: Secondary | ICD-10-CM | POA: Diagnosis not present

## 2017-02-24 DIAGNOSIS — Z7902 Long term (current) use of antithrombotics/antiplatelets: Secondary | ICD-10-CM | POA: Diagnosis not present

## 2017-02-24 DIAGNOSIS — E119 Type 2 diabetes mellitus without complications: Secondary | ICD-10-CM | POA: Diagnosis not present

## 2017-02-24 DIAGNOSIS — Z794 Long term (current) use of insulin: Secondary | ICD-10-CM | POA: Diagnosis not present

## 2017-02-24 DIAGNOSIS — Z7952 Long term (current) use of systemic steroids: Secondary | ICD-10-CM | POA: Diagnosis not present

## 2017-02-24 DIAGNOSIS — E782 Mixed hyperlipidemia: Secondary | ICD-10-CM | POA: Diagnosis not present

## 2017-02-24 DIAGNOSIS — I5032 Chronic diastolic (congestive) heart failure: Secondary | ICD-10-CM | POA: Diagnosis not present

## 2017-02-24 DIAGNOSIS — Z9981 Dependence on supplemental oxygen: Secondary | ICD-10-CM | POA: Diagnosis not present

## 2017-02-26 DIAGNOSIS — J441 Chronic obstructive pulmonary disease with (acute) exacerbation: Secondary | ICD-10-CM | POA: Diagnosis not present

## 2017-02-28 DIAGNOSIS — E119 Type 2 diabetes mellitus without complications: Secondary | ICD-10-CM | POA: Diagnosis not present

## 2017-02-28 DIAGNOSIS — Z7952 Long term (current) use of systemic steroids: Secondary | ICD-10-CM | POA: Diagnosis not present

## 2017-02-28 DIAGNOSIS — J449 Chronic obstructive pulmonary disease, unspecified: Secondary | ICD-10-CM | POA: Diagnosis not present

## 2017-02-28 DIAGNOSIS — Z7902 Long term (current) use of antithrombotics/antiplatelets: Secondary | ICD-10-CM | POA: Diagnosis not present

## 2017-02-28 DIAGNOSIS — I5032 Chronic diastolic (congestive) heart failure: Secondary | ICD-10-CM | POA: Diagnosis not present

## 2017-02-28 DIAGNOSIS — I110 Hypertensive heart disease with heart failure: Secondary | ICD-10-CM | POA: Diagnosis not present

## 2017-02-28 DIAGNOSIS — Z9981 Dependence on supplemental oxygen: Secondary | ICD-10-CM | POA: Diagnosis not present

## 2017-02-28 DIAGNOSIS — E782 Mixed hyperlipidemia: Secondary | ICD-10-CM | POA: Diagnosis not present

## 2017-02-28 DIAGNOSIS — Z794 Long term (current) use of insulin: Secondary | ICD-10-CM | POA: Diagnosis not present

## 2017-03-07 DIAGNOSIS — Z794 Long term (current) use of insulin: Secondary | ICD-10-CM | POA: Diagnosis not present

## 2017-03-07 DIAGNOSIS — Z7952 Long term (current) use of systemic steroids: Secondary | ICD-10-CM | POA: Diagnosis not present

## 2017-03-07 DIAGNOSIS — I5032 Chronic diastolic (congestive) heart failure: Secondary | ICD-10-CM | POA: Diagnosis not present

## 2017-03-07 DIAGNOSIS — Z9981 Dependence on supplemental oxygen: Secondary | ICD-10-CM | POA: Diagnosis not present

## 2017-03-07 DIAGNOSIS — I110 Hypertensive heart disease with heart failure: Secondary | ICD-10-CM | POA: Diagnosis not present

## 2017-03-07 DIAGNOSIS — J449 Chronic obstructive pulmonary disease, unspecified: Secondary | ICD-10-CM | POA: Diagnosis not present

## 2017-03-07 DIAGNOSIS — Z7902 Long term (current) use of antithrombotics/antiplatelets: Secondary | ICD-10-CM | POA: Diagnosis not present

## 2017-03-07 DIAGNOSIS — E119 Type 2 diabetes mellitus without complications: Secondary | ICD-10-CM | POA: Diagnosis not present

## 2017-03-07 DIAGNOSIS — E782 Mixed hyperlipidemia: Secondary | ICD-10-CM | POA: Diagnosis not present

## 2017-03-08 DIAGNOSIS — G4733 Obstructive sleep apnea (adult) (pediatric): Secondary | ICD-10-CM | POA: Diagnosis not present

## 2017-03-08 DIAGNOSIS — I10 Essential (primary) hypertension: Secondary | ICD-10-CM | POA: Diagnosis not present

## 2017-03-08 DIAGNOSIS — I5032 Chronic diastolic (congestive) heart failure: Secondary | ICD-10-CM | POA: Diagnosis not present

## 2017-03-08 DIAGNOSIS — R0602 Shortness of breath: Secondary | ICD-10-CM | POA: Diagnosis not present

## 2017-03-08 DIAGNOSIS — J439 Emphysema, unspecified: Secondary | ICD-10-CM | POA: Diagnosis not present

## 2017-03-14 DIAGNOSIS — Z794 Long term (current) use of insulin: Secondary | ICD-10-CM | POA: Diagnosis not present

## 2017-03-14 DIAGNOSIS — Z7952 Long term (current) use of systemic steroids: Secondary | ICD-10-CM | POA: Diagnosis not present

## 2017-03-14 DIAGNOSIS — I110 Hypertensive heart disease with heart failure: Secondary | ICD-10-CM | POA: Diagnosis not present

## 2017-03-14 DIAGNOSIS — J449 Chronic obstructive pulmonary disease, unspecified: Secondary | ICD-10-CM | POA: Diagnosis not present

## 2017-03-14 DIAGNOSIS — E782 Mixed hyperlipidemia: Secondary | ICD-10-CM | POA: Diagnosis not present

## 2017-03-14 DIAGNOSIS — I5032 Chronic diastolic (congestive) heart failure: Secondary | ICD-10-CM | POA: Diagnosis not present

## 2017-03-14 DIAGNOSIS — Z7902 Long term (current) use of antithrombotics/antiplatelets: Secondary | ICD-10-CM | POA: Diagnosis not present

## 2017-03-14 DIAGNOSIS — Z9981 Dependence on supplemental oxygen: Secondary | ICD-10-CM | POA: Diagnosis not present

## 2017-03-14 DIAGNOSIS — E119 Type 2 diabetes mellitus without complications: Secondary | ICD-10-CM | POA: Diagnosis not present

## 2017-03-16 ENCOUNTER — Ambulatory Visit: Payer: Medicare Other | Admitting: Family Medicine

## 2017-03-21 ENCOUNTER — Other Ambulatory Visit: Payer: Self-pay | Admitting: Family Medicine

## 2017-03-21 DIAGNOSIS — E1121 Type 2 diabetes mellitus with diabetic nephropathy: Secondary | ICD-10-CM

## 2017-03-21 DIAGNOSIS — IMO0002 Reserved for concepts with insufficient information to code with codable children: Secondary | ICD-10-CM

## 2017-03-21 DIAGNOSIS — E1165 Type 2 diabetes mellitus with hyperglycemia: Principal | ICD-10-CM

## 2017-03-23 DIAGNOSIS — Z9981 Dependence on supplemental oxygen: Secondary | ICD-10-CM | POA: Diagnosis not present

## 2017-03-23 DIAGNOSIS — Z7952 Long term (current) use of systemic steroids: Secondary | ICD-10-CM | POA: Diagnosis not present

## 2017-03-23 DIAGNOSIS — E782 Mixed hyperlipidemia: Secondary | ICD-10-CM | POA: Diagnosis not present

## 2017-03-23 DIAGNOSIS — I5032 Chronic diastolic (congestive) heart failure: Secondary | ICD-10-CM | POA: Diagnosis not present

## 2017-03-23 DIAGNOSIS — E119 Type 2 diabetes mellitus without complications: Secondary | ICD-10-CM | POA: Diagnosis not present

## 2017-03-23 DIAGNOSIS — Z7902 Long term (current) use of antithrombotics/antiplatelets: Secondary | ICD-10-CM | POA: Diagnosis not present

## 2017-03-23 DIAGNOSIS — J449 Chronic obstructive pulmonary disease, unspecified: Secondary | ICD-10-CM | POA: Diagnosis not present

## 2017-03-23 DIAGNOSIS — Z794 Long term (current) use of insulin: Secondary | ICD-10-CM | POA: Diagnosis not present

## 2017-03-23 DIAGNOSIS — I110 Hypertensive heart disease with heart failure: Secondary | ICD-10-CM | POA: Diagnosis not present

## 2017-03-28 ENCOUNTER — Inpatient Hospital Stay
Admission: EM | Admit: 2017-03-28 | Discharge: 2017-03-30 | DRG: 191 | Disposition: A | Discharge status: Home Health | Payer: Medicare Other | Attending: Internal Medicine | Admitting: Internal Medicine

## 2017-03-28 ENCOUNTER — Emergency Department: Payer: Medicare Other

## 2017-03-28 ENCOUNTER — Encounter: Payer: Self-pay | Admitting: Intensive Care

## 2017-03-28 DIAGNOSIS — R0600 Dyspnea, unspecified: Secondary | ICD-10-CM | POA: Diagnosis not present

## 2017-03-28 DIAGNOSIS — T7840XA Allergy, unspecified, initial encounter: Secondary | ICD-10-CM | POA: Diagnosis not present

## 2017-03-28 DIAGNOSIS — Z9049 Acquired absence of other specified parts of digestive tract: Secondary | ICD-10-CM | POA: Diagnosis not present

## 2017-03-28 DIAGNOSIS — Z7951 Long term (current) use of inhaled steroids: Secondary | ICD-10-CM | POA: Diagnosis not present

## 2017-03-28 DIAGNOSIS — L299 Pruritus, unspecified: Secondary | ICD-10-CM | POA: Diagnosis not present

## 2017-03-28 DIAGNOSIS — Z794 Long term (current) use of insulin: Secondary | ICD-10-CM | POA: Diagnosis not present

## 2017-03-28 DIAGNOSIS — J441 Chronic obstructive pulmonary disease with (acute) exacerbation: Principal | ICD-10-CM | POA: Diagnosis present

## 2017-03-28 DIAGNOSIS — E1121 Type 2 diabetes mellitus with diabetic nephropathy: Secondary | ICD-10-CM | POA: Diagnosis not present

## 2017-03-28 DIAGNOSIS — I110 Hypertensive heart disease with heart failure: Secondary | ICD-10-CM | POA: Diagnosis not present

## 2017-03-28 DIAGNOSIS — Z7902 Long term (current) use of antithrombotics/antiplatelets: Secondary | ICD-10-CM

## 2017-03-28 DIAGNOSIS — J9610 Chronic respiratory failure, unspecified whether with hypoxia or hypercapnia: Secondary | ICD-10-CM | POA: Diagnosis not present

## 2017-03-28 DIAGNOSIS — R0602 Shortness of breath: Secondary | ICD-10-CM | POA: Diagnosis not present

## 2017-03-28 DIAGNOSIS — E039 Hypothyroidism, unspecified: Secondary | ICD-10-CM | POA: Diagnosis not present

## 2017-03-28 DIAGNOSIS — E669 Obesity, unspecified: Secondary | ICD-10-CM | POA: Diagnosis present

## 2017-03-28 DIAGNOSIS — Z888 Allergy status to other drugs, medicaments and biological substances status: Secondary | ICD-10-CM | POA: Diagnosis not present

## 2017-03-28 DIAGNOSIS — I471 Supraventricular tachycardia: Secondary | ICD-10-CM | POA: Diagnosis present

## 2017-03-28 DIAGNOSIS — E785 Hyperlipidemia, unspecified: Secondary | ICD-10-CM | POA: Diagnosis present

## 2017-03-28 DIAGNOSIS — F419 Anxiety disorder, unspecified: Secondary | ICD-10-CM | POA: Diagnosis present

## 2017-03-28 DIAGNOSIS — I4891 Unspecified atrial fibrillation: Secondary | ICD-10-CM

## 2017-03-28 DIAGNOSIS — Z66 Do not resuscitate: Secondary | ICD-10-CM | POA: Diagnosis not present

## 2017-03-28 DIAGNOSIS — I480 Paroxysmal atrial fibrillation: Secondary | ICD-10-CM | POA: Diagnosis present

## 2017-03-28 DIAGNOSIS — G4733 Obstructive sleep apnea (adult) (pediatric): Secondary | ICD-10-CM | POA: Diagnosis present

## 2017-03-28 DIAGNOSIS — I5032 Chronic diastolic (congestive) heart failure: Secondary | ICD-10-CM | POA: Diagnosis present

## 2017-03-28 DIAGNOSIS — Z79899 Other long term (current) drug therapy: Secondary | ICD-10-CM | POA: Diagnosis not present

## 2017-03-28 DIAGNOSIS — R21 Rash and other nonspecific skin eruption: Secondary | ICD-10-CM | POA: Diagnosis not present

## 2017-03-28 DIAGNOSIS — Z8249 Family history of ischemic heart disease and other diseases of the circulatory system: Secondary | ICD-10-CM | POA: Diagnosis not present

## 2017-03-28 DIAGNOSIS — Z7952 Long term (current) use of systemic steroids: Secondary | ICD-10-CM | POA: Diagnosis not present

## 2017-03-28 DIAGNOSIS — Z9981 Dependence on supplemental oxygen: Secondary | ICD-10-CM | POA: Diagnosis not present

## 2017-03-28 DIAGNOSIS — R000 Tachycardia, unspecified: Secondary | ICD-10-CM | POA: Diagnosis not present

## 2017-03-28 DIAGNOSIS — Z6836 Body mass index (BMI) 36.0-36.9, adult: Secondary | ICD-10-CM

## 2017-03-28 DIAGNOSIS — J449 Chronic obstructive pulmonary disease, unspecified: Secondary | ICD-10-CM | POA: Diagnosis present

## 2017-03-28 LAB — CBC WITH DIFFERENTIAL/PLATELET
BASOS ABS: 0.1 10*3/uL (ref 0–0.1)
BASOS PCT: 1 %
EOS ABS: 0.1 10*3/uL (ref 0–0.7)
EOS PCT: 2 %
HCT: 40.9 % (ref 35.0–47.0)
Hemoglobin: 13 g/dL (ref 12.0–16.0)
LYMPHS PCT: 18 %
Lymphs Abs: 1.8 10*3/uL (ref 1.0–3.6)
MCH: 27.5 pg (ref 26.0–34.0)
MCHC: 31.9 g/dL — ABNORMAL LOW (ref 32.0–36.0)
MCV: 86.3 fL (ref 80.0–100.0)
Monocytes Absolute: 0.6 10*3/uL (ref 0.2–0.9)
Monocytes Relative: 6 %
Neutro Abs: 7.5 10*3/uL — ABNORMAL HIGH (ref 1.4–6.5)
Neutrophils Relative %: 73 %
PLATELETS: 205 10*3/uL (ref 150–440)
RBC: 4.74 MIL/uL (ref 3.80–5.20)
RDW: 14.8 % — ABNORMAL HIGH (ref 11.5–14.5)
WBC: 10.1 10*3/uL (ref 3.6–11.0)

## 2017-03-28 LAB — GLUCOSE, CAPILLARY
GLUCOSE-CAPILLARY: 272 mg/dL — AB (ref 65–99)
Glucose-Capillary: 189 mg/dL — ABNORMAL HIGH (ref 65–99)
Glucose-Capillary: 208 mg/dL — ABNORMAL HIGH (ref 65–99)

## 2017-03-28 LAB — BASIC METABOLIC PANEL
Anion gap: 5 (ref 5–15)
BUN: 20 mg/dL (ref 6–20)
CHLORIDE: 103 mmol/L (ref 101–111)
CO2: 30 mmol/L (ref 22–32)
CREATININE: 0.9 mg/dL (ref 0.44–1.00)
Calcium: 8.5 mg/dL — ABNORMAL LOW (ref 8.9–10.3)
GFR calc non Af Amer: >60 mL/min (ref >60)
Glucose, Bld: 302 mg/dL — ABNORMAL HIGH (ref 65–99)
Potassium: 4 mmol/L (ref 3.5–5.1)
SODIUM: 138 mmol/L (ref 135–145)

## 2017-03-28 LAB — TROPONIN I

## 2017-03-28 MED ORDER — PANTOPRAZOLE SODIUM 40 MG PO TBEC
40.0000 mg | DELAYED_RELEASE_TABLET | Freq: Every day | ORAL | Status: DC
Start: 2017-03-29 — End: 2017-03-30
  Administered 2017-03-29 – 2017-03-30 (×2): 40 mg via ORAL
  Filled 2017-03-28 (×2): qty 1

## 2017-03-28 MED ORDER — CEPHALEXIN 500 MG PO CAPS
500.0000 mg | ORAL_CAPSULE | Freq: Three times a day (TID) | ORAL | Status: DC
  Administered 2017-03-28 – 2017-03-30 (×7): 500 mg via ORAL
  Filled 2017-03-28 (×8): qty 1

## 2017-03-28 MED ORDER — DIGOXIN 125 MCG PO TABS
0.1250 mg | ORAL_TABLET | ORAL | Status: DC
  Administered 2017-03-29: 0.125 mg via ORAL
  Filled 2017-03-28: qty 1

## 2017-03-28 MED ORDER — HYDROCODONE-ACETAMINOPHEN 5-325 MG PO TABS: 1.0000 | ORAL_TABLET | ORAL | Status: DC | PRN

## 2017-03-28 MED ORDER — PRAVASTATIN SODIUM 40 MG PO TABS
40.0000 mg | ORAL_TABLET | Freq: Every day | ORAL | Status: DC
  Administered 2017-03-28 – 2017-03-29 (×2): 40 mg via ORAL
  Filled 2017-03-28 (×3): qty 1

## 2017-03-28 MED ORDER — ONDANSETRON HCL 4 MG PO TABS: 4.0000 mg | ORAL_TABLET | Freq: Four times a day (QID) | ORAL | Status: DC | PRN

## 2017-03-28 MED ORDER — ORAL CARE MOUTH RINSE: 15.0000 mL | Freq: Two times a day (BID) | OROMUCOSAL | Status: DC

## 2017-03-28 MED ORDER — ORAL CARE MOUTH RINSE
15.0000 mL | Freq: Two times a day (BID) | OROMUCOSAL | Status: DC
  Administered 2017-03-28 – 2017-03-30 (×3): 15 mL via OROMUCOSAL

## 2017-03-28 MED ORDER — INSULIN ASPART PROT & ASPART (70-30 MIX) 100 UNIT/ML ~~LOC~~ SUSP
30.0000 [IU] | Freq: Two times a day (BID) | SUBCUTANEOUS | Status: DC
  Administered 2017-03-28 – 2017-03-30 (×5): 30 [IU] via SUBCUTANEOUS
  Filled 2017-03-28 (×5): qty 30

## 2017-03-28 MED ORDER — AMLODIPINE BESYLATE 10 MG PO TABS
10.0000 mg | ORAL_TABLET | Freq: Every day | ORAL | Status: DC
  Administered 2017-03-29: 10 mg via ORAL
  Filled 2017-03-28 (×2): qty 1

## 2017-03-28 MED ORDER — INSULIN ASPART 100 UNIT/ML ~~LOC~~ SOLN
0.0000 [IU] | Freq: Three times a day (TID) | SUBCUTANEOUS | Status: DC
  Administered 2017-03-28: 7 [IU] via SUBCUTANEOUS
  Administered 2017-03-29: 11 [IU] via SUBCUTANEOUS
  Administered 2017-03-29: 15 [IU] via SUBCUTANEOUS
  Administered 2017-03-29 – 2017-03-30 (×2): 4 [IU] via SUBCUTANEOUS
  Administered 2017-03-30: 7 [IU] via SUBCUTANEOUS
  Administered 2017-03-30: 4 [IU] via SUBCUTANEOUS
  Filled 2017-03-28 (×2): qty 4
  Filled 2017-03-28: qty 7
  Filled 2017-03-28: qty 4
  Filled 2017-03-28: qty 11
  Filled 2017-03-28: qty 15
  Filled 2017-03-28: qty 7

## 2017-03-28 MED ORDER — CLOPIDOGREL BISULFATE 75 MG PO TABS
75.0000 mg | ORAL_TABLET | Freq: Every day | ORAL | Status: DC
  Administered 2017-03-28 – 2017-03-29 (×2): 75 mg via ORAL
  Filled 2017-03-28 (×3): qty 1

## 2017-03-28 MED ORDER — METHYLPREDNISOLONE SODIUM SUCC 40 MG IJ SOLR
40.0000 mg | Freq: Every day | INTRAMUSCULAR | Status: DC
  Administered 2017-03-29: 40 mg via INTRAVENOUS
  Filled 2017-03-28: qty 1

## 2017-03-28 MED ORDER — BISACODYL 5 MG PO TBEC: 5.0000 mg | DELAYED_RELEASE_TABLET | Freq: Every day | ORAL | Status: DC | PRN

## 2017-03-28 MED ORDER — RALOXIFENE HCL 60 MG PO TABS
60.0000 mg | ORAL_TABLET | Freq: Every day | ORAL | Status: DC
  Administered 2017-03-28 – 2017-03-30 (×3): 60 mg via ORAL
  Filled 2017-03-28 (×3): qty 1

## 2017-03-28 MED ORDER — IPRATROPIUM-ALBUTEROL 0.5-2.5 (3) MG/3ML IN SOLN
3.0000 mL | RESPIRATORY_TRACT | Status: DC
  Administered 2017-03-28 – 2017-03-30 (×11): 3 mL via RESPIRATORY_TRACT
  Filled 2017-03-28 (×12): qty 3

## 2017-03-28 MED ORDER — ACETAMINOPHEN 325 MG PO TABS: 650.0000 mg | ORAL_TABLET | Freq: Four times a day (QID) | ORAL | Status: DC | PRN

## 2017-03-28 MED ORDER — LEVOTHYROXINE SODIUM 75 MCG PO TABS
150.0000 ug | ORAL_TABLET | Freq: Every day | ORAL | Status: DC
  Administered 2017-03-28 – 2017-03-30 (×3): 150 ug via ORAL
  Filled 2017-03-28 (×3): qty 2

## 2017-03-28 MED ORDER — IPRATROPIUM-ALBUTEROL 0.5-2.5 (3) MG/3ML IN SOLN
3.0000 mL | Freq: Once | RESPIRATORY_TRACT | Status: AC
  Administered 2017-03-28: 3 mL via RESPIRATORY_TRACT
  Filled 2017-03-28: qty 3

## 2017-03-28 MED ORDER — PNEUMOCOCCAL VAC POLYVALENT 25 MCG/0.5ML IJ INJ: 0.5000 mL | INJECTION | INTRAMUSCULAR | Status: DC

## 2017-03-28 MED ORDER — FUROSEMIDE 40 MG PO TABS
40.0000 mg | ORAL_TABLET | Freq: Every day | ORAL | Status: DC
  Administered 2017-03-28 – 2017-03-30 (×3): 40 mg via ORAL
  Filled 2017-03-28 (×3): qty 1

## 2017-03-28 MED ORDER — METHYLPREDNISOLONE SODIUM SUCC 125 MG IJ SOLR
125.0000 mg | Freq: Once | INTRAMUSCULAR | Status: AC
  Administered 2017-03-28: 125 mg via INTRAVENOUS
  Filled 2017-03-28: qty 2

## 2017-03-28 MED ORDER — ONDANSETRON HCL 4 MG/2ML IJ SOLN: 4.0000 mg | Freq: Four times a day (QID) | INTRAMUSCULAR | Status: DC | PRN

## 2017-03-28 MED ORDER — SENNOSIDES-DOCUSATE SODIUM 8.6-50 MG PO TABS: 1.0000 | ORAL_TABLET | Freq: Every evening | ORAL | Status: DC | PRN

## 2017-03-28 MED ORDER — AMLODIPINE BESYLATE 10 MG PO TABS: 10.0000 mg | ORAL_TABLET | Freq: Every day | ORAL | Status: DC

## 2017-03-28 MED ORDER — DIPHENHYDRAMINE HCL 25 MG PO CAPS
50.0000 mg | ORAL_CAPSULE | Freq: Four times a day (QID) | ORAL | Status: DC | PRN
  Administered 2017-03-28 – 2017-03-30 (×6): 50 mg via ORAL
  Filled 2017-03-28 (×7): qty 2

## 2017-03-28 MED ORDER — LOSARTAN POTASSIUM 50 MG PO TABS
100.0000 mg | ORAL_TABLET | Freq: Every day | ORAL | Status: DC
  Administered 2017-03-29 – 2017-03-30 (×2): 100 mg via ORAL
  Filled 2017-03-28 (×2): qty 2

## 2017-03-28 MED ORDER — ACETAMINOPHEN 650 MG RE SUPP: 650.0000 mg | Freq: Four times a day (QID) | RECTAL | Status: DC | PRN

## 2017-03-28 MED ORDER — FERROUS SULFATE 325 (65 FE) MG PO TABS
325.0000 mg | ORAL_TABLET | ORAL | Status: DC
Start: 2017-03-29 — End: 2017-03-30
  Administered 2017-03-29: 325 mg via ORAL
  Filled 2017-03-28: qty 1

## 2017-03-28 MED ORDER — VITAMIN E 180 MG (400 UNIT) PO CAPS
400.0000 [IU] | ORAL_CAPSULE | Freq: Every day | ORAL | Status: DC
  Administered 2017-03-28 – 2017-03-30 (×3): 400 [IU] via ORAL
  Filled 2017-03-28 (×3): qty 1

## 2017-03-28 MED ORDER — ENOXAPARIN SODIUM 40 MG/0.4ML ~~LOC~~ SOLN
40.0000 mg | SUBCUTANEOUS | Status: DC
  Administered 2017-03-28 – 2017-03-29 (×2): 40 mg via SUBCUTANEOUS
  Filled 2017-03-28 (×2): qty 0.4

## 2017-03-28 NOTE — H&P (Signed)
[facility] - [place] at [facility]   PATIENT NAME: Karen [name]    MR#:  [id number]  DATE OF BIRTH:  12-16-1940  DATE OF ADMISSION:  03/28/2017  PRIMARY CARE PHYSICIAN: Brayton El, MD   REQUESTING/REFERRING PHYSICIAN: Dr Shaune Pollack  CHIEF COMPLAINT:   SOb and rash HISTORY OF PRESENT ILLNESS:  Karen [name]  is a 76 y.o. female with a known history of COPD with chronic respiratory failure on 2-3 L of oxygen and diabetes who presents with above complaint. Patient reports since Friday she developed a pruritic rash on her chest, trunk and arms. She has not had any new antibiotics or any new change in her regimen. She has been taking Benadryl for the itching sensation. She has not developed hives. She has a history of allergies and has been to an allergist in the past. She also complains of increasing shortness of breath, wheezing and dyspnea exertion. She denies PND, orthopnea or chest pain.. She has developed increasing lower extremity edema and her Lasix has been increased from 40 mg daily to twice a day.  In the emergency room she has been treated for her rash/allergic reaction with Benadryl and steroids as well as for COPD exacerbation. She continues to have dyspnea exertion and was found to have tachycardia and tachypnea and therefore will require admission.  PAST MEDICAL HISTORY:   Past Medical History:  Diagnosis Date  . COPD (chronic obstructive pulmonary disease) (HCC)   . Diabetes mellitus without complication (HCC)   . Hyperlipidemia   . Hypertension    Diastolic heart failure PAF PSVT  PAST SURGICAL HISTORY:   Past Surgical History:  Procedure Laterality Date  . CHOLECYSTECTOMY    . NO PAST SURGERIES      SOCIAL HISTORY:   Social History  Substance Use Topics  . Smoking status: Never Smoker  . Smokeless tobacco: Never Used  . Alcohol use No    FAMILY HISTORY:   Family History  Problem Relation Age of Onset  . Heart disease Mother   . Heart  disease Father     DRUG ALLERGIES:   Allergies  Allergen Reactions  . Ace Inhibitors Other (See Comments)    REVIEW OF SYSTEMS:   Review of Systems  Constitutional: Positive for malaise/fatigue. Negative for chills and fever.  HENT: Negative.  Negative for ear discharge, ear pain, hearing loss, nosebleeds and sore throat.   Eyes: Negative.  Negative for blurred vision and pain.  Respiratory: Positive for cough, shortness of breath and wheezing. Negative for hemoptysis.   Cardiovascular: Positive for leg swelling. Negative for chest pain and palpitations.  Gastrointestinal: Negative.  Negative for abdominal pain, blood in stool, diarrhea, nausea and vomiting.  Genitourinary: Negative.  Negative for dysuria.  Musculoskeletal: Negative.  Negative for back pain.  Skin: Positive for itching and rash.  Neurological: Positive for weakness. Negative for dizziness, tremors, speech change, focal weakness, seizures and headaches.  Endo/Heme/Allergies: Negative.  Does not bruise/bleed easily.  Psychiatric/Behavioral: Negative.  Negative for depression, hallucinations and suicidal ideas.    MEDICATIONS AT HOME:   Prior to Admission medications   Medication Sig Start Date End Date Taking? Authorizing Provider  amLODipine (NORVASC) 10 MG tablet Take 1 tablet (10 mg total) by mouth daily. 12/19/16  Yes Ellyn Hack, MD  clopidogrel (PLAVIX) 75 MG tablet Take 1 tablet (75 mg total) by mouth daily. 08/01/16  Yes Ellyn Hack, MD  digoxin (LANOXIN) 0.125 MG tablet Take 0.125 mg by mouth every  other day.    Yes Historical Provider, MD  ferrous sulfate 325 (65 FE) MG EC tablet Take 325 mg by mouth every other day.    Yes Historical Provider, MD  furosemide (LASIX) 40 MG tablet Take 40 mg by mouth daily.   Yes Historical Provider, MD  HUMULIN 70/30 (70-30) 100 UNIT/ML injection INJECT 50 UNITS SUBCUTANEOUSLY ONCE IN THE MORNING AND 27 UNITS EVERYEVENING 03/21/17  Yes Ellyn Hack, MD  insulin  lispro (HUMALOG KWIKPEN) 100 UNIT/ML KiwkPen Use as directed according to Sliding Scale. Patient taking differently: Use as directed according to Sliding Scale. 12/19/16  Yes Ellyn Hack, MD  ipratropium-albuterol (DUONEB) 0.5-2.5 (3) MG/3ML SOLN USE 1 VIAL VIA NEBULIZER EVERY 6 HOURS AS NEEDED 03/21/17  Yes Ellyn Hack, MD  levothyroxine (SYNTHROID, LEVOTHROID) 150 MCG tablet TAKE 1 TABLET BY MOUTH ONCE DAILY ON AN EMPTY STOMACH. WAIT 30 MINUTES BEFORE TAKING OTHER MEDS. 03/21/17  Yes Ellyn Hack, MD  losartan (COZAAR) 100 MG tablet Take 1 tablet (100 mg total) by mouth daily. 12/19/16  Yes Ellyn Hack, MD  pantoprazole (PROTONIX) 40 MG tablet Take 1 tablet (40 mg total) by mouth daily. 01/17/17  Yes Ellyn Hack, MD  pravastatin (PRAVACHOL) 40 MG tablet Take 1 tablet (40 mg total) by mouth daily. 12/19/16  Yes Ellyn Hack, MD  predniSONE (STERAPRED UNI-PAK 21 TAB) 10 MG (21) TBPK tablet Take 1 tablet (10 mg total) by mouth daily. 60 50 40 then STOP Patient taking differently: Take 10 mg by mouth daily.  12/19/16  Yes Ellyn Hack, MD  raloxifene (EVISTA) 60 MG tablet Take 1 tablet (60 mg total) by mouth daily. 08/01/16  Yes Ellyn Hack, MD  VENTOLIN HFA 108 (90 BASE) MCG/ACT inhaler  05/08/15  Yes Historical Provider, MD  vitamin E 400 UNIT capsule Take 400 Units by mouth daily.   Yes Historical Provider, MD  B-D INS SYRINGE 0.5CC/31GX5/16 31G X 5/16" 0.5 ML MISC USE TO INJECT INSULIN TWICE DAILY. 12/19/16   Ellyn Hack, MD  doxycycline (VIBRA-TABS) 100 MG tablet Take 1 tablet (100 mg total) by mouth 2 (two) times daily. Patient not taking: Reported on 03/28/2017 12/19/16   Ellyn Hack, MD  Insulin Pen Needle (UNIFINE PENTIPS) 31G X 6 MM MISC Use as directed to inject Insulin. 08/01/16   Ellyn Hack, MD  PULMICORT [facility] 180 MCG/ACT inhaler  06/06/16   Historical Provider, MD      VITAL SIGNS:  Blood pressure (!) 126/50, pulse (!) 106,  temperature 97.7 F (36.5 C), temperature source Oral, resp. rate (!) 21, height  (1.499 m), weight 83 kg (183 lb), SpO2 97 %.  PHYSICAL EXAMINATION:   Physical Exam  Constitutional: She is oriented to person, place, and time and well-developed, well-nourished, and in no distress. No distress.  HENT:  Head: Normocephalic.  Eyes: No scleral icterus.  Neck: Normal range of motion. Neck supple. No JVD present. No tracheal deviation present.  Cardiovascular: Normal rate and regular rhythm.  Exam reveals no gallop and no friction rub.   No murmur heard. tachycardic  Pulmonary/Chest: Effort normal. No respiratory distress. She has wheezes. She has no rales. She exhibits no tenderness.  Abdominal: Soft. Bowel sounds are normal. She exhibits no distension and no mass. There is no tenderness. There is no rebound and no guarding.  Musculoskeletal: Normal range of motion. She  exhibits no edema.  Neurological: She is alert and oriented to person, place, and time.  Skin: Skin is warm. Rash noted. No erythema.  Left lower extremity erythema, warmth and tenderness  Psychiatric: Affect and judgment normal.      LABORATORY PANEL:   CBC  Recent Labs Lab 03/28/17 0946  WBC 10.1  HGB 13.0  HCT 40.9  PLT 205   ------------------------------------------------------------------------------------------------------------------  Chemistries   Recent Labs Lab 03/28/17 0946  NA 138  K 4.0  CL 103  CO2 30  GLUCOSE 302*  BUN 20  CREATININE 0.90  CALCIUM 8.5*   ------------------------------------------------------------------------------------------------------------------  Cardiac Enzymes  Recent Labs Lab 03/28/17 0946  TROPONINI <0.03   ------------------------------------------------------------------------------------------------------------------  RADIOLOGY:  Dg Chest Port 1 View  Result Date: 03/28/2017 CLINICAL DATA:  Shortness of breath. EXAM: PORTABLE CHEST 1 VIEW  COMPARISON:  01/25/2017. FINDINGS: Mediastinum hilar structures normal. Cardiomegaly with normal pulmonary vascularity. No overt congestive heart failure. Low lung volumes. Mild left base pleural-parenchymal thickening consistent with scarring again noted. No pneumothorax. No acute bony abnormality. Degenerative changes thoracic spine with scoliosis concave left. Degenerative changes both shoulders. IMPRESSION: 1. Cardiomegaly.  No evidence of overt congestive heart failure. 2. Mild left base pleuroparenchymal thickening consistent with scarring again noted. Electronically Signed   By: Maisie Fus  Register   On: 03/28/2017 10:05    EKG:     IMPRESSION AND PLAN:   76 year old female with chronic respiratory failure on 2-3 L of oxygen due to COPD who presents with rash and COPD exacerbation.  1. Acute exacerbation of COPD with chronic respiratory failure: Continue IV steroids, DuoNeb's.  2. Allergic reaction with skin rash: Continue Benadryl and steroids. Patient does not want to see an allergist after discharge  3. Diabetes: Diabetes coordinator consultation. ADA diet with sliding scale insulin For now start low-dose Lantus and follow blood sugars  4. hypothyroidism: Continue Synthroid n  5. Essential hypertension: Continue losartan and Norvasc  6. Hyperlipidemia: Continue statin 7. Chronic diastolic heart failure: Does not appear to be in exacerbation at this time  8. Proximal atrial fibrillation  9. History of OSA does not use CPAP All the records are reviewed and case discussed with ED provider. Management plans discussed with the patient and she in agreement  CODE STATUS:DNR TOTAL TIME TAKING CARE OF THIS PATIENT: 40 minutes.    Jandel Patriarca M.D on 03/28/2017 at 1:34 PM  Between 7am to 6pm - Pager - [phone]  After 6pm go to [email or web address] - Social research officer, government  [facility]  Office  [phone]  CC: Primary care physician; Brayton El, MD

## 2017-03-28 NOTE — ED Provider Notes (Addendum)
[facility] Emergency Department Provider Note ____________________________________________   I have reviewed the triage vital signs and the triage nursing note.  HISTORY  Chief Complaint Shortness of Breath and Allergic Reaction   Historian Patient  HPI Karen [name] is a 76 y.o. female with COPD, home 2-3 L [place], presents with worsening dyspnea and wheezing for 2 days. She was outside over the weekend, and developed an itchy red rash on her chest and under her arms after working outside on Sunday, on Monday. She also continued to have worsening trouble breathing. She's also been having increased lower extremity edema. For the past one month she has been increased from 40 mg Lasix daily to 40 mg twice daily.  No chest pain. She called EMS for trouble catching her breath this morning.Took one DuoNeb before EMS arrived. She was given an additional albuterol treatment on the way in. Slight improvement.   Past Medical History:  Diagnosis Date  . COPD (chronic obstructive pulmonary disease) (HCC)   . Diabetes mellitus without complication (HCC)   . Hyperlipidemia   . Hypertension     Patient Active Problem List   Diagnosis Date Noted  . Swelling of limb 09/27/2016  . Lower limb ulcer, calf, left, limited to breakdown of skin (HCC) 09/27/2016  . Encounter related to need for assistance with personal care 04/13/2016  . Personal history of other diseases of the circulatory system 11/09/2015  . Easy bruising 10/20/2015  . COPD exacerbation (HCC) 10/19/2015  . Atrial fibrillation (HCC) 10/19/2015  . Diabetes mellitus (HCC) 10/19/2015  . 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 Proteinuria due to type 2 diabetes mellitus (HCC) 06/26/2015  . Morbid obesity due to excess calories (HCC) 06/26/2015  . Obesity, Class II, BMI 35-39.9 06/26/2015  . On continuous oral anticoagulation 06/26/2015  . Asthma with COPD (HCC) 06/26/2015  .  Hyperlipidemia 06/26/2015  . Obstructive sleep apnea 06/26/2015  . CAFL (chronic airflow limitation) (HCC) 06/01/2015  . A-fib (HCC) 06/01/2015  . Diabetes (HCC) 06/01/2015  . History of prolonged Q-T interval on ECG 06/01/2015  . HLD (hyperlipidemia) 06/01/2015  . BP (high blood pressure) 06/01/2015  . Obstructive apnea 06/01/2015    Past Surgical History:  Procedure Laterality Date  . CHOLECYSTECTOMY    . NO PAST SURGERIES      Prior to Admission medications   Medication Sig Start Date End Date Taking? Authorizing Provider  amLODipine (NORVASC) 10 MG tablet Take 1 tablet (10 mg total) by mouth daily. 12/19/16  Yes Ellyn Hack, MD  clopidogrel (PLAVIX) 75 MG tablet Take 1 tablet (75 mg total) by mouth daily. 08/01/16  Yes Ellyn Hack, MD  digoxin (LANOXIN) 0.125 MG tablet Take 0.125 mg by mouth every other day.    Yes Historical Provider, MD  ferrous sulfate 325 (65 FE) MG EC tablet Take 325 mg by mouth every other day.    Yes Historical Provider, MD  furosemide (LASIX) 40 MG tablet Take 40 mg by mouth daily.   Yes Historical Provider, MD  HUMULIN 70/30 (70-30) 100 UNIT/ML injection INJECT 50 UNITS SUBCUTANEOUSLY ONCE IN THE MORNING AND 27 UNITS EVERYEVENING 03/21/17  Yes Ellyn Hack, MD  insulin lispro (HUMALOG KWIKPEN) 100 UNIT/ML KiwkPen Use as directed according to Sliding Scale. Patient taking differently: Use as directed according to Sliding Scale. 12/19/16  Yes Ellyn Hack, MD  ipratropium-albuterol (DUONEB) 0.5-2.5 (3) MG/3ML SOLN USE 1 VIAL VIA NEBULIZER EVERY  6 HOURS AS NEEDED 03/21/17  Yes Ellyn Hack, MD  levothyroxine (SYNTHROID, LEVOTHROID) 150 MCG tablet TAKE 1 TABLET BY MOUTH ONCE DAILY ON AN EMPTY STOMACH. WAIT 30 MINUTES BEFORE TAKING OTHER MEDS. 03/21/17  Yes Ellyn Hack, MD  losartan (COZAAR) 100 MG tablet Take 1 tablet (100 mg total) by mouth daily. 12/19/16  Yes Ellyn Hack, MD  pantoprazole (PROTONIX) 40 MG tablet Take 1 tablet (40 mg  total) by mouth daily. 01/17/17  Yes Ellyn Hack, MD  pravastatin (PRAVACHOL) 40 MG tablet Take 1 tablet (40 mg total) by mouth daily. 12/19/16  Yes Ellyn Hack, MD  predniSONE (STERAPRED UNI-PAK 21 TAB) 10 MG (21) TBPK tablet Take 1 tablet (10 mg total) by mouth daily. 60 50 40 then STOP Patient taking differently: Take 10 mg by mouth daily.  12/19/16  Yes Ellyn Hack, MD  raloxifene (EVISTA) 60 MG tablet Take 1 tablet (60 mg total) by mouth daily. 08/01/16  Yes Ellyn Hack, MD  VENTOLIN HFA 108 (90 BASE) MCG/ACT inhaler  05/08/15  Yes Historical Provider, MD  vitamin E 400 UNIT capsule Take 400 Units by mouth daily.   Yes Historical Provider, MD  B-D INS SYRINGE 0.5CC/31GX5/16 31G X 5/16" 0.5 ML MISC USE TO INJECT INSULIN TWICE DAILY. 12/19/16   Ellyn Hack, MD  doxycycline (VIBRA-TABS) 100 MG tablet Take 1 tablet (100 mg total) by mouth 2 (two) times daily. Patient not taking: Reported on 03/28/2017 12/19/16   Ellyn Hack, MD  Insulin Pen Needle (UNIFINE PENTIPS) 31G X 6 MM MISC Use as directed to inject Insulin. 08/01/16   Ellyn Hack, MD  PULMICORT [facility] 180 MCG/ACT inhaler  06/06/16   Historical Provider, MD    Allergies  Allergen Reactions  . Ace Inhibitors Other (See Comments)    Family History  Problem Relation Age of Onset  . Heart disease Mother   . Heart disease Father     Social History Social History  Substance Use Topics  . Smoking status: Never Smoker  . Smokeless tobacco: Never Used  . Alcohol use No    Review of Systems  Constitutional: Negative for fever. Eyes: Negative for visual changes. ENT: Negative for sore throat. Cardiovascular: Negative for chest pain. Respiratory: Positive for shortness of breath. Gastrointestinal: Negative for abdominal pain, vomiting and diarrhea. Genitourinary: Negative for dysuria. Musculoskeletal: Negative for back pain. Skin: Negative for rash. Neurological: Negative for headache. 10  point Review of Systems otherwise negative ____________________________________________   PHYSICAL EXAM:  VITAL SIGNS: ED Triage Vitals  Enc Vitals Group     BP      Pulse      Resp      Temp      Temp src      SpO2      Weight      Height      Head Circumference      Peak Flow      Pain Score      Pain Loc      Pain Edu?      Excl. in GC?      Constitutional: Alert and oriented. Well appearing and in no distress. HEENT   Head: Normocephalic and atraumatic.      Eyes: Conjunctivae are normal. PERRL. Normal extraocular movements.      Ears:         Nose: No congestion/rhinnorhea.  Mouth/Throat: Mucous membranes are moist.   Neck: No stridor. Cardiovascular/Chest: Normal rate, regular rhythm.  No murmurs, rubs, or gallops. Respiratory: Moderate wheezing and decreased air movement throughout.  No retractions. Gastrointestinal: Soft. No distention, no guarding, no rebound. Nontender.  Obese  Genitourinary/rectal:Deferred Musculoskeletal: Nontender with normal range of motion in all extremities. No joint effusions.  No lower extremity tenderness.  2+ LE edema bilaterally. Neurologic:  Normal speech and language. No gross or focal neurologic deficits are appreciated. Skin:  Skin is warm, dry and intact. Mild diffuse erythematous rash across upper extremities, chest, breasts, and back. No cellulitic or purpuric appearance Psychiatric: Mood and affect are normal. Speech and behavior are normal. Patient exhibits appropriate insight and judgment.   ____________________________________________  LABS (pertinent positives/negatives)  Labs Reviewed  BASIC METABOLIC PANEL - Abnormal; Notable for the following:       Result Value   Glucose, Bld 302 (*)    Calcium 8.5 (*)    All other components within normal limits  CBC WITH DIFFERENTIAL/PLATELET - Abnormal; Notable for the following:    MCHC 31.9 (*)    RDW 14.8 (*)    Neutro Abs 7.5 (*)    All other components  within normal limits  TROPONIN I    ____________________________________________    EKG I, Governor Rooks, MD, the attending physician have personally viewed and interpreted all ECGs.  107 bpm. Irregularly irregular consistent with atrial fibrillation. Narrow QRS. Normal axis. Nonspecific ST and T-wave, with T waves inverted inferiorly and laterally. This is similar to prior EKG. ____________________________________________  RADIOLOGY All Xrays were viewed by me. Imaging interpreted by Radiologist.  CXR:  IMPRESSION: 1. Cardiomegaly. No evidence of overt congestive heart failure.  2. Mild left base pleuroparenchymal thickening consistent with scarring again noted. __________________________________________  PROCEDURES  Procedure(s) performed: None  Critical Care performed: None  ____________________________________________   ED COURSE / ASSESSMENT AND PLAN  Pertinent labs & imaging results that were available during my care of the patient were reviewed by me and considered in my medical decision making (see chart for details).   Ms. Gotto is here with significant wheezing, likely COPD exacerbation, but also consider CHF.  No hypoxia to her home baseline O2 requirement which is 2-3 L at home.  Granddaughter provided some additional history. She gets around at home either with a walker or without a walker.  She is much improved from the wheezing after DuoNeb treatments here and she is also given Solu-Medrol. I discussed with her doing a longer taper, however she would prefer to just do a burst at higher dose of prednisone, so she is going to 4 days of 40 mg before returning to her baseline 10 mg daily.  In terms of the diffuse skin rash, it does look likely allergic, unknown cause. I don't think that her wheezing had to do with an anaphylactic reaction.  We discussed to continue over-the-counter Benadryl, and suddenly she'll be on a higher dose prednisone which may help  as well.  She is improved clinically from the standpoint of her breathing, and would like to go home. Walking O2 sat was attempted and she did not tolerate. HR went up to 130s, dyspneic with tachypnea to 35.  Discussed and recommended hospital observation management.  I spoke with the patient's daughter in law and son, the healthcare power of attorney who are in agreement.  CONSULTATIONS:   Dr. Juliene Pina, hospitalist for admission.  Patient / Family / Caregiver informed of clinical course, medical decision-making process,  and agree with plan.   ___________________________________________   FINAL CLINICAL IMPRESSION(S) / ED DIAGNOSES   Final diagnoses:  Allergic reaction, initial encounter  COPD exacerbation (HCC)  Atrial fibrillation with rapid ventricular response [facility])              Note: This dictation was prepared with Dragon dictation. Any transcriptional errors that result from this process are unintentional    Governor Rooks, MD 03/28/17 1255    Governor Rooks, MD 03/28/17 1258

## 2017-03-28 NOTE — Discharge Instructions (Signed)
You were evaluated for wheezing and trouble breathing, and are being treated for COPD exacerbation. You are instructed did take 40 mg daily of prednisone for 4 more days and then return to your normal dosing of 10 mg daily. You may continue to take Benadryl as directed on label for skin rash.  Although no certain cause was found for the skin rash, your exam and evaluation are reassuring in the emergency department today.  Return to emergency room immediately for any fever, worsening skin rash, drainage, trouble breathing, chest pain, or any other symptoms concerning to you.

## 2017-03-28 NOTE — ED Notes (Signed)
Per Dr. Merlene Pulling request this nurse ambulated patient down hallway while monitoring patient's vital signs.  Patient maintained oxygen sat of 98% on chronic 2L [place], respiratory rate 38, heart rate 130.  Patient in no obvious distress or stated distress during ambulation.  Dr. Shaune Pollack notified.

## 2017-03-28 NOTE — ED Notes (Signed)
Admitting MD at bedside.

## 2017-03-28 NOTE — ED Triage Notes (Signed)
Patient arrived by EMS from home for c/o shortness of breath with HX COPD. Audible wheezes heard upon arrival. A&O x4. Pt notices hives on Monday morning and has been taking benadryl at home. Pt is unsure what she is getting hives from. Cellulitis on L lower calf and pt reports a home health nurse comes to her house for spot. Duoneb at home with no relief. HX CHF, diabetes. EMS vitals CBG 354, 164/84 blood pressure, HR 103

## 2017-03-29 LAB — GLUCOSE, CAPILLARY
GLUCOSE-CAPILLARY: 272 mg/dL — AB (ref 65–99)
GLUCOSE-CAPILLARY: 346 mg/dL — AB (ref 65–99)
GLUCOSE-CAPILLARY: 179 mg/dL — AB (ref 65–99)
Glucose-Capillary: 170 mg/dL — ABNORMAL HIGH (ref 65–99)

## 2017-03-29 MED ORDER — LIDOCAINE 5 % EX OINT
TOPICAL_OINTMENT | Freq: Two times a day (BID) | CUTANEOUS | Status: DC | PRN
  Administered 2017-03-29 – 2017-03-30 (×2): via TOPICAL
  Filled 2017-03-29 (×2): qty 35.44

## 2017-03-29 MED ORDER — ALPRAZOLAM 0.25 MG PO TABS
0.2500 mg | ORAL_TABLET | Freq: Three times a day (TID) | ORAL | Status: DC | PRN
  Administered 2017-03-29 (×2): 0.25 mg via ORAL
  Filled 2017-03-29 (×2): qty 1

## 2017-03-29 NOTE — Progress Notes (Signed)
[facility] [place] [facility]   PATIENT NAME: Karen [name]    MR#:  [id number]  DATE OF BIRTH:  [birth year]  SUBJECTIVE:  CHIEF COMPLAINT:   Chief Complaint  Patient presents with  . Shortness of Breath  . Allergic Reaction     Came with allergic rash and SOB, felt better last night- but again broke out with severe rashes this morning and with that had anxiety and SOB. Little better after benadryl inj in morning.  REVIEW OF SYSTEMS:  CONSTITUTIONAL: No fever, fatigue or weakness.  EYES: No blurred or double vision.  EARS, NOSE, AND THROAT: No tinnitus or ear pain.  RESPIRATORY: No cough, shortness of breath, wheezing or hemoptysis.  CARDIOVASCULAR: No chest pain, orthopnea, edema.  GASTROINTESTINAL: No nausea, vomiting, diarrhea or abdominal pain.  GENITOURINARY: No dysuria, hematuria.  ENDOCRINE: No polyuria, nocturia,  HEMATOLOGY: No anemia, easy bruising or bleeding SKIN: positive for rash on arms , thighs , back and trunk. MUSCULOSKELETAL: No joint pain or arthritis.   NEUROLOGIC: No tingling, numbness, weakness.  PSYCHIATRY: No anxiety or depression.   ROS  DRUG ALLERGIES:   Allergies  Allergen Reactions  . Ace Inhibitors Other (See Comments)    VITALS:  Blood pressure (!) 161/67, pulse (!) 106, temperature 98.5 F (36.9 C), temperature source Oral, resp. rate (!) 21, height  (1.499 m), weight 83 kg (183 lb), SpO2 95 %.  PHYSICAL EXAMINATION:  GENERAL:  76 y.o.-year-old patient lying in the bed with no acute distress.  EYES: Pupils equal, round, reactive to light and accommodation. No scleral icterus. Extraocular muscles intact.  HEENT: Head atraumatic, normocephalic. Oropharynx and nasopharynx clear.  NECK:  Supple, no jugular venous distention. No thyroid enlargement, no tenderness. LUNGS: Normal breath sounds bilaterally, some wheezing, no crepitation. No use of accessory muscles of respiration.  CARDIOVASCULAR: S1, S2 normal. No  murmurs, rubs, or gallops.  ABDOMEN: Soft, nontender, nondistended. Bowel sounds present. No organomegaly or mass.  EXTREMITIES: No pedal edema, cyanosis, or clubbing.  NEUROLOGIC: Cranial nerves II through XII are intact. Muscle strength 5/5 in all extremities. Sensation intact. Gait not checked.  PSYCHIATRIC: The patient is alert and oriented x 3.  SKIN: have macular and erythematous rash on both arm, fore arms, trunk, back and thighs.   Physical Exam LABORATORY PANEL:   CBC  Recent Labs Lab 03/28/17 0946  WBC 10.1  HGB 13.0  HCT 40.9  PLT 205   ------------------------------------------------------------------------------------------------------------------  Chemistries   Recent Labs Lab 03/28/17 0946  NA 138  K 4.0  CL 103  CO2 30  GLUCOSE 302*  BUN 20  CREATININE 0.90  CALCIUM 8.5*   ------------------------------------------------------------------------------------------------------------------  Cardiac Enzymes  Recent Labs Lab 03/28/17 0946  TROPONINI <0.03   ------------------------------------------------------------------------------------------------------------------  RADIOLOGY:  Dg Chest Port 1 View  Result Date: 03/28/2017 CLINICAL DATA:  Shortness of breath. EXAM: PORTABLE CHEST 1 VIEW COMPARISON:  01/25/2017. FINDINGS: Mediastinum hilar structures normal. Cardiomegaly with normal pulmonary vascularity. No overt congestive heart failure. Low lung volumes. Mild left base pleural-parenchymal thickening consistent with scarring again noted. No pneumothorax. No acute bony abnormality. Degenerative changes thoracic spine with scoliosis concave left. Degenerative changes both shoulders. IMPRESSION: 1. Cardiomegaly.  No evidence of overt congestive heart failure. 2. Mild left base pleuroparenchymal thickening consistent with scarring again noted. Electronically Signed   By: Maisie Fus  Register   On: 03/28/2017 10:05    ASSESSMENT AND PLAN:   Active  Problems:   COPD (chronic obstructive pulmonary disease) (HCC)  1. Acute exacerbation of COPD with chronic respiratory failure: Continue IV steroids, DuoNeb's.  2. Allergic reaction with skin rash: Continue Benadryl and steroids. Patient does not want to see an allergist after discharge   Give xanax to help with anxiety with itching.  3. Diabetes: Diabetes coordinator consultation. ADA diet with sliding scale insulin For now start low-dose Lantus and follow blood sugars  4. hypothyroidism: Continue Synthroid   5. Essential hypertension: Continue losartan and Norvasc  6. Hyperlipidemia: Continue statin 7. Chronic diastolic heart failure: Does not appear to be in exacerbation at this time  8. Proximal atrial fibrillation  9. History of OSA does not use CPAP   All the records are reviewed and case discussed with Care Management/Social Workerr. Management plans discussed with the patient, family and they are in agreement.  CODE STATUS: DNR.  TOTAL TIME TAKING CARE OF THIS PATIENT: 35 minutes.     POSSIBLE D/C IN 1-2 DAYS, DEPENDING ON CLINICAL CONDITION.   Altamese Dilling M.D on 03/29/2017   Between 7am to 6pm - Pager - [phone]  After 6pm go to [email or web address] - password [email or web address]  [facility]  Office  [phone]  CC: Primary care physician; Brayton El, MD  Note: This dictation was prepared with Dragon dictation along with smaller phrase technology. Any transcriptional errors that result from this process are unintentional.

## 2017-03-29 NOTE — Progress Notes (Addendum)
Inpatient Diabetes Program Recommendations  AACE/ADA: New Consensus Statement on Inpatient Glycemic Control (2015)  Target Ranges:  Prepandial:   less than 140 mg/dL      Peak postprandial:   less than 180 mg/dL (1-2 hours)      Critically ill patients:  140 - 180 mg/dL  Results for Karen [name], Karen [name] (MRN [id number]) as of 03/29/2017 14:19  Ref. Range 03/28/2017 15:36 03/28/2017 16:38 03/28/2017 21:09 03/29/2017 07:36 03/29/2017 11:37  Glucose-Capillary Latest Ref Range: 65 - 99 mg/dL 829 (H) 562 (H) 130 (H) 272 (H) 346 (H)    Review of Glycemic Control  Diabetes history: DM2 Outpatient Diabetes medications: 70/30 50 units QAM with breakfast, 70/30 27 units QPM with supper, Humalog 5-20 units with lunch (based on glucose values) Current orders for Inpatient glycemic control: 70/30 30 units BID, Novolog 0-20 units TID with meals  Inpatient Diabetes Program Recommendations: Insulin - Basal: If steroids are continued as ordered, please consider increasing 70/30 to 40 units QAM and 70/30 35 units QPM. Correction (SSI): Please consider ordering Novolog 0-5 units QHS for bedtime correction scale. HgbA1C: A1C 7.9% on 12/19/16.  Please consider ordering a current A1C to evaluate glycemic control over the past 2-3 months.  NOTE: Noted consult for Diabetes Coordinator. Spoke with patient about diabetes and home regimen for diabetes control. Patient reports that she is followed by PCP for diabetes management and currently she takes 70/30 50 units QAM, 70/30 27 units QPM, and Humalog 5-20 units with lunch (based on glucose) as an outpatient for diabetes control. Patient reports that she is taking insulin as prescribed. Patient states that she checks her glucose 5 times per day and that it is usually in the 200's mg/dl range. Patient reports over the past week minimum glucose was 61 mg/dl and maximum glucose was 370 mg/dl. Patient reports that she did have symptoms of hypoglycemia with glucose of 61 mg/dl and it was  around 8:65 pm (before supper).   Inquired about prior A1C and patient reports that she does not recall her last A1C value.  Discussed glucose and A1C goals. Discussed importance of checking CBGs and maintaining good CBG control to prevent long-term and short-term complications. Explained how hyperglycemia leads to damage within blood vessels which lead to the common complications seen with uncontrolled diabetes. Stressed to the patient the importance of improving glycemic control to prevent further complications from uncontrolled diabetes. 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 Encouraged patient to discuss neuropathy with MD and PCP and to be sure to examine feet daily. Discussed impact of nutrition, exercise, stress, sickness, and medications on diabetes control. Patient reports that she chronically takes Prednisone 10 mg daily and has taken it for at least 4-5 years. Patient reports that she lives alone and sometimes she is very short of breath and is not able to cook or prepare meals when experiencing dyspnea. Patient states that she has someone to help her 3 days a week and her family is looking into getting her Meals on Wheels. Encouraged patient to continue checking glucose as she currently does and to keep a log book of glucose readings and insulin taken which she will need to take to doctor appointments. Explained how her PCP can use the log book to continue to make insulin adjustments if needed. Encouraged patient to talk with per PCP about being referred to an Endocrinologist if glucose continues to fluctuate and over 200 mg/dl.  Patient verbalized understanding of information discussed and she  states that she has no further questions at this time related to diabetes.  Thanks, Orlando Penner, RN, MSN, CDE Diabetes Coordinator Inpatient Diabetes Program [phone] (Team Pager)

## 2017-03-30 ENCOUNTER — Ambulatory Visit: Payer: Medicare Other | Admitting: Family Medicine

## 2017-03-30 LAB — COMPREHENSIVE METABOLIC PANEL
ALBUMIN: 3.2 g/dL — AB (ref 3.5–5.0)
ALK PHOS: 44 U/L (ref 38–126)
ALT: 21 U/L (ref 14–54)
ANION GAP: 8 (ref 5–15)
AST: 30 U/L (ref 15–41)
BUN: 30 mg/dL — ABNORMAL HIGH (ref 6–20)
CO2: 33 mmol/L — AB (ref 22–32)
Calcium: 9 mg/dL (ref 8.9–10.3)
Chloride: 103 mmol/L (ref 101–111)
Creatinine, Ser: 1.01 mg/dL — ABNORMAL HIGH (ref 0.44–1.00)
GFR calc Af Amer: >60 mL/min (ref >60)
GFR calc non Af Amer: 53 mL/min — ABNORMAL LOW (ref >60)
GLUCOSE: 207 mg/dL — AB (ref 65–99)
Potassium: 3.8 mmol/L (ref 3.5–5.1)
SODIUM: 144 mmol/L (ref 135–145)
Total Bilirubin: 0.6 mg/dL (ref 0.3–1.2)
Total Protein: 5.8 g/dL — ABNORMAL LOW (ref 6.5–8.1)

## 2017-03-30 LAB — CBC
HEMATOCRIT: 38.3 % (ref 35.0–47.0)
HEMOGLOBIN: 12.3 g/dL (ref 12.0–16.0)
MCH: 28.3 pg (ref 26.0–34.0)
MCHC: 32.2 g/dL (ref 32.0–36.0)
MCV: 88 fL (ref 80.0–100.0)
Platelets: 184 10*3/uL (ref 150–440)
RBC: 4.35 MIL/uL (ref 3.80–5.20)
RDW: 15 % — ABNORMAL HIGH (ref 11.5–14.5)
WBC: 12.7 10*3/uL — ABNORMAL HIGH (ref 3.6–11.0)

## 2017-03-30 LAB — GLUCOSE, CAPILLARY
Glucose-Capillary: 166 mg/dL — ABNORMAL HIGH (ref 65–99)
Glucose-Capillary: 243 mg/dL — ABNORMAL HIGH (ref 65–99)
Glucose-Capillary: 170 mg/dL — ABNORMAL HIGH (ref 65–99)

## 2017-03-30 MED ORDER — ALPRAZOLAM 0.25 MG PO TABS: 0.2500 mg | ORAL_TABLET | Freq: Two times a day (BID) | ORAL | 0 refills | Status: DC | PRN

## 2017-03-30 MED ORDER — BUDESONIDE 0.5 MG/2ML IN SUSP
0.5000 mg | Freq: Two times a day (BID) | RESPIRATORY_TRACT | Status: DC
  Administered 2017-03-30: 0.5 mg via RESPIRATORY_TRACT
  Filled 2017-03-30: qty 2

## 2017-03-30 MED ORDER — LIDOCAINE 5 % EX OINT: TOPICAL_OINTMENT | Freq: Two times a day (BID) | CUTANEOUS | 0 refills | Status: DC | PRN

## 2017-03-30 MED ORDER — DIPHENHYDRAMINE HCL 25 MG PO CAPS: 25.0000 mg | ORAL_CAPSULE | Freq: Two times a day (BID) | ORAL | 0 refills | Status: DC | PRN

## 2017-03-30 MED ORDER — AMLODIPINE BESYLATE 5 MG PO TABS
5.0000 mg | ORAL_TABLET | Freq: Every day | ORAL | Status: DC
  Administered 2017-03-30: 5 mg via ORAL
  Filled 2017-03-30: qty 1

## 2017-03-30 MED ORDER — DIPHENHYDRAMINE HCL 25 MG PO CAPS
25.0000 mg | ORAL_CAPSULE | Freq: Two times a day (BID) | ORAL | Status: DC | PRN
  Filled 2017-03-30: qty 1

## 2017-03-30 MED ORDER — AMLODIPINE BESYLATE 5 MG PO TABS: 5.0000 mg | ORAL_TABLET | Freq: Every day | ORAL | 0 refills | Status: DC

## 2017-03-30 MED ORDER — METHYLPREDNISOLONE SODIUM SUCC 40 MG IJ SOLR
40.0000 mg | Freq: Two times a day (BID) | INTRAMUSCULAR | Status: DC
  Administered 2017-03-30: 40 mg via INTRAVENOUS
  Filled 2017-03-30: qty 1

## 2017-03-30 MED ORDER — CEPHALEXIN 500 MG PO CAPS: 500.0000 mg | ORAL_CAPSULE | Freq: Three times a day (TID) | ORAL | 0 refills | Status: DC

## 2017-03-30 MED ORDER — PREDNISONE 10 MG (21) PO TBPK: ORAL_TABLET | ORAL | 0 refills | Status: DC

## 2017-03-30 NOTE — Care Management Note (Signed)
Case Management Note  Patient Details  Name: Karen [name] MRN: [id number] Date of Birth: [birth year]   Patient admitted for COPD exacerbation and allergic reaction.  Patient lives at home alone.  Adult children live locally for support.  Patient has Rw in the home for ambulation.  Patient open with [facility] for RN services.  [place] [facility] notified of Admission.  Requested resumption of care orders for discharge.  Patient also has PCS services through [facility].  Patient uses chronic home O2.  Family to bring portable tank for discharge.  RNCM signing off.   Subjective/Objective:                    Action/Plan:   Expected Discharge Date:  03/30/17               Expected Discharge Plan:  Home w Home Health Services  In-House Referral:     Discharge planning Services  CM Consult  Post Acute Care Choice:  Resumption of Svcs/PTA Provider Choice offered to:     DME Arranged:    DME Agency:     HH Arranged:  RN HH Agency:     Status of Service:  Completed, signed off  If discussed at [employer] of Stay Meetings, dates discussed:    Additional Comments:  Chapman Fitch, RN 03/30/2017, 10:29 AM

## 2017-03-30 NOTE — Discharge Summary (Signed)
[facility] [place] at [facility]   PATIENT NAME: Karen [name]    MR#:  [id number]  DATE OF BIRTH:  [birth year]  DATE OF ADMISSION:  03/28/2017 ADMITTING PHYSICIAN: Adrian Saran, MD  DATE OF DISCHARGE: 03/30/2017  PRIMARY CARE PHYSICIAN: Brayton El, MD    ADMISSION DIAGNOSIS:  COPD exacerbation (HCC) [J44.1] Atrial fibrillation with rapid ventricular response (HCC) [I48.91] Allergic reaction, initial encounter [T78.40XA]  DISCHARGE DIAGNOSIS:  Active Problems:   COPD (chronic obstructive pulmonary disease) (HCC)    Allergic rash  SECONDARY DIAGNOSIS:   Past Medical History:  Diagnosis Date  . COPD (chronic obstructive pulmonary disease) (HCC)   . Diabetes mellitus without complication (HCC)   . Hyperlipidemia   . Hypertension     HOSPITAL COURSE:   1. Acute exacerbation of COPD with chronic respiratory failure: Continue IV steroids, DuoNeb's.   Better now, pt said - she have terminal COPD and have wheezing always.  2. Allergic reaction with skin rash: Continue Benadryl and steroids. Patient does not want to see an allergist after discharge   Give xanax to help with anxiety with itching.    Rashes improved compared to yesterday.    Added local lidocaine cream to help.     3. Diabetes: Diabetes coordinator consultation. ADA diet with sliding scale insulin For now start low-dose Lantus and follow blood sugars  4. hypothyroidism: Continue Synthroid   5. Essential hypertension: Continue losartan and Norvasc  6. Hyperlipidemia: Continue statin 7. Chronic diastolic heart failure: Does not appear to be in exacerbation at this time  8. Proximal atrial fibrillation  9. History of OSA does not use CPAP   DISCHARGE CONDITIONS:   Stable.  CONSULTS OBTAINED:    DRUG ALLERGIES:   Allergies  Allergen Reactions  . Ace Inhibitors Other (See Comments)    DISCHARGE MEDICATIONS:   Current Discharge Medication List    START taking  these medications   Details  ALPRAZolam (XANAX) 0.25 MG tablet Take 1 tablet (0.25 mg total) by mouth 2 (two) times daily as needed for anxiety. Qty: 20 tablet, Refills: 0    cephALEXin (KEFLEX) 500 MG capsule Take 1 capsule (500 mg total) by mouth every 8 (eight) hours. Qty: 6 capsule, Refills: 0    diphenhydrAMINE (BENADRYL) 25 mg capsule Take 1 capsule (25 mg total) by mouth 2 (two) times daily as needed for itching or allergies. Qty: 10 capsule, Refills: 0    lidocaine (XYLOCAINE) 5 % ointment Apply topically 2 (two) times daily as needed (itching). Qty: 150 g, Refills: 0      CONTINUE these medications which have CHANGED   Details  amLODipine (NORVASC) 5 MG tablet Take 1 tablet (5 mg total) by mouth daily. Qty: 30 tablet, Refills: 0    predniSONE (STERAPRED UNI-PAK 21 TAB) 10 MG (21) TBPK tablet Take 6 tabs first 2 day, 5 tab on day 3&4th , then 4tablets on day 5 & 6th, 3 tabs on day 7 & 8th , 2 tab on day 9 & 10 th, and 1 tab on  11& 12 th day. Qty: 42 tablet, Refills: 0   Associated Diagnoses: COPD exacerbation (HCC)      CONTINUE these medications which have NOT CHANGED   Details  clopidogrel (PLAVIX) 75 MG tablet Take 1 tablet (75 mg total) by mouth daily. Qty: 90 tablet, Refills: 3    digoxin (LANOXIN) 0.125 MG tablet Take 0.125 mg by mouth every other day.     ferrous sulfate 325 (65  FE) MG EC tablet Take 325 mg by mouth every other day.     furosemide (LASIX) 40 MG tablet Take 40 mg by mouth daily.    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 WAIT 30 MINUTES BEFORE TAKING OTHER MEDS. Qty: 90 tablet, Refills: 1    losartan (COZAAR) 100 MG tablet Take 1 tablet (100 mg total) by mouth daily. Qty: 90 tablet, Refills: 0   Associated Diagnoses: Essential hypertension    pantoprazole (PROTONIX) 40 MG tablet Take 1 tablet (40 mg total) by mouth daily. Qty: 90 tablet, Refills: 0    pravastatin (PRAVACHOL) 40 MG tablet Take 1 tablet (40 mg total) by mouth daily. Qty: 90 tablet, Refills: 1   Associated Diagnoses: Hyperlipidemia, unspecified hyperlipidemia type    raloxifene (EVISTA) 60 MG tablet Take 1 tablet (60 mg total) by mouth daily. Qty: 30 tablet, Refills: 11    VENTOLIN HFA 108 (90 BASE) MCG/ACT inhaler     vitamin E 400 UNIT capsule Take 400 Units by mouth daily.    B-D INS SYRINGE 0.5CC/31GX5/16 31G X 5/16" 0.5 ML MISC USE TO INJECT INSULIN TWICE DAILY. 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 Qty: 90 each, Refills: 3    PULMICORT FLEXHALER 180 MCG/ACT inhaler       STOP taking these medications     doxycycline (VIBRA-TABS) 100 MG tablet          DISCHARGE INSTRUCTIONS:    Follow with PMD in 1-2 weeks.  If you experience worsening of your admission symptoms, develop shortness of breath, life threatening emergency, suicidal or homicidal thoughts you must seek medical attention immediately by calling 911 or calling your MD immediately  if symptoms less severe.  You Must read complete instructions/literature along with all the possible adverse reactions/side effects for all the Medicines you take and that have been prescribed to you. Take  any new Medicines after you have completely understood and accept all the possible adverse reactions/side effects.   Please note  You were cared for by a hospitalist during your hospital stay. If you have any questions about your discharge medications or the care you received while you were in the hospital after you are discharged, you can call the unit and asked to speak with the hospitalist on call if the hospitalist that took care of you is not available. Once you are discharged, your primary care physician will handle any further medical issues. Please note that NO REFILLS for any discharge medications will be authorized once you are discharged, as it is imperative that you return to your primary care physician (or establish a relationship with a primary care physician if you do not have one) for your aftercare needs  so that they can reassess your need for medications and monitor your lab values.    Today   CHIEF COMPLAINT:   Chief Complaint  Patient presents with  . Shortness of Breath  . Allergic Reaction    HISTORY OF PRESENT ILLNESS:  Amberlin Utke  is a 76 y.o. female with a known history of COPD with chronic respiratory failure on 2-3 L of oxygen and diabetes who presents with above complaint. Patient reports since Friday she developed a pruritic rash on her chest, trunk and arms. She has not had any new antibiotics or any new change in her regimen. She has been taking Benadryl for the itching sensation. She has not developed hives. She has a history of allergies and has been to an allergist in the past. She also complains of increasing shortness of breath, wheezing and dyspnea exertion. She denies PND, orthopnea or chest pain.. She has developed increasing lower extremity edema and her Lasix has been increased from 40 mg daily to twice a day.  In the emergency room she has been treated for her rash/allergic reaction with Benadryl and steroids as well as for COPD exacerbation. She  continues to have dyspnea exertion and was found to have tachycardia and tachypnea and therefore will require admission.   VITAL SIGNS:  Blood pressure (!) 108/44, pulse 87, temperature 98 F (36.7 C), temperature source Oral, resp. rate 20, height  (1.499 m), weight 83 kg (183 lb), SpO2 100 %.  I/O:   Intake/Output Summary (Last 24 hours) at 03/30/17 0923 Last data filed at 03/29/17 2328  Gross per 24 hour  Intake              480 ml  Output                0 ml  Net              480 ml    PHYSICAL EXAMINATION:   GENERAL:  76 y.o.-year-old patient lying in the bed with no acute distress.  EYES: Pupils equal, round, reactive to light and accommodation. No scleral icterus. Extraocular muscles intact.  HEENT: Head atraumatic, normocephalic. Oropharynx and nasopharynx clear.  NECK:  Supple, no jugular venous distention. No thyroid enlargement, no tenderness. LUNGS: Normal breath sounds bilaterally, some wheezing, no crepitation. No use of accessory muscles of respiration.  CARDIOVASCULAR: S1, S2 normal. No murmurs, rubs, or gallops.  ABDOMEN: Soft, nontender, nondistended. Bowel sounds present. No organomegaly or mass.  EXTREMITIES: No pedal edema, cyanosis, or clubbing.  NEUROLOGIC: Cranial nerves II through XII are intact. Muscle strength 5/5 in all extremities. Sensation intact. Gait not checked.  PSYCHIATRIC: The patient is alert and oriented x 3.  SKIN: have macular and erythematous rash on both arm, fore arms, trunk, back and thighs- better than yesterday.   DATA REVIEW:   CBC  Recent Labs Lab 03/30/17 0502  WBC 12.7*  HGB 12.3  HCT 38.3  PLT 184    Chemistries   Recent Labs Lab 03/30/17 0502  NA 144  K 3.8  CL 103  CO2 33*  GLUCOSE 207*  BUN 30*  CREATININE 1.01*  CALCIUM 9.0  AST 30  ALT 21  ALKPHOS 44  BILITOT 0.6    Cardiac Enzymes  Recent Labs Lab 03/28/17 0946  TROPONINI <0.03    Microbiology Results  Results for orders placed or  performed in visit on 10/19/15  Wound culture     Status: Abnormal   Collection Time: 10/19/15 12:00  AM  Result Value Ref Range Status   Aerobic Bacterial Culture Final report (A)  Final   Result 1 Staphylococcus aureus (A)  Final    Comment: Heavy growth Most isolates of Staphylococcus sp. produce a beta-lactamase enzyme rendering them resistant to penicillin. Please contact the laboratory if penicillin is being considered for therapy. Based on susceptibility to oxacillin this isolate would be susceptible to: * Beta-lactam/beta-lactamase inhibitor combinations; such as:     Amoxicillin-clavulanic acid     Ampicillin-sulbactam * Antistaphylococcal cephems; such as:     Cefaclor     Cefuroxime * Antistaphylococcal carbapenems; such as:     Imipenem     Meropenem    ANTIMICROBIAL SUSCEPTIBILITY Comment  Final    Comment:       ** S = Susceptible; I = Intermediate; R = Resistant **                    P = Positive; N = Negative             MICS are expressed in micrograms per mL    Antibiotic                 RSLT#1    RSLT#2    RSLT#3    RSLT#4 Ciprofloxacin                  R Clindamycin                    R Erythromycin                   R Gentamicin                     S Levofloxacin                   I Linezolid                      S Moxifloxacin                   I Oxacillin                      S Quinupristin/Dalfopristin      S Rifampin                       S Tetracycline                   S Trimethoprim/Sulfa             S Vancomycin                     S     RADIOLOGY:  Dg Chest Port 1 View  Result Date: 03/28/2017 CLINICAL DATA:  Shortness of breath. EXAM: PORTABLE CHEST 1 VIEW COMPARISON:  01/25/2017. FINDINGS: Mediastinum hilar structures normal. Cardiomegaly with normal pulmonary vascularity. No overt congestive heart failure. Low lung volumes. Mild left base pleural-parenchymal thickening consistent with scarring again noted. No pneumothorax. No acute bony  abnormality. Degenerative changes thoracic spine with scoliosis concave left. Degenerative changes both shoulders. IMPRESSION: 1. Cardiomegaly.  No evidence of overt congestive heart failure. 2. Mild left base pleuroparenchymal thickening consistent with scarring again noted. Electronically Signed   By: Maisie Fus  Register   On: 03/28/2017 10:05    EKG:   Orders placed or performed during the hospital  encounter of 03/28/17  . ED EKG  . ED EKG  . EKG 12-Lead  . EKG 12-Lead      Management plans discussed with the patient, family and they are in agreement.  CODE STATUS:     Code Status Orders        Start     Ordered   03/28/17 1510  Do not attempt resuscitation (DNR)  Continuous    Question Answer Comment  In the event of cardiac or respiratory ARREST Do not call a "code blue"   In the event of cardiac or respiratory ARREST Do not perform Intubation, CPR, defibrillation or ACLS   In the event of cardiac or respiratory ARREST Use medication by any route, position, wound care, and other measures to relive pain and suffering. May use oxygen, suction and manual treatment of airway obstruction as needed for comfort.      03/28/17 1509    Code Status History    Date Active Date Inactive Code Status Order ID Comments User Context   This patient has a current code status but no historical code status.    Advance Directive Documentation     Most Recent Value  Type of Advance Directive  Healthcare Power of Attorney, Living will  Pre-existing out of facility DNR order (yellow form or pink MOST form)  -  "MOST" Form in Place?  -      TOTAL TIME TAKING CARE OF THIS PATIENT: 35 minutes.    Altamese Dilling M.D on 03/30/2017 at 9:23 AM  Between 7am to 6pm - Pager - [phone]  After 6pm go to [email or web address] - password [email or web address]  [facility] [facility]  Office  [phone]  CC: Primary care physician; Brayton El, MD   Note: This dictation was prepared with Dragon  dictation along with smaller phrase technology. Any transcriptional errors that result from this process are unintentional.

## 2017-03-30 NOTE — Progress Notes (Signed)
Inpatient Diabetes Program Recommendations  AACE/ADA: New Consensus Statement on Inpatient Glycemic Control (2015)  Target Ranges:  Prepandial:   less than 140 mg/dL      Peak postprandial:   less than 180 mg/dL (1-2 hours)      Critically ill patients:  140 - 180 mg/dL   Lab Results  Component Value Date   GLUCAP 166 (H) 03/30/2017   HGBA1C 7.9 12/19/2016    Review of Glycemic Control  Results for Karen [name], Karen [name] (MRN [id number]) as of 03/30/2017 08:33  Ref. Range 03/29/2017 07:36 03/29/2017 11:37 03/29/2017 16:42 03/29/2017 21:26 03/30/2017 08:05  Glucose-Capillary Latest Ref Range: 65 - 99 mg/dL 409 (H) 811 (H) 914 (H) 170 (H) 166 (H)   Diabetes history: DM2  Outpatient Diabetes medications: 70/30 50 units QAM with breakfast, 70/30 27 units QPM with supper, Humalog 5-20 units with lunch (based on glucose values)  Current orders for Inpatient glycemic control: 70/30 30 units BID, Novolog 0-20 units TID with meals  Inpatient Diabetes Program Recommendations:  If steroids are continued as ordered, please consider increasing 70/30 to 35 units QAM and keep 70/30 30 units QPM.   Please consider ordering Novolog 0-5 units QHS for bedtime correction scale.  HgbA1C: A1C 7.9% on 12/19/16.  Please consider ordering a current A1C to evaluate glycemic control over the past 2-3 months.  Susette Racer, RN, BA, MHA, CDE Diabetes Coordinator Inpatient Diabetes Program  [phone] (Team Pager) [phone] [facility] Office) 03/30/2017 8:41 AM

## 2017-03-30 NOTE — Care Management Important Message (Signed)
Important Message  Patient Details  Name: Karen [name] MRN: [id number] Date of Birth: [birth year]   Medicare Important Message Given:  N/A - LOS <3 / Initial given by admissions    Chapman Fitch, RN 03/30/2017, 10:27 AM

## 2017-03-30 NOTE — Progress Notes (Signed)
IV was removed. Discharge instructions, follow-up appointments, and prescriptions were provided to the pt and family at bedside. All questions were answered. The pt was taken downstairs via wheelchair by NT.

## 2017-03-30 NOTE — Progress Notes (Signed)
md notified of low dbp. Orders given to change daily BP med dose.

## 2017-04-05 ENCOUNTER — Encounter: Payer: Self-pay | Admitting: Family Medicine

## 2017-04-05 ENCOUNTER — Ambulatory Visit: Payer: Medicare Other | Admitting: Family Medicine

## 2017-04-05 ENCOUNTER — Ambulatory Visit (INDEPENDENT_AMBULATORY_CARE_PROVIDER_SITE_OTHER): Payer: Medicare Other | Admitting: Family Medicine

## 2017-04-05 VITALS — BP 135/76 | HR 123 | Temp 98.2°F | Resp 18 | Ht 59.0 in | Wt 182.3 lb

## 2017-04-05 DIAGNOSIS — J441 Chronic obstructive pulmonary disease with (acute) exacerbation: Secondary | ICD-10-CM | POA: Diagnosis not present

## 2017-04-05 DIAGNOSIS — T7840XD Allergy, unspecified, subsequent encounter: Secondary | ICD-10-CM | POA: Diagnosis not present

## 2017-04-05 DIAGNOSIS — F419 Anxiety disorder, unspecified: Secondary | ICD-10-CM | POA: Diagnosis not present

## 2017-04-05 NOTE — Progress Notes (Signed)
Name: Karen [name]   MRN: [id number]    DOB: [birth year]   Date:04/05/2017       Progress Note  Subjective  Chief Complaint  Chief Complaint  Patient presents with  . Hospitalization Follow-up    4/24/218 [facility]    HPI  Patient presents for hospital follow up for COPD exacerbation and a generalized pruritic rash on her chest, trunk, and arms. Rash appeared abruptly, no change in medications (although patient reports the dose of Furosemide was increased by Cardiologist for fluid in her legs). She also had shortness of breath and coughing and was admitted for COPD exacerbation. Initially had Benadryl 50 mg 4 times a day, IV steroids for COPD exacerbation. By the day of discharge, her rash had considerably improved, as did her breathing.  She feels well today   Past Medical History:  Diagnosis Date  . COPD (chronic obstructive pulmonary disease) (HCC)   . Diabetes mellitus without complication (HCC)   . Hyperlipidemia   . Hypertension     Past Surgical History:  Procedure Laterality Date  . CHOLECYSTECTOMY    . NO PAST SURGERIES      Family History  Problem Relation Age of Onset  . Heart disease Mother   . Heart disease Father     Social History   Social History  . Marital status: Single    Spouse name: N/A  . Number of children: N/A  . Years of education: N/A   Occupational History  . Not on file.   Social History Main Topics  . Smoking status: Never Smoker  . Smokeless tobacco: Never Used  . Alcohol use No  . Drug use: No  . Sexual activity: No   Other Topics Concern  . Not on file   Social History Narrative  . No narrative on file     Current Outpatient Prescriptions:  .  ALPRAZolam (XANAX) 0.25 MG tablet, Take 1 tablet (0.25 mg total) by mouth 2 (two) times daily as needed for anxiety., Disp: 20 tablet, Rfl: 0 .  amLODipine (NORVASC) 5 MG tablet, Take 1 tablet (5 mg total) by mouth daily., Disp: 30 tablet, Rfl: 0 .  B-D INS SYRINGE 0.5CC/31GX5/16 31G X  5/16" 0.5 ML MISC, USE TO INJECT INSULIN TWICE DAILY., Disp: 100 each, Rfl: 6 .  cephALEXin (KEFLEX) 500 MG capsule, Take 1 capsule (500 mg total) by mouth every 8 (eight) hours., Disp: 6 capsule, Rfl: 0 .  clopidogrel (PLAVIX) 75 MG tablet, Take 1 tablet (75 mg total) by mouth daily., Disp: 90 tablet, Rfl: 3 .  digoxin (LANOXIN) 0.125 MG tablet, Take 0.125 mg by mouth every other day. , Disp: , Rfl:  .  diphenhydrAMINE (BENADRYL) 25 mg capsule, Take 1 capsule (25 mg total) by mouth 2 (two) times daily as needed for itching or allergies., Disp: 10 capsule, Rfl: 0 .  ferrous sulfate 325 (65 FE) MG EC tablet, Take 325 mg by mouth every other day. , Disp: , Rfl:  .  furosemide (LASIX) 40 MG tablet, Take 40 mg by mouth daily., Disp: , Rfl:  .  HUMULIN 70/30 (70-30) 100 UNIT/ML injection, INJECT 50 UNITS SUBCUTANEOUSLY ONCE IN THE MORNING AND 27 UNITS EVERYEVENING, Disp: 20 mL, Rfl: 2 .  insulin lispro (HUMALOG KWIKPEN) 100 UNIT/ML KiwkPen, Use as directed according to Sliding Scale. (Patient taking differently: Use as directed according to Sliding Scale.), Disp: 15 mL, Rfl: 2 .  Insulin Pen Needle (UNIFINE PENTIPS) 31G X 6 MM MISC, Use as directed  to inject Insulin., Disp: 90 each, Rfl: 3 .  ipratropium-albuterol (DUONEB) 0.5-2.5 (3) MG/3ML SOLN, USE 1 VIAL VIA NEBULIZER EVERY 6 HOURS AS NEEDED, Disp: 360 mL, Rfl: 2 .  levothyroxine (SYNTHROID, LEVOTHROID) 150 MCG tablet, TAKE 1 TABLET BY MOUTH ONCE DAILY ON AN EMPTY STOMACH. WAIT 30 MINUTES BEFORE TAKING OTHER MEDS., Disp: 90 tablet, Rfl: 1 .  lidocaine (XYLOCAINE) 5 % ointment, Apply topically 2 (two) times daily as needed (itching)., Disp: 150 g, Rfl: 0 .  losartan (COZAAR) 100 MG tablet, Take 1 tablet (100 mg total) by mouth daily., Disp: 90 tablet, Rfl: 0 .  pantoprazole (PROTONIX) 40 MG tablet, Take 1 tablet (40 mg total) by mouth daily., Disp: 90 tablet, Rfl: 0 .  pravastatin (PRAVACHOL) 40 MG tablet, Take 1 tablet (40 mg total) by mouth daily.,  Disp: 90 tablet, Rfl: 1 .  predniSONE (STERAPRED UNI-PAK 21 TAB) 10 MG (21) TBPK tablet, Take 6 tabs first 2 day, 5 tab on day 3&4th , then 4tablets on day 5 & 6th, 3 tabs on day 7 & 8th , 2 tab on day 9 & 10 th, and 1 tab on  11& 12 th day., Disp: 42 tablet, Rfl: 0 .  PULMICORT FLEXHALER 180 MCG/ACT inhaler, , Disp: , Rfl:  .  raloxifene (EVISTA) 60 MG tablet, Take 1 tablet (60 mg total) by mouth daily., Disp: 30 tablet, Rfl: 11 .  VENTOLIN HFA 108 (90 BASE) MCG/ACT inhaler, , Disp: , Rfl:  .  vitamin E 400 UNIT capsule, Take 400 Units by mouth daily., Disp: , Rfl:   Allergies  Allergen Reactions  . Ace Inhibitors Other (See Comments)     ROS  Please see history of present illness for complete discussion of ROS  Objective  Vitals:   04/05/17 1549  BP: 135/76  Pulse: (!) 123  Resp: 18  Temp: 98.2 F (36.8 C)  TempSrc: Oral  SpO2: 95%  Weight: 182 lb 4.8 oz (82.7 kg)  Height:  (1.499 m)    Physical Exam  Constitutional: She is oriented to person, place, and time and well-developed, well-nourished, and in no distress.  HENT:  Head: Normocephalic and atraumatic.  Cardiovascular: Normal rate, regular rhythm and normal heart sounds.   No murmur heard. Pulmonary/Chest: Effort normal and breath sounds normal. She has no wheezes.  Neurological: She is alert and oriented to person, place, and time.  Psychiatric: Mood, memory, affect and judgment normal.  Nursing note and vitals reviewed.    Assessment & Plan  1. COPD exacerbation (HCC) On tapering course of prednisone, on home oxygen at baseline. Continue on the inhalers  2. Allergic reaction, subsequent encounter Unclear trigger, she has taken Benadryl and prednisone, rash as resolved, patient does refuse referral to allergy specialist at this time, no further workup for now  3. Anxiety Likely from allergic reaction versus hypoxia, have discussed taking alprazolam as needed during acute anxiety attacks however  benzodiazepines unknown to suppress her respiratory drive and so patient must be cautious when taking benzodiazepines.   Karen [name] Karen [name] [facility] 04/05/2017 3:56 PM

## 2017-04-16 ENCOUNTER — Other Ambulatory Visit: Payer: Self-pay | Admitting: Family Medicine

## 2017-04-21 ENCOUNTER — Ambulatory Visit (INDEPENDENT_AMBULATORY_CARE_PROVIDER_SITE_OTHER): Payer: Medicare Other | Admitting: Vascular Surgery

## 2017-05-04 ENCOUNTER — Telehealth: Payer: Self-pay | Admitting: Family Medicine

## 2017-05-04 NOTE — Telephone Encounter (Signed)
ERRENOUS °

## 2017-05-09 ENCOUNTER — Ambulatory Visit (INDEPENDENT_AMBULATORY_CARE_PROVIDER_SITE_OTHER): Payer: Medicare Other | Admitting: Family Medicine

## 2017-05-09 ENCOUNTER — Encounter: Payer: Self-pay | Admitting: Family Medicine

## 2017-05-09 VITALS — BP 104/58 | HR 110 | Temp 98.1°F | Resp 18 | Ht <= 58 in | Wt 185.0 lb

## 2017-05-09 DIAGNOSIS — E7800 Pure hypercholesterolemia, unspecified: Secondary | ICD-10-CM

## 2017-05-09 DIAGNOSIS — I10 Essential (primary) hypertension: Secondary | ICD-10-CM | POA: Diagnosis not present

## 2017-05-09 DIAGNOSIS — E1165 Type 2 diabetes mellitus with hyperglycemia: Secondary | ICD-10-CM

## 2017-05-09 DIAGNOSIS — IMO0001 Reserved for inherently not codable concepts without codable children: Secondary | ICD-10-CM

## 2017-05-09 DIAGNOSIS — Z794 Long term (current) use of insulin: Secondary | ICD-10-CM

## 2017-05-09 LAB — GLUCOSE, POCT (MANUAL RESULT ENTRY): POC GLUCOSE: 203 mg/dL — AB (ref 70–99)

## 2017-05-09 LAB — POCT GLYCOSYLATED HEMOGLOBIN (HGB A1C): HEMOGLOBIN A1C: 8.3

## 2017-05-09 MED ORDER — LOSARTAN POTASSIUM 100 MG PO TABS: 100.0000 mg | ORAL_TABLET | Freq: Every day | ORAL | 1 refills | Status: DC

## 2017-05-09 MED ORDER — INSULIN NPH ISOPHANE & REGULAR (70-30) 100 UNIT/ML ~~LOC~~ SUSP: SUBCUTANEOUS | 2 refills | Status: DC

## 2017-05-09 MED ORDER — PRAVASTATIN SODIUM 40 MG PO TABS: 40.0000 mg | ORAL_TABLET | Freq: Every day | ORAL | 1 refills | Status: DC

## 2017-05-09 MED ORDER — AMLODIPINE BESYLATE 5 MG PO TABS: 5.0000 mg | ORAL_TABLET | Freq: Every day | ORAL | 1 refills | Status: DC

## 2017-05-09 MED ORDER — "BD INSULIN SYR ULTRAFINE II 31G X 5/16"" 0.5 ML MISC": 6 refills | Status: DC

## 2017-05-09 MED ORDER — INSULIN LISPRO 100 UNIT/ML (KWIKPEN): PEN_INJECTOR | SUBCUTANEOUS | 2 refills | Status: DC

## 2017-05-09 NOTE — Progress Notes (Signed)
Name: Karen [name]   MRN: [id number]    DOB: Sep 08, 1941   Date:05/09/2017       Progress Note  Subjective  Chief Complaint  Chief Complaint  Patient presents with  . Annual Exam    Diabetes  She presents for her follow-up diabetic visit. She has type 2 diabetes mellitus. Her disease course has been stable. Hypoglycemia symptoms include dizziness, headaches and sleepiness. Associated symptoms include foot paresthesias, polydipsia and polyuria. Pertinent negatives for diabetes include no blurred vision and no chest pain. Diabetic complications include a CVA. Pertinent negatives for diabetic complications include no heart disease. Current diabetic treatment includes intensive insulin program. She is following a diabetic (She gets Meals on Wheels) diet. She monitors blood glucose at home 3-4 x per day. Her breakfast blood glucose range is generally 130-140 mg/dl. An ACE inhibitor/angiotensin II receptor blocker is being taken.  Hyperlipidemia  This is a chronic problem. The problem is controlled. Recent lipid tests were reviewed and are normal. Exacerbating diseases include diabetes. Pertinent negatives include no chest pain, leg pain, myalgias or shortness of breath. Current antihyperlipidemic treatment includes statins. Risk factors for coronary artery disease include diabetes mellitus.  Hypertension  This is a chronic problem. The problem is unchanged. The problem is controlled. Associated symptoms include headaches. Pertinent negatives include no blurred vision, chest pain, palpitations or shortness of breath. Past treatments include calcium channel blockers and angiotensin blockers. Hypertensive end-organ damage includes CVA.       Past Medical History:  Diagnosis Date  . COPD (chronic obstructive pulmonary disease) (HCC)   . Diabetes mellitus without complication (HCC)   . Hyperlipidemia   . Hypertension     Past Surgical History:  Procedure Laterality Date  . CHOLECYSTECTOMY  1993     Family History  Problem Relation Age of Onset  . Heart disease Mother   . Heart disease Father   . Heart disease Sister   . Dementia Sister   . Heart disease Brother   . Diabetes Paternal Grandmother     Social History   Social History  . Marital status: Single    Spouse name: N/A  . Number of children: N/A  . Years of education: N/A   Occupational History  . Not on file.   Social History Main Topics  . Smoking status: Never Smoker  . Smokeless tobacco: Never Used  . Alcohol use No  . Drug use: No  . Sexual activity: No   Other Topics Concern  . Not on file   Social History Narrative  . No narrative on file     Current Outpatient Prescriptions:  .  ALPRAZolam (XANAX) 0.25 MG tablet, Take 1 tablet (0.25 mg total) by mouth 2 (two) times daily as needed for anxiety., Disp: 20 tablet, Rfl: 0 .  amLODipine (NORVASC) 5 MG tablet, Take 1 tablet (5 mg total) by mouth daily., Disp: 30 tablet, Rfl: 0 .  B-D INS SYRINGE 0.5CC/31GX5/16 31G X 5/16" 0.5 ML MISC, USE TO INJECT INSULIN TWICE DAILY., Disp: 100 each, Rfl: 6 .  clopidogrel (PLAVIX) 75 MG tablet, Take 1 tablet (75 mg total) by mouth daily., Disp: 90 tablet, Rfl: 3 .  digoxin (LANOXIN) 0.125 MG tablet, Take 0.125 mg by mouth every other day. , Disp: , Rfl:  .  diphenhydrAMINE (BENADRYL) 25 mg capsule, Take 1 capsule (25 mg total) by mouth 2 (two) times daily as needed for itching or allergies., Disp: 10 capsule, Rfl: 0 .  ferrous sulfate 325 (65  FE) MG EC tablet, Take 325 mg by mouth every other day. , Disp: , Rfl:  .  furosemide (LASIX) 80 MG tablet, Take 80 mg by mouth daily. , Disp: , Rfl:  .  HUMULIN 70/30 (70-30) 100 UNIT/ML injection, INJECT 50 UNITS SUBCUTANEOUSLY ONCE IN THE MORNING AND 27 UNITS EVERYEVENING, Disp: 20 mL, Rfl: 2 .  insulin lispro (HUMALOG KWIKPEN) 100 UNIT/ML KiwkPen, Use as directed according to Sliding Scale. (Patient taking differently: Use as directed according to Sliding Scale.), Disp: 15  mL, Rfl: 2 .  Insulin Pen Needle (UNIFINE PENTIPS) 31G X 6 MM MISC, Use as directed to inject Insulin., Disp: 90 each, Rfl: 3 .  ipratropium-albuterol (DUONEB) 0.5-2.5 (3) MG/3ML SOLN, USE 1 VIAL VIA NEBULIZER EVERY 6 HOURS AS NEEDED, Disp: 360 mL, Rfl: 2 .  levothyroxine (SYNTHROID, LEVOTHROID) 150 MCG tablet, TAKE 1 TABLET BY MOUTH ONCE DAILY ON AN EMPTY STOMACH. WAIT 30 MINUTES BEFORE TAKING OTHER MEDS., Disp: 90 tablet, Rfl: 1 .  losartan (COZAAR) 100 MG tablet, Take 1 tablet (100 mg total) by mouth daily., Disp: 90 tablet, Rfl: 0 .  pantoprazole (PROTONIX) 40 MG tablet, TAKE 1 TABLET BY MOUTH ONCE DAILY, Disp: 90 tablet, Rfl: 0 .  pravastatin (PRAVACHOL) 40 MG tablet, Take 1 tablet (40 mg total) by mouth daily., Disp: 90 tablet, Rfl: 1 .  predniSONE (DELTASONE) 10 MG tablet, Take 1 tablet by mouth daily., Disp: , Rfl:  .  PULMICORT FLEXHALER 180 MCG/ACT inhaler, , Disp: , Rfl:  .  raloxifene (EVISTA) 60 MG tablet, Take 1 tablet (60 mg total) by mouth daily., Disp: 30 tablet, Rfl: 11 .  VENTOLIN HFA 108 (90 BASE) MCG/ACT inhaler, , Disp: , Rfl:  .  vitamin E 400 UNIT capsule, Take 400 Units by mouth daily., Disp: , Rfl:   Allergies  Allergen Reactions  . Ace Inhibitors Other (See Comments)    Cough      Review of Systems  Eyes: Negative for blurred vision.  Respiratory: Negative for shortness of breath.   Cardiovascular: Negative for chest pain and palpitations.  Musculoskeletal: Negative for myalgias.  Neurological: Positive for dizziness and headaches.  Endo/Heme/Allergies: Positive for polydipsia.      Objective  Vitals:   05/09/17 0856  BP: (!) 104/58  Pulse: (!) 110  Resp: 18  Temp: 98.1 F (36.7 C)  TempSrc: Oral  SpO2: 93%  Weight: 185 lb (83.9 kg)  Height: 4\' 9"  (1.448 m)    Physical Exam  Constitutional: She is oriented to person, place, and time and well-developed, well-nourished, and in no distress.  HENT:  Head: Normocephalic and atraumatic.   Cardiovascular: Normal rate, regular rhythm and normal heart sounds.   Pulmonary/Chest: She is in respiratory distress. She has wheezes. She has no rhonchi.  Abdominal: Soft. Bowel sounds are normal. There is no tenderness.  Musculoskeletal: She exhibits edema and tenderness.  Neurological: She is alert and oriented to person, place, and time.  Psychiatric: Mood, memory, affect and judgment normal.  Nursing note and vitals reviewed.      Assessment & Plan  1. 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 No change in insulin dosing, refills provided - POCT HgB A1C - POCT Glucose (CBG) - insulin lispro (HUMALOG KWIKPEN) 100 UNIT/ML KiwkPen; Use as directed according to Sliding Scale.  Dispense: 15 mL; Refill: 2 - insulin NPH-regular Human (HUMULIN 70/30) (70-30) 100 UNIT/ML injection; INJECT 50 UNITS SUBCUTANEOUSLY ONCE  IN THE MORNING AND 27 UNITS EVERYEVENING  Dispense: 20 mL; Refill: 2 - B-D INS SYRINGE 0.5CC/31GX5/16 31G X 5/16" 0.5 ML MISC; USE TO INJECT INSULIN TWICE DAILY.  Dispense: 100 each; Refill: 6  2. Pure hypercholesterolemia  - Lipid panel - COMPLETE METABOLIC PANEL WITH GFR - pravastatin (PRAVACHOL) 40 MG tablet; Take 1 tablet (40 mg total) by mouth daily.  Dispense: 90 tablet; Refill: 1  3. Essential hypertension  - losartan (COZAAR) 100 MG tablet; Take 1 tablet (100 mg total) by mouth daily.  Dispense: 90 tablet; Refill: 1 - amLODipine (NORVASC) 5 MG tablet; Take 1 tablet (5 mg total) by mouth daily.  Dispense: 90 tablet; Refill: 1  Mardelle Pandolfi Asad A. [facility] 05/09/2017 9:20 AM

## 2017-05-10 LAB — MICROALBUMIN / CREATININE URINE RATIO
Creatinine, Urine: 194 mg/dL (ref 20–320)
MICROALB/CREAT RATIO: 25 ug/mg{creat} (ref <30)
Microalb, Ur: 4.9 mg/dL

## 2017-05-10 LAB — LIPID PANEL
Cholesterol: 161 mg/dL (ref <200)
HDL: 79 mg/dL (ref >50)
LDL CALC: 63 mg/dL (ref <100)
Total CHOL/HDL Ratio: 2 Ratio (ref <5.0)
Triglycerides: 94 mg/dL (ref <150)
VLDL: 19 mg/dL (ref <30)

## 2017-05-10 LAB — COMPLETE METABOLIC PANEL WITH GFR
ALBUMIN: 3.8 g/dL (ref 3.6–5.1)
ALK PHOS: 48 U/L (ref 33–130)
ALT: 12 U/L (ref 6–29)
AST: 14 U/L (ref 10–35)
BILIRUBIN TOTAL: 0.6 mg/dL (ref 0.2–1.2)
BUN: 19 mg/dL (ref 7–25)
CO2: 30 mmol/L (ref 20–31)
CREATININE: 0.9 mg/dL (ref 0.60–0.93)
Calcium: 8.8 mg/dL (ref 8.6–10.4)
Chloride: 102 mmol/L (ref 98–110)
GFR, Est African American: 72 mL/min (ref >=60)
GFR, Est Non African American: 62 mL/min (ref >=60)
GLUCOSE: 201 mg/dL — AB (ref 65–99)
Potassium: 3.7 mmol/L (ref 3.5–5.3)
SODIUM: 144 mmol/L (ref 135–146)
TOTAL PROTEIN: 6.4 g/dL (ref 6.1–8.1)

## 2017-05-24 ENCOUNTER — Telehealth: Payer: Self-pay | Admitting: Family Medicine

## 2017-05-24 NOTE — Telephone Encounter (Signed)
Requesting return call, checking on test results [id number]

## 2017-05-25 NOTE — Telephone Encounter (Signed)
Patient has been notified of lab results  

## 2017-05-26 ENCOUNTER — Emergency Department: Payer: Medicare Other

## 2017-05-26 ENCOUNTER — Emergency Department
Admission: EM | Admit: 2017-05-26 | Discharge: 2017-05-26 | Disposition: A | Discharge status: Home / Self Care | Payer: Medicare Other | Attending: Emergency Medicine | Admitting: Emergency Medicine

## 2017-05-26 ENCOUNTER — Encounter: Payer: Self-pay | Admitting: Emergency Medicine

## 2017-05-26 DIAGNOSIS — E119 Type 2 diabetes mellitus without complications: Secondary | ICD-10-CM | POA: Diagnosis not present

## 2017-05-26 DIAGNOSIS — W19XXXA Unspecified fall, initial encounter: Secondary | ICD-10-CM

## 2017-05-26 DIAGNOSIS — Y9389 Activity, other specified: Secondary | ICD-10-CM | POA: Insufficient documentation

## 2017-05-26 DIAGNOSIS — Z7902 Long term (current) use of antithrombotics/antiplatelets: Secondary | ICD-10-CM | POA: Insufficient documentation

## 2017-05-26 DIAGNOSIS — Y999 Unspecified external cause status: Secondary | ICD-10-CM | POA: Insufficient documentation

## 2017-05-26 DIAGNOSIS — M25519 Pain in unspecified shoulder: Secondary | ICD-10-CM

## 2017-05-26 DIAGNOSIS — S8012XA Contusion of left lower leg, initial encounter: Secondary | ICD-10-CM

## 2017-05-26 DIAGNOSIS — Z794 Long term (current) use of insulin: Secondary | ICD-10-CM | POA: Insufficient documentation

## 2017-05-26 DIAGNOSIS — S0990XA Unspecified injury of head, initial encounter: Secondary | ICD-10-CM

## 2017-05-26 DIAGNOSIS — J449 Chronic obstructive pulmonary disease, unspecified: Secondary | ICD-10-CM | POA: Insufficient documentation

## 2017-05-26 DIAGNOSIS — Y92009 Unspecified place in unspecified non-institutional (private) residence as the place of occurrence of the external cause: Secondary | ICD-10-CM

## 2017-05-26 DIAGNOSIS — W06XXXA Fall from bed, initial encounter: Secondary | ICD-10-CM | POA: Insufficient documentation

## 2017-05-26 DIAGNOSIS — I10 Essential (primary) hypertension: Secondary | ICD-10-CM | POA: Diagnosis not present

## 2017-05-26 DIAGNOSIS — M25512 Pain in left shoulder: Secondary | ICD-10-CM | POA: Diagnosis not present

## 2017-05-26 DIAGNOSIS — Y92003 Bedroom of unspecified non-institutional (private) residence as the place of occurrence of the external cause: Secondary | ICD-10-CM | POA: Diagnosis not present

## 2017-05-26 DIAGNOSIS — J45909 Unspecified asthma, uncomplicated: Secondary | ICD-10-CM | POA: Insufficient documentation

## 2017-05-26 MED ORDER — ACETAMINOPHEN 325 MG PO TABS
650.0000 mg | ORAL_TABLET | Freq: Once | ORAL | Status: AC
  Administered 2017-05-26: 650 mg via ORAL
  Filled 2017-05-26: qty 2

## 2017-05-26 MED ORDER — IPRATROPIUM-ALBUTEROL 0.5-2.5 (3) MG/3ML IN SOLN
RESPIRATORY_TRACT | Status: AC
  Administered 2017-05-26: 3 mL via RESPIRATORY_TRACT
  Filled 2017-05-26: qty 3

## 2017-05-26 MED ORDER — IPRATROPIUM-ALBUTEROL 0.5-2.5 (3) MG/3ML IN SOLN
3.0000 mL | Freq: Once | RESPIRATORY_TRACT | Status: AC
  Administered 2017-05-26: 3 mL via RESPIRATORY_TRACT

## 2017-05-26 NOTE — ED Provider Notes (Signed)
[facility] Emergency Department Provider Note   ____________________________________________   First MD Initiated Contact with Patient 05/26/17 [phone]     (approximate)  I have reviewed the triage vital signs and the nursing notes.   HISTORY  Chief Complaint Fall    HPI Karen [name] is a 76 y.o. female who comes into the hospital today after falling out of bed. The patient was sleeping and rolled out of bed. She woke up when she hit the floor. She has some pain to her left shoulder head and left leg. The patient wears oxygen all of the time. She is on Plavix which is why she was sent in for evaluation. The patient has lots of bruises and she reports that she never falls out of bed. She rates her pain a 10 out of 10 in intensity. The patient is here today for evaluation.The patient did not take anything for pain prior to coming in.   Past Medical History:  Diagnosis Date  . COPD (chronic obstructive pulmonary disease) (HCC)   . Diabetes mellitus without complication (HCC)   . Hyperlipidemia   . Hypertension     Patient Active Problem List   Diagnosis Date Noted  . COPD (chronic obstructive pulmonary disease) (HCC) 03/28/2017  . Swelling of limb 09/27/2016  . Lower limb ulcer, calf, left, limited to breakdown of skin (HCC) 09/27/2016  . Encounter related to need for assistance with personal care 04/13/2016  . Personal history of other diseases of the circulatory system 11/09/2015  . Easy bruising 10/20/2015  . COPD exacerbation (HCC) 10/19/2015  . Atrial fibrillation (HCC) 10/19/2015  . Diabetes mellitus (HCC) 10/19/2015  . 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 Proteinuria due to type 2 diabetes mellitus (HCC) 06/26/2015  . Morbid obesity due to excess calories (HCC) 06/26/2015  . Obesity, Class II, BMI 35-39.9 06/26/2015  . On continuous oral anticoagulation 06/26/2015  . Asthma with COPD (HCC) 06/26/2015  .  Hyperlipidemia 06/26/2015  . Obstructive sleep apnea 06/26/2015  . CAFL (chronic airflow limitation) (HCC) 06/01/2015  . A-fib (HCC) 06/01/2015  . Diabetes (HCC) 06/01/2015  . History of prolonged Q-T interval on ECG 06/01/2015  . HLD (hyperlipidemia) 06/01/2015  . BP (high blood pressure) 06/01/2015  . Obstructive apnea 06/01/2015    Past Surgical History:  Procedure Laterality Date  . CHOLECYSTECTOMY  1993    Prior to Admission medications   Medication Sig Start Date End Date Taking? Authorizing Provider  ALPRAZolam (XANAX) 0.25 MG tablet Take 1 tablet (0.25 mg total) by mouth 2 (two) times daily as needed for anxiety. 03/30/17   Altamese Dilling, MD  amLODipine (NORVASC) 5 MG tablet Take 1 tablet (5 mg total) by mouth daily. 05/09/17   Ellyn Hack, MD  B-D INS SYRINGE 0.5CC/31GX5/16 31G X 5/16" 0.5 ML MISC USE TO INJECT INSULIN TWICE DAILY. 05/09/17   Ellyn Hack, MD  clopidogrel (PLAVIX) 75 MG tablet Take 1 tablet (75 mg total) by mouth daily. 08/01/16   Ellyn Hack, MD  digoxin (LANOXIN) 0.125 MG tablet Take 0.125 mg by mouth every other day.     [provider]  diphenhydrAMINE (BENADRYL) 25 mg capsule Take 1 capsule (25 mg total) by mouth 2 (two) times daily as needed for itching or allergies. 03/30/17   Altamese Dilling, MD  ferrous sulfate 325 (65 FE) MG EC tablet Take 325 mg by mouth every other day.     [provider]  furosemide (LASIX) 80 MG tablet Take 80 mg by mouth daily.     [provider]  insulin lispro (HUMALOG KWIKPEN) 100 UNIT/ML KiwkPen Use as directed according to Sliding Scale. 05/09/17   Ellyn Hack, MD  insulin NPH-regular Human (HUMULIN 70/30) (70-30) 100 UNIT/ML injection INJECT 50 UNITS SUBCUTANEOUSLY ONCE IN THE MORNING AND 27 UNITS EVERYEVENING 05/09/17   Ellyn Hack, MD  Insulin Pen Needle (UNIFINE PENTIPS) 31G X 6 MM MISC Use as directed to inject Insulin. 08/01/16   Ellyn Hack, MD    ipratropium-albuterol (DUONEB) 0.5-2.5 (3) MG/3ML SOLN USE 1 VIAL VIA NEBULIZER EVERY 6 HOURS AS NEEDED 03/21/17   Ellyn Hack, MD  levothyroxine (SYNTHROID, LEVOTHROID) 150 MCG tablet TAKE 1 TABLET BY MOUTH ONCE DAILY ON AN EMPTY STOMACH. WAIT 30 MINUTES BEFORE TAKING OTHER MEDS. 03/21/17   Ellyn Hack, MD  losartan (COZAAR) 100 MG tablet Take 1 tablet (100 mg total) by mouth daily. 05/09/17   Ellyn Hack, MD  pantoprazole (PROTONIX) 40 MG tablet TAKE 1 TABLET BY MOUTH ONCE DAILY 04/18/17   Ellyn Hack, MD  pravastatin (PRAVACHOL) 40 MG tablet Take 1 tablet (40 mg total) by mouth daily. 05/09/17   Ellyn Hack, MD  predniSONE (DELTASONE) 10 MG tablet Take 1 tablet by mouth daily. 04/08/17   [provider]  Steffanie Rainwater 180 MCG/ACT inhaler  06/06/16   [provider]  raloxifene (EVISTA) 60 MG tablet Take 1 tablet (60 mg total) by mouth daily. 08/01/16   Ellyn Hack, MD  VENTOLIN HFA 108 [phone] BASE) MCG/ACT inhaler  05/08/15   [provider]  vitamin E 400 UNIT capsule Take 400 Units by mouth daily.    [provider]    Allergies Ace inhibitors  Family History  Problem Relation Age of Onset  . Heart disease Mother   . Heart disease Father   . Heart disease Sister   . Dementia Sister   . Heart disease Brother   . Diabetes Paternal Grandmother     Social History Social History  Substance Use Topics  . Smoking status: Never Smoker  . Smokeless tobacco: Never Used  . Alcohol use No    Review of Systems  Constitutional: No fever/chills Eyes: No visual changes. ENT: No sore throat. Cardiovascular: Denies chest pain. Respiratory: Denies shortness of breath. Gastrointestinal: No abdominal pain.  No nausea, no vomiting.  No diarrhea.  No constipation. Genitourinary: Negative for dysuria. Musculoskeletal: Negative for back pain. Skin: Contusions Neurological: Negative for headaches, focal weakness or  numbness.   ____________________________________________   PHYSICAL EXAM:  VITAL SIGNS: ED Triage Vitals  Enc Vitals Group     BP 05/26/17 0315 (!) 180/79     Pulse Rate 05/26/17 0315 91     Resp 05/26/17 0315 18     Temp 05/26/17 0315 98.1 F (36.7 C)     Temp Source 05/26/17 0315 Oral     SpO2 05/26/17 0315 98 %     Weight 05/26/17 0315 185 lb (83.9 kg)     Height 05/26/17 0315 4\' 11"  (1.499 m)     Head Circumference --      Peak Flow --      Pain Score 05/26/17 0314 10     Pain Loc --      Pain Edu? --      Excl. in GC? --  Constitutional: Alert and oriented. Well appearing and in Mild distress. Eyes: Conjunctivae are normal. PERRL. EOMI. Head: Small contusion to the left parietal scalp Nose: No congestion/rhinnorhea. Mouth/Throat: Mucous membranes are moist.  Oropharynx non-erythematous. Neck: Mild cervical spine tenderness to palpation. Cardiovascular: Normal rate, regular rhythm. Grossly normal heart sounds.  Good peripheral circulation. Respiratory: Normal respiratory effort.  No retractions. Lungs CTAB. Gastrointestinal: Soft and nontender. No distention. Positive bowel sounds Musculoskeletal: Some mild tenderness to palpation of left shoulder with pain with range of motion. Neurologic:  Normal speech and language.  Skin:  Skin is warm, dry and intact. Contusion to left lower leg below the knee Psychiatric: Mood and affect are normal.   ____________________________________________   LABS (all labs ordered are listed, but only abnormal results are displayed)  Labs Reviewed - No data to display ____________________________________________  EKG  none ____________________________________________  RADIOLOGY  Dg Tibia/fibula Left  Result Date: 05/26/2017 CLINICAL DATA:  Left leg pain after fall. EXAM: LEFT TIBIA AND FIBULA - 2 VIEW COMPARISON:  None. FINDINGS: There is no evidence of fracture or other focal bone lesions. Mild degenerative change at the  knee. There is diffuse soft tissue edema. Vascular calcifications. IMPRESSION: No fracture of the left tibia/ fibula. Diffuse soft tissue edema. Electronically Signed   By: Rubye Oaks M.D.   On: 05/26/2017 04:13   Ct Head Wo Contrast  Result Date: 05/26/2017 CLINICAL DATA:  Initial evaluation for acute trauma, fall. EXAM: CT HEAD WITHOUT CONTRAST CT CERVICAL SPINE WITHOUT CONTRAST TECHNIQUE: Multidetector CT imaging of the head and cervical spine was performed following the standard protocol without intravenous contrast. Multiplanar CT image reconstructions of the cervical spine were also generated. COMPARISON:  Prior CT from 11/29/2010. FINDINGS: CT HEAD FINDINGS Brain: Mild cerebral and cerebellar atrophy. Mild chronic small vessel ischemic disease. No acute intracranial hemorrhage. No acute large vessel territory infarct. No mass lesion, midline shift or mass effect. No hydrocephalus. No extra-axial fluid collection. Vascular: No hyperdense vessel. Scattered vascular calcifications noted within the carotid siphons. Skull: Soft tissue contusion present at the left parietal scalp. Additional small contusion at the left forehead. Calvarium intact. Sinuses/Orbits: Globes and orbital soft tissues within normal limits. Patient status post lens extraction on the right. Moderate mucosal thickening within the visualized ethmoidal air cells. Paranasal sinuses otherwise clear. No mastoid effusion. The CT CERVICAL SPINE FINDINGS Alignment: Vertebral bodies normally aligned with preservation of the normal cervical lordosis. No listhesis. Skull base and vertebrae: Skullbase intact. Normal C1-2 articulations preserved. Dens intact. Vertebral body heights maintained. No acute fracture. Soft tissues and spinal canal: Soft tissues of the neck demonstrate no acute abnormality. No prevertebral edema. Spinal canal within normal limits. Vascular calcifications present about the carotid bifurcations. Disc levels: No  significant degenerative changes within the cervical spine. Upper chest: Visualized upper chest within normal limits. Visualized lung apices are clear. No apical pneumothorax. The Other: IMPRESSION: CT BRAIN: 1. No acute intracranial process. 2. Soft tissue contusions at the left forehead and left parietal scalp. No skull fracture. 3. Mild atrophy with chronic small vessel ischemic disease. 4. Moderate ethmoidal sinus disease. CT CERVICAL SPINE: No acute traumatic injury within the cervical spine. Electronically Signed   By: Rise Mu M.D.   On: 05/26/2017 04:20   Ct Cervical Spine Wo Contrast  Result Date: 05/26/2017 CLINICAL DATA:  Initial evaluation for acute trauma, fall. EXAM: CT HEAD WITHOUT CONTRAST CT CERVICAL SPINE WITHOUT CONTRAST TECHNIQUE: Multidetector CT imaging of the head and cervical spine was performed following  the standard protocol without intravenous contrast. Multiplanar CT image reconstructions of the cervical spine were also generated. COMPARISON:  Prior CT from 11/29/2010. FINDINGS: CT HEAD FINDINGS Brain: Mild cerebral and cerebellar atrophy. Mild chronic small vessel ischemic disease. No acute intracranial hemorrhage. No acute large vessel territory infarct. No mass lesion, midline shift or mass effect. No hydrocephalus. No extra-axial fluid collection. Vascular: No hyperdense vessel. Scattered vascular calcifications noted within the carotid siphons. Skull: Soft tissue contusion present at the left parietal scalp. Additional small contusion at the left forehead. Calvarium intact. Sinuses/Orbits: Globes and orbital soft tissues within normal limits. Patient status post lens extraction on the right. Moderate mucosal thickening within the visualized ethmoidal air cells. Paranasal sinuses otherwise clear. No mastoid effusion. The CT CERVICAL SPINE FINDINGS Alignment: Vertebral bodies normally aligned with preservation of the normal cervical lordosis. No listhesis. Skull base  and vertebrae: Skullbase intact. Normal C1-2 articulations preserved. Dens intact. Vertebral body heights maintained. No acute fracture. Soft tissues and spinal canal: Soft tissues of the neck demonstrate no acute abnormality. No prevertebral edema. Spinal canal within normal limits. Vascular calcifications present about the carotid bifurcations. Disc levels: No significant degenerative changes within the cervical spine. Upper chest: Visualized upper chest within normal limits. Visualized lung apices are clear. No apical pneumothorax. The Other: IMPRESSION: CT BRAIN: 1. No acute intracranial process. 2. Soft tissue contusions at the left forehead and left parietal scalp. No skull fracture. 3. Mild atrophy with chronic small vessel ischemic disease. 4. Moderate ethmoidal sinus disease. CT CERVICAL SPINE: No acute traumatic injury within the cervical spine. Electronically Signed   By: Rise Mu M.D.   On: 05/26/2017 04:20   Dg Shoulder Left  Result Date: 05/26/2017 CLINICAL DATA:  Left shoulder pain after fall. EXAM: LEFT SHOULDER - 2+ VIEW COMPARISON:  None. FINDINGS: There is no evidence of fracture or dislocation. Mild glenohumeral and acromioclavicular osteoarthritis. Soft tissues are unremarkable. IMPRESSION: No fracture or dislocation of the left shoulder. Electronically Signed   By: Rubye Oaks M.D.   On: 05/26/2017 04:12    ____________________________________________   PROCEDURES  Procedure(s) performed: None  Procedures  Critical Care performed: No  ____________________________________________   INITIAL IMPRESSION / ASSESSMENT AND PLAN / ED COURSE  Pertinent labs & imaging results that were available during my care of the patient were reviewed by me and considered in my medical decision making (see chart for details).  This is a 76 year old female who comes into the hospital today after falling out of bed. The patient is on Plavix. I will send the patient for CT scan  of her head and cervical spine as well as a left shoulder and left tib-fib x-ray.  Clinical Course as of May 26 599  Fri May 26, 2017  0436 CT BRAIN:  1. No acute intracranial process. 2. Soft tissue contusions at the left forehead and left parietal scalp. No skull fracture. 3. Mild atrophy with chronic small vessel ischemic disease. 4. Moderate ethmoidal sinus disease.  CT CERVICAL SPINE:  No acute traumatic injury within the cervical spine.   CT Head Wo Contrast [AW]  0437 No fracture of the left tibia/ fibula.  Diffuse soft tissue edema.   DG Tibia/Fibula Left [AW]  0437 No fracture or dislocation of the left shoulder. DG Shoulder Left [AW]    Clinical Course User Index [AW] Rebecka Apley, MD   The patient's imaging studies are unremarkable. I did give the patient a DuoNeb and she was wheezing and that was  resolved. I will also give the patient some Tylenol. She should follow-up with her primary care physician for further evaluation of her symptoms. The patient be discharged home.  ____________________________________________   FINAL CLINICAL IMPRESSION(S) / ED DIAGNOSES  Final diagnoses:  Shoulder pain  Contusion of left lower leg, initial encounter  Injury of head, initial encounter  Fall in home, initial encounter      NEW MEDICATIONS STARTED DURING THIS VISIT:  Discharge Medication List as of 05/26/2017  4:51 AM       Note:  This document was prepared using Dragon voice recognition software and may include unintentional dictation errors.    Rebecka ApleyWebster, Chinwe Lope P, MD 05/26/17 0600

## 2017-05-26 NOTE — ED Triage Notes (Signed)
Pt arrived via ems from home. Pt was woken up from her sleep by falling on the floor. Pt denies any other recent falls and reports pain is the worst in her left shoulder and left leg below the knee. Pt is alert and oriented and in NAD at this time.

## 2017-06-05 ENCOUNTER — Ambulatory Visit (INDEPENDENT_AMBULATORY_CARE_PROVIDER_SITE_OTHER): Payer: Medicare Other | Admitting: Podiatry

## 2017-06-05 ENCOUNTER — Encounter: Payer: Self-pay | Admitting: Podiatry

## 2017-06-05 DIAGNOSIS — B351 Tinea unguium: Secondary | ICD-10-CM

## 2017-06-05 DIAGNOSIS — M79676 Pain in unspecified toe(s): Secondary | ICD-10-CM | POA: Diagnosis not present

## 2017-06-05 DIAGNOSIS — E1142 Type 2 diabetes mellitus with diabetic polyneuropathy: Secondary | ICD-10-CM

## 2017-06-05 NOTE — Progress Notes (Signed)
She presents today with her son for chief complaint of painful elongated toenails.  Objective: Vital signs are stable pulses are barely palpable bilateral capillary fill time is immediate. Swelling to the left leg secondary to injury from a fall. No open lesions or wounds. 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  Assessment: Pain in limb secondary to onychomycosis.  Plan: Debridement of toenails 1 through 5 bilateral.

## 2017-06-22 ENCOUNTER — Telehealth: Payer: Self-pay | Admitting: Family Medicine

## 2017-06-22 NOTE — Telephone Encounter (Signed)
[name] states pt has some wheezing and is not feeling that well. Looks a little pale. Vitals are good.  Would like to know if you would like to increase or have patient take an extra dose of lasix [phone]

## 2017-06-23 NOTE — Telephone Encounter (Signed)
Spoke with Karen [name] and explained that patient is followed by cardiology for congestive heart failure and that she should contact her cardiologist regarding additional dose of furosemide, as far as wheezing is concerned, I will be glad to see her next week. Karen [name] verbalized understanding and will contact cardiologist and will ask patient to schedule a follow-up if necessary

## 2017-06-23 NOTE — Telephone Encounter (Signed)
Dr. Ledell NossShah, [name] states pt has some wheezing and is not feeling that well. Looks a little pale. Vitals are good.  Would like to know if you would like to increase or have patient take an extra dose of lasix [phone]

## 2017-07-10 ENCOUNTER — Ambulatory Visit: Payer: Medicare Other

## 2017-07-17 ENCOUNTER — Other Ambulatory Visit: Payer: Self-pay | Admitting: Family Medicine

## 2017-07-21 ENCOUNTER — Other Ambulatory Visit: Payer: Self-pay | Admitting: Family Medicine

## 2017-07-21 DIAGNOSIS — K219 Gastro-esophageal reflux disease without esophagitis: Secondary | ICD-10-CM

## 2017-07-21 MED ORDER — PANTOPRAZOLE SODIUM 40 MG PO TBEC: 40.0000 mg | DELAYED_RELEASE_TABLET | Freq: Every day | ORAL | 0 refills | Status: DC

## 2017-07-27 ENCOUNTER — Other Ambulatory Visit: Payer: Self-pay | Admitting: Family Medicine

## 2017-07-27 DIAGNOSIS — K219 Gastro-esophageal reflux disease without esophagitis: Secondary | ICD-10-CM

## 2017-08-01 ENCOUNTER — Telehealth: Payer: Self-pay | Admitting: Family Medicine

## 2017-08-01 NOTE — Telephone Encounter (Signed)
Christy Reel from [facility] requesting verbal order to recert the patient.  (P) [phone]

## 2017-08-02 NOTE — Telephone Encounter (Signed)
Confirmed verbal order to recert patient per Dr. Sherryll BurgerShah [facility]

## 2017-08-09 ENCOUNTER — Other Ambulatory Visit: Payer: Self-pay | Admitting: Family Medicine

## 2017-08-09 DIAGNOSIS — I10 Essential (primary) hypertension: Secondary | ICD-10-CM

## 2017-08-16 ENCOUNTER — Other Ambulatory Visit: Payer: Self-pay | Admitting: Family Medicine

## 2017-08-17 ENCOUNTER — Other Ambulatory Visit: Payer: Self-pay | Admitting: Family Medicine

## 2017-08-22 ENCOUNTER — Other Ambulatory Visit: Payer: Self-pay | Admitting: Family Medicine

## 2017-08-22 ENCOUNTER — Telehealth: Payer: Self-pay | Admitting: Family Medicine

## 2017-08-22 DIAGNOSIS — E1121 Type 2 diabetes mellitus with diabetic nephropathy: Secondary | ICD-10-CM

## 2017-08-22 DIAGNOSIS — M810 Age-related osteoporosis without current pathological fracture: Secondary | ICD-10-CM

## 2017-08-22 DIAGNOSIS — IMO0002 Reserved for concepts with insufficient information to code with codable children: Secondary | ICD-10-CM

## 2017-08-22 DIAGNOSIS — E1165 Type 2 diabetes mellitus with hyperglycemia: Principal | ICD-10-CM

## 2017-08-22 MED ORDER — RALOXIFENE HCL 60 MG PO TABS: 60.0000 mg | ORAL_TABLET | Freq: Every day | ORAL | 0 refills | Status: DC

## 2017-08-22 NOTE — Telephone Encounter (Signed)
HAVE SET PATIENT UP FOR A CPE WITH YOU ( OCT. 8) FOR SHE HAS AN ADVANTAGE PLAN TO WHERE SHE CAN HAVE A CPE WITH YOU AND THEY WANT IT ALL DONE IN ONE VISIT WITH YOU. DAUGHTER IN LAW IS ASKING IF YOU COULD GIVE HER ENOUGH OF THE MEDICATION THAT SHE IS ALREADY TAKING ( RALOXIFENE)  TO CALM HER DOWN TILL SHE COMES IN FOR A CPE ON OCT 8TH. THAT IS YOUR FIRST OPEN CPE SLOT FOR AN APPT. Please consider for they said that she has a hard time breathing.

## 2017-08-22 NOTE — Telephone Encounter (Signed)
I spoke with patient reports that she has history of osteoporosis, she does not know which bone is affected. She has been on raloxifene for many years, started by her previous PCP. No recent DEXA scan on file. I will refill raloxifene for 30 days and recommend that she must have a DEXA scan to evaluate for osteoporosis, she will schedule an appointment for physical.

## 2017-08-22 NOTE — Telephone Encounter (Signed)
Doxycycline as prescribed either for the prevention or treatment of postmenopausal osteoporosis. Patient has no recent DEXA scan on file. She must schedule an appointment for a complete physical exam where we'll order a DEXA scan for evaluation.

## 2017-08-22 NOTE — Telephone Encounter (Signed)
PT IS NEEDING REFILLS ON HER RALOXIFENE. THE PHARM SAYS THAT THEY HAVE SENT IN 3 REQUEST BESIDES THIS ONE AND HAVE NOT HEARD ANYTHING BACK.Marland Kitchen PATIENT IS OUT OF THIS MEDICATION AND IS VERY UPSET PER PHARMIST. PHARM IS [facility].

## 2017-08-23 NOTE — Telephone Encounter (Signed)
PT NOTIFIED  

## 2017-09-11 ENCOUNTER — Encounter: Payer: Self-pay | Admitting: Family Medicine

## 2017-09-11 ENCOUNTER — Ambulatory Visit (INDEPENDENT_AMBULATORY_CARE_PROVIDER_SITE_OTHER): Payer: Medicare Other | Admitting: Family Medicine

## 2017-09-11 VITALS — BP 128/76 | HR 102 | Ht <= 58 in | Wt 179.6 lb

## 2017-09-11 DIAGNOSIS — Z0000 Encounter for general adult medical examination without abnormal findings: Secondary | ICD-10-CM | POA: Diagnosis not present

## 2017-09-11 DIAGNOSIS — E118 Type 2 diabetes mellitus with unspecified complications: Secondary | ICD-10-CM

## 2017-09-11 DIAGNOSIS — M810 Age-related osteoporosis without current pathological fracture: Secondary | ICD-10-CM | POA: Diagnosis not present

## 2017-09-11 DIAGNOSIS — Z23 Encounter for immunization: Secondary | ICD-10-CM

## 2017-09-11 DIAGNOSIS — Z794 Long term (current) use of insulin: Secondary | ICD-10-CM

## 2017-09-11 LAB — POCT GLYCOSYLATED HEMOGLOBIN (HGB A1C): Hemoglobin A1C: 9.2

## 2017-09-11 NOTE — Progress Notes (Signed)
Name: Karen [name]   MRN: [id number]    DOB: 07/11/1941   Date:09/11/2017       Progress Note  Subjective  Chief Complaint  Chief Complaint  Patient presents with  . Medication Refill    All meds  . Diabetes  . Hyperlipidemia  . Hypertension  . Immunizations    Flu vaccine today     HPI  Pt. Presents for complete physical exam.  She is due for DEXA scan, has history of Osteoporosis and is currently taking Evista 60 mg daily.  She is due for mammogram, no record of any recent mammogram in [facility]. Last known mammogram in 2006.    Past Medical History:  Diagnosis Date  . COPD (chronic obstructive pulmonary disease) (HCC)   . Diabetes mellitus without complication (HCC)   . Hyperlipidemia   . Hypertension     Past Surgical History:  Procedure Laterality Date  . CHOLECYSTECTOMY  1993    Family History  Problem Relation Age of Onset  . Heart disease Mother   . Heart disease Father   . Heart disease Sister   . Dementia Sister   . Heart disease Brother   . Diabetes Paternal Grandmother     Social History   Social History  . Marital status: Single    Spouse name: N/A  . Number of children: N/A  . Years of education: N/A   Occupational History  . Not on file.   Social History Main Topics  . Smoking status: Never Smoker  . Smokeless tobacco: Never Used  . Alcohol use No  . Drug use: No  . Sexual activity: No   Other Topics Concern  . Not on file   Social History Narrative  . No narrative on file     Current Outpatient Prescriptions:  .  ALPRAZolam (XANAX) 0.25 MG tablet, Take 1 tablet (0.25 mg total) by mouth 2 (two) times daily as needed for anxiety., Disp: 20 tablet, Rfl: 0 .  amLODipine (NORVASC) 5 MG tablet, TAKE 1 TABLET BY MOUTH ONCE DAILY, Disp: 90 tablet, Rfl: 0 .  B-D INS SYRINGE 0.5CC/31GX5/16 31G X 5/16" 0.5 ML MISC, USE TO INJECT INSULIN TWICE DAILY., Disp: 100 each, Rfl: 6 .  clopidogrel (PLAVIX) 75 MG tablet, TAKE 1 TABLET BY MOUTH ONCE  DAILY, Disp: 90 tablet, Rfl: 3 .  digoxin (LANOXIN) 0.125 MG tablet, Take 0.125 mg by mouth every other day. , Disp: , Rfl:  .  diphenhydrAMINE (BENADRYL) 25 mg capsule, Take 1 capsule (25 mg total) by mouth 2 (two) times daily as needed for itching or allergies., Disp: 10 capsule, Rfl: 0 .  ferrous sulfate 325 (65 FE) MG EC tablet, Take 325 mg by mouth every other day. , Disp: , Rfl:  .  furosemide (LASIX) 40 MG tablet, , Disp: , Rfl:  .  HUMULIN 70/30 (70-30) 100 UNIT/ML injection, INJECT 50 UNITS SUBQ ONCE EVERY MORNING AND 27 UNITS ONCE EVERY EVENING, Disp: 20 mL, Rfl: 2 .  insulin lispro (HUMALOG KWIKPEN) 100 UNIT/ML KiwkPen, Use as directed according to Sliding Scale., Disp: 15 mL, Rfl: 2 .  insulin NPH-regular Human (HUMULIN 70/30) (70-30) 100 UNIT/ML injection, INJECT 50 UNITS SUBCUTANEOUSLY ONCE IN THE MORNING AND 27 UNITS EVERYEVENING, Disp: 20 mL, Rfl: 2 .  Insulin Pen Needle (UNIFINE PENTIPS) 31G X 6 MM MISC, Use as directed to inject Insulin., Disp: 90 each, Rfl: 3 .  ipratropium-albuterol (DUONEB) 0.5-2.5 (3) MG/3ML SOLN, USE 1 VIAL VIA NEBULIZER EVERY 6 HOURS  AS NEEDED, Disp: 360 mL, Rfl: 2 .  levothyroxine (SYNTHROID, LEVOTHROID) 150 MCG tablet, TAKE 1 TABLET BY MOUTH ONCE DAILY ON AN EMPTY STOMACH. WAIT 30 MINUTES BEFORE TAKING OTHER MEDS., Disp: 90 tablet, Rfl: 1 .  losartan (COZAAR) 100 MG tablet, Take 1 tablet (100 mg total) by mouth daily., Disp: 90 tablet, Rfl: 1 .  pantoprazole (PROTONIX) 40 MG tablet, TAKE 1 TABLET BY MOUTH ONCE DAILY, Disp: 90 tablet, Rfl: 0 .  pravastatin (PRAVACHOL) 40 MG tablet, Take 1 tablet (40 mg total) by mouth daily., Disp: 90 tablet, Rfl: 1 .  predniSONE (DELTASONE) 10 MG tablet, Take 1 tablet by mouth daily., Disp: , Rfl:  .  PULMICORT FLEXHALER 180 MCG/ACT inhaler, , Disp: , Rfl:  .  raloxifene (EVISTA) 60 MG tablet, Take 1 tablet (60 mg total) by mouth daily., Disp: 30 tablet, Rfl: 0 .  VENTOLIN HFA 108 (90 BASE) MCG/ACT inhaler, , Disp: , Rfl:   .  vitamin E 400 UNIT capsule, Take 400 Units by mouth daily., Disp: , Rfl:   Allergies  Allergen Reactions  . Ace Inhibitors Other (See Comments)    Cough      Review of Systems  Constitutional: Negative for chills, fever, malaise/fatigue and weight loss.  HENT: Negative for congestion, ear pain, sinus pain and sore throat.   Eyes: Negative for blurred vision and double vision.  Respiratory: Positive for cough, sputum production, shortness of breath and wheezing.   Cardiovascular: Positive for chest pain (intermittent chest pain with breathing) and leg swelling. Negative for palpitations.  Gastrointestinal: Positive for diarrhea (recent non bloody diarrhea.). Negative for abdominal pain, constipation, nausea and vomiting.  Genitourinary: Negative for dysuria, frequency and hematuria.  Musculoskeletal: Negative for back pain and neck pain.  Skin: Negative for itching (occasional itching on left leg becasue of dry skin) and rash.  Neurological: Negative for dizziness and headaches.  Psychiatric/Behavioral: Negative for depression. The patient is not nervous/anxious and does not have insomnia.       Objective  Vitals:   09/11/17 1505  BP: 128/76  Pulse: (!) 102  SpO2: 93%  Weight: 179 lb 9.6 oz (81.5 kg)  Height:  (1.448 m)    Physical Exam  Constitutional: She is oriented to person, place, and time and well-developed, well-nourished, and in no distress. She appears to not be writhing in pain and not jaundiced. She appears not cachectic.  Non-toxic appearance. No distress.  HENT:  Head: Normocephalic and atraumatic.  Mouth/Throat: No posterior oropharyngeal erythema.  TM not visualized due to cerumen in the canal  Eyes: Conjunctivae are normal.  Neck: Neck supple.  Cardiovascular: Regular rhythm, S1 normal, S2 normal and normal heart sounds.  Tachycardia present.   No murmur heard. Pulmonary/Chest: She is in respiratory distress. She has decreased breath sounds.  She has wheezes in the right upper field, the right middle field, the right lower field, the left upper field, the left middle field and the left lower field.  Abdominal: Soft. There is no tenderness.  Neurological: She is oriented to person, place, and time.  Skin: Skin is warm, dry and intact.  Small (1cm) round shaped healing ulcer on the right posterior lower leg, surrounding erythema, mild tenderness to palpation along the right lower extremity.   Psychiatric: Mood, memory, affect and judgment normal.  Nursing note and vitals reviewed.     Recent Results (from the past 2160 hour(s))  POCT glycosylated hemoglobin (Hb A1C)     Status: Abnormal  Collection Time: 09/11/17  3:20 PM  Result Value Ref Range   Hemoglobin A1C 9.2      Assessment & Plan  1. Flu vaccine need  - Flu vaccine HIGH DOSE PF (Fluzone High dose)  2. Well woman exam without gynecological exam Plan age appropriate laboratory screening, patient does not worse to proceed with mammogram - CBC with Differential/Platelet - TSH - VITAMIN D 25 Hydroxy (Vit-D Deficiency, Fractures) - COMPLETE METABOLIC PANEL WITH GFR  3. Age-related osteoporosis without current pathological fracture Order bone density test, continue on Evista - DG Bone Density; Future  4. Type 2 diabetes mellitus with complication, with long-term current use of insulin (HCC) Left A1c is 9.2%, poorly controlled, worse with daily steroid treatment for COPD, have discussed adding on an oral regimen but patient does not want to change pharmacotherapy at this time. Advised to continue using Humalog sliding scale, follow-up in 3 months - POCT glycosylated hemoglobin (Hb A1C)   [facility] 09/11/2017 3:29 PM

## 2017-09-12 ENCOUNTER — Telehealth: Payer: Self-pay | Admitting: Family Medicine

## 2017-09-12 ENCOUNTER — Telehealth: Payer: Self-pay

## 2017-09-12 DIAGNOSIS — E559 Vitamin D deficiency, unspecified: Secondary | ICD-10-CM

## 2017-09-12 LAB — COMPLETE METABOLIC PANEL WITH GFR
AG Ratio: 1.7 (calc) (ref 1.0–2.5)
ALKALINE PHOSPHATASE (APISO): 57 U/L (ref 33–130)
ALT: 14 U/L (ref 6–29)
AST: 13 U/L (ref 10–35)
Albumin: 3.8 g/dL (ref 3.6–5.1)
BILIRUBIN TOTAL: 0.4 mg/dL (ref 0.2–1.2)
BUN/Creatinine Ratio: 21 (calc) (ref 6–22)
BUN: 23 mg/dL (ref 7–25)
CHLORIDE: 101 mmol/L (ref 98–110)
CO2: 31 mmol/L (ref 20–32)
CREATININE: 1.12 mg/dL — AB (ref 0.60–0.93)
Calcium: 8.8 mg/dL (ref 8.6–10.4)
GFR, Est African American: 55 mL/min/{1.73_m2} — ABNORMAL LOW (ref >=60)
GFR, Est Non African American: 48 mL/min/{1.73_m2} — ABNORMAL LOW (ref >=60)
GLUCOSE: 335 mg/dL — AB (ref 65–139)
Globulin: 2.3 g/dL (calc) (ref 1.9–3.7)
Potassium: 4.5 mmol/L (ref 3.5–5.3)
Sodium: 141 mmol/L (ref 135–146)
TOTAL PROTEIN: 6.1 g/dL (ref 6.1–8.1)

## 2017-09-12 LAB — CBC WITH DIFFERENTIAL/PLATELET
BASOS ABS: 74 {cells}/uL (ref 0–200)
BASOS PCT: 0.9 %
EOS ABS: 98 {cells}/uL (ref 15–500)
Eosinophils Relative: 1.2 %
HEMATOCRIT: 38.3 % (ref 35.0–45.0)
HEMOGLOBIN: 12.2 g/dL (ref 11.7–15.5)
LYMPHS ABS: 1033 {cells}/uL (ref 850–3900)
MCH: 28.4 pg (ref 27.0–33.0)
MCHC: 31.9 g/dL — AB (ref 32.0–36.0)
MCV: 89.1 fL (ref 80.0–100.0)
MONOS PCT: 4.3 %
MPV: 11.5 fL (ref 7.5–12.5)
NEUTROS ABS: 6642 {cells}/uL (ref 1500–7800)
Neutrophils Relative %: 81 %
Platelets: 177 10*3/uL (ref 140–400)
RBC: 4.3 10*6/uL (ref 3.80–5.10)
RDW: 12 % (ref 11.0–15.0)
Total Lymphocyte: 12.6 %
WBC: 8.2 10*3/uL (ref 3.8–10.8)
WBCMIX: 353 {cells}/uL (ref 200–950)

## 2017-09-12 LAB — VITAMIN D 25 HYDROXY (VIT D DEFICIENCY, FRACTURES): VIT D 25 HYDROXY: 8 ng/mL — AB (ref 30–100)

## 2017-09-12 LAB — TSH: TSH: 0.27 m[IU]/L — AB (ref 0.40–4.50)

## 2017-09-12 MED ORDER — VITAMIN D (ERGOCALCIFEROL) 1.25 MG (50000 UNIT) PO CAPS: 50000.0000 [IU] | ORAL_CAPSULE | ORAL | 0 refills | Status: DC

## 2017-09-12 NOTE — Telephone Encounter (Signed)
Pts daughter in law Karoline Caldwell would like a call back about her abnormal labs.

## 2017-09-12 NOTE — Telephone Encounter (Signed)
-----   Message from Ellyn Hack, MD sent at 09/12/2017  8:58 AM EDT ----- CBC: Normal white count, hemoglobin, hematocrit and platelets TSH: Below normal at 0.27, she should return for further testing including free T4 and T3 uptake Vitamin D is below normal at 8 ng per mL, she should be started on vitamin D3 50,000 units 1 capsule weekly 12 weeks, recheck levels at completion of treatment CMP: Elevated glucose at 335 mg/dL, elevated creatinine of 1.12 with a GFR of 48, normal electrolytes and liver enzymes

## 2017-09-12 NOTE — Telephone Encounter (Signed)
Called pt informed her of information below, scheduled pt for 09/20/17 for f/u of abnormal thyroid test.

## 2017-09-13 ENCOUNTER — Other Ambulatory Visit: Payer: Self-pay | Admitting: Family Medicine

## 2017-09-13 NOTE — Telephone Encounter (Signed)
Called pt's daughter in law Marylene Land, explained recent lab results to her. She gave verbal understanding.

## 2017-09-18 ENCOUNTER — Telehealth: Payer: Self-pay | Admitting: Family Medicine

## 2017-09-18 NOTE — Telephone Encounter (Signed)
Pt wants to know if a referral for a Bone Density to be scheduled for 09/20/17

## 2017-09-19 ENCOUNTER — Telehealth: Payer: Self-pay | Admitting: Family Medicine

## 2017-09-19 NOTE — Telephone Encounter (Signed)
PT DAUGHTER SAYS THAT HER MOTHER WAS TOLD THAT SHE HAD AN APPT FOR BLOOD WORK BUT I TOLD HER THAT YOU DO NOT HAVE TO HAVE AN APPT FOR BLOOD WORK BUT I ALSO DO NOT SEE ANY ORDERS FOR BLOOD WORK IN HER CHART.  SAYS THAT THEY RECEIVED A CALL. PLEASE ADVISE WHAT SHE IS TO HAVE DONE.

## 2017-09-20 ENCOUNTER — Ambulatory Visit: Payer: Self-pay | Admitting: Family Medicine

## 2017-09-21 ENCOUNTER — Other Ambulatory Visit: Payer: Self-pay

## 2017-09-21 DIAGNOSIS — R7989 Other specified abnormal findings of blood chemistry: Secondary | ICD-10-CM

## 2017-09-21 NOTE — Telephone Encounter (Signed)
Bone density has been scheduled and the pt is aware of  The date and time

## 2017-09-21 NOTE — Telephone Encounter (Signed)
Pt daughter notified. Lab orders put in and up front

## 2017-09-21 NOTE — Telephone Encounter (Signed)
She needs to return to repeat TSH levels and add free T4 levels to the workup.

## 2017-09-26 ENCOUNTER — Other Ambulatory Visit: Payer: Self-pay | Admitting: Family Medicine

## 2017-09-26 DIAGNOSIS — M810 Age-related osteoporosis without current pathological fracture: Secondary | ICD-10-CM

## 2017-10-03 ENCOUNTER — Telehealth: Payer: Self-pay

## 2017-10-03 NOTE — Telephone Encounter (Signed)
Recertification verbally authorized for skilled nursing

## 2017-10-03 NOTE — Telephone Encounter (Signed)
Please re-certify patient for nursing skills. [facility].  Original referral was done on 09/27/16 by Dr. Wyn Quakerew

## 2017-10-11 ENCOUNTER — Ambulatory Visit (INDEPENDENT_AMBULATORY_CARE_PROVIDER_SITE_OTHER): Payer: Medicare Other | Admitting: Podiatry

## 2017-10-11 ENCOUNTER — Encounter: Payer: Self-pay | Admitting: Podiatry

## 2017-10-11 DIAGNOSIS — Q828 Other specified congenital malformations of skin: Secondary | ICD-10-CM | POA: Diagnosis not present

## 2017-10-11 DIAGNOSIS — B351 Tinea unguium: Secondary | ICD-10-CM

## 2017-10-11 DIAGNOSIS — E1142 Type 2 diabetes mellitus with diabetic polyneuropathy: Secondary | ICD-10-CM

## 2017-10-11 DIAGNOSIS — M79676 Pain in unspecified toe(s): Secondary | ICD-10-CM

## 2017-10-11 NOTE — Progress Notes (Signed)
She presents today with chief complaint of painful elongated toenails and calluses to her left foot.  Objective: Ulcers are palpable bilateral capillary refill time is somewhat sluggish. Toenails are long patellar dystrophic like mycotic painful palpation as well as debridement multiple porokeratotic lesions plantar aspect of the foot as well as the distal toes. No open lesions or wounds are noted.  Assessment: Pain and limp secondary to onychomycosis and porokeratosis.  Plan: Debridement of toenails 1 through 5 bilateral debridement of porokeratotic lesions bilateral.

## 2017-10-12 ENCOUNTER — Other Ambulatory Visit: Payer: Self-pay | Admitting: Family Medicine

## 2017-10-12 DIAGNOSIS — I10 Essential (primary) hypertension: Secondary | ICD-10-CM

## 2017-10-12 DIAGNOSIS — K219 Gastro-esophageal reflux disease without esophagitis: Secondary | ICD-10-CM

## 2017-10-12 NOTE — Telephone Encounter (Signed)
Reviewed last Cr and K+ Rx approved 

## 2017-10-18 ENCOUNTER — Ambulatory Visit
Admission: RE | Admit: 2017-10-18 | Discharge: 2017-10-18 | Disposition: A | Discharge status: Home / Self Care | Payer: Medicare Other | Source: Ambulatory Visit | Attending: Family Medicine | Admitting: Family Medicine

## 2017-10-18 DIAGNOSIS — M810 Age-related osteoporosis without current pathological fracture: Secondary | ICD-10-CM | POA: Diagnosis not present

## 2017-10-18 LAB — T3: T3, Total: 76 ng/dL (ref 76–181)

## 2017-10-18 LAB — T4: T4, Total: 9 ug/dL (ref 5.1–11.9)

## 2017-10-18 LAB — TSH: TSH: 0.51 mIU/L (ref 0.40–4.50)

## 2017-10-20 ENCOUNTER — Encounter: Payer: Self-pay | Admitting: Family Medicine

## 2017-10-20 ENCOUNTER — Ambulatory Visit (INDEPENDENT_AMBULATORY_CARE_PROVIDER_SITE_OTHER): Payer: Medicare Other | Admitting: Family Medicine

## 2017-10-20 VITALS — BP 120/70 | HR 100 | Temp 98.2°F | Resp 18 | Ht <= 58 in | Wt 189.0 lb

## 2017-10-20 DIAGNOSIS — M810 Age-related osteoporosis without current pathological fracture: Secondary | ICD-10-CM | POA: Diagnosis not present

## 2017-10-20 DIAGNOSIS — E1121 Type 2 diabetes mellitus with diabetic nephropathy: Secondary | ICD-10-CM

## 2017-10-20 DIAGNOSIS — L0390 Cellulitis, unspecified: Secondary | ICD-10-CM

## 2017-10-20 DIAGNOSIS — E039 Hypothyroidism, unspecified: Secondary | ICD-10-CM

## 2017-10-20 DIAGNOSIS — B372 Candidiasis of skin and nail: Secondary | ICD-10-CM | POA: Diagnosis not present

## 2017-10-20 DIAGNOSIS — E1165 Type 2 diabetes mellitus with hyperglycemia: Secondary | ICD-10-CM

## 2017-10-20 DIAGNOSIS — S93409A Sprain of unspecified ligament of unspecified ankle, initial encounter: Secondary | ICD-10-CM | POA: Insufficient documentation

## 2017-10-20 DIAGNOSIS — Z8679 Personal history of other diseases of the circulatory system: Secondary | ICD-10-CM | POA: Insufficient documentation

## 2017-10-20 DIAGNOSIS — IMO0002 Reserved for concepts with insufficient information to code with codable children: Secondary | ICD-10-CM

## 2017-10-20 MED ORDER — LEVOTHYROXINE SODIUM 150 MCG PO TABS: 150.0000 ug | ORAL_TABLET | Freq: Every day | ORAL | 0 refills | Status: DC

## 2017-10-20 MED ORDER — KETOCONAZOLE 2 % EX CREA: 1.0000 "application " | TOPICAL_CREAM | Freq: Two times a day (BID) | CUTANEOUS | 0 refills | Status: DC | PRN

## 2017-10-20 MED ORDER — LEVOTHYROXINE SODIUM 150 MCG PO TABS: 150.0000 ug | ORAL_TABLET | Freq: Every day | ORAL | 1 refills | Status: DC

## 2017-10-20 MED ORDER — FLUCONAZOLE 150 MG PO TABS: 150.0000 mg | ORAL_TABLET | Freq: Every day | ORAL | 0 refills | Status: DC

## 2017-10-20 MED ORDER — DOXYCYCLINE HYCLATE 100 MG PO TABS: 100.0000 mg | ORAL_TABLET | Freq: Two times a day (BID) | ORAL | 0 refills | Status: AC

## 2017-10-20 NOTE — Patient Instructions (Addendum)
Take 1/2 tablet of Levothyroxine (Synthroid) 1 day a week, otherwise continue with 1 tablet daily.  Return in 6-8 weeks for recheck of Thyroid levels.   Please make sure you have an appointment scheduled in January with Dr. Juliann Paresallwood per his instructions. Cellulitis, Adult Cellulitis is a skin infection. The infected area is usually red and sore. This condition occurs most often in the arms and lower legs. It is very important to get treated for this condition. Follow these instructions at home:  Take over-the-counter and prescription medicines only as told by your doctor.  If you were prescribed an antibiotic medicine, take it as told by your doctor. Do not stop taking the antibiotic even if you start to feel better.  Drink enough fluid to keep your pee (urine) clear or pale yellow.  Do not touch or rub the infected area.  Raise (elevate) the infected area above the level of your heart while you are sitting or lying down.  Place warm or cold wet cloths (warm or cold compresses) on the infected area. Do this as told by your doctor.  Keep all follow-up visits as told by your doctor. This is important. These visits let your doctor make sure your infection is not getting worse. Contact a doctor if:  You have a fever.  Your symptoms do not get better after 1-2 days of treatment.  Your bone or joint under the infected area starts to hurt after the skin has healed.  Your infection comes back. This can happen in the same area or another area.  You have a swollen bump in the infected area.  You have new symptoms.  You feel ill and also have muscle aches and pains. Get help right away if:  Your symptoms get worse.  You feel very sleepy.  You throw up (vomit) or have watery poop (diarrhea) for a long time.  There are red streaks coming from the infected area.  Your red area gets larger.  Your red area turns darker. This information is not intended to replace advice given to you  by your health care provider. Make sure you discuss any questions you have with your health care provider. Document Released: 05/09/2008 Document Revised: 04/28/2016 Document Reviewed: 09/30/2015 Elsevier Interactive Patient Education  2018 [employer]

## 2017-10-20 NOTE — Progress Notes (Signed)
Name: Karen [name]   MRN: [id number]    DOB: 1941-11-27   Date:10/20/2017       Progress Note  Subjective  Chief Complaint  Chief Complaint  Patient presents with  . Follow-up    Bone Density reults and Thyroid labs  . Rash    on Groin    HPI Patient presents for follow up.  She is new to me and is Dr. Margaretmary Eddy patient:  Bone Density: Recent Bone Density scan on 10/18/2017 shows T-score -4.6, indicating osteoporosis. She has been taking Raloxifene for many years and has been doing well on this medication.  She still has 2 refills left on this, will defer to Dr. Sherryll Burger PCP for additional refills. Discussed fall preventions in detail including removing lose throw rugs, railings on stairs and in shower, keeping living areas well lit.   Hypothyroid: TSH was rechecked 10/18/2017, still on low end of normal at 0.51.  T4 and T3 are WNL.  Discussed several options regarding medications. We will maintain current dosing as her TSH did improve, however we will maintain close follow up in 6 weeks with Dr. Sherryll Burger for repeat labs.  Endorses chronic heat intolerance. She denies fatigue, dry skin, brittle hair/nails, or heart palpitations.  Rash: In groin area x3 days. She describes it as red and irritating.  Applying Desitin and this is helping her discomfort.  She sees home health wound care for her legs, but not for this. Endorses some pain and mild itching.  Notes this area stays very moist because of her body habitus and its location.  Wounds: Has healing ulcerative wound to the LEFT shin. Has new onset small ulcerative would to the right posterior calf.  Pt has uncontrolled T2DM.  Patient Active Problem List   Diagnosis Date Noted  . Osteoporosis 08/22/2017  . COPD (chronic obstructive pulmonary disease) (HCC) 03/28/2017  . Swelling of limb 09/27/2016  . Lower limb ulcer, calf, left, limited to breakdown of skin (HCC) 09/27/2016  . Encounter related to need for assistance with personal care 04/13/2016   . Personal history of other diseases of the circulatory system 11/09/2015  . Easy bruising 10/20/2015  . COPD exacerbation (HCC) 10/19/2015  . Atrial fibrillation (HCC) 10/19/2015  . Diabetes mellitus (HCC) 10/19/2015  . 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 Proteinuria due to type 2 diabetes mellitus (HCC) 06/26/2015  . Morbid obesity due to excess calories (HCC) 06/26/2015  . Obesity, Class II, BMI 35-39.9 06/26/2015  . On continuous oral anticoagulation 06/26/2015  . Asthma with COPD (HCC) 06/26/2015  . Hyperlipidemia 06/26/2015  . Obstructive sleep apnea 06/26/2015  . CAFL (chronic airflow limitation) (HCC) 06/01/2015  . A-fib (HCC) 06/01/2015  . Diabetes (HCC) 06/01/2015  . History of prolonged Q-T interval on ECG 06/01/2015  . HLD (hyperlipidemia) 06/01/2015  . BP (high blood pressure) 06/01/2015  . Obstructive apnea 06/01/2015    Past Surgical History:  Procedure Laterality Date  . CHOLECYSTECTOMY  1993    Family History  Problem Relation Age of Onset  . Heart disease Mother   . Heart disease Father   . Heart disease Sister   . Dementia Sister   . Heart disease Brother   . Diabetes Paternal Grandmother     Social History   Socioeconomic History  . Marital status: Single    Spouse name: Not on file  . Number of children: Not on file  . Years of education: Not on file  . Highest education  level: Not on file  Social Needs  . Financial resource strain: Not on file  . Food insecurity - worry: Not on file  . Food insecurity - inability: Not on file  . Transportation needs - medical: Not on file  . Transportation needs - non-medical: Not on file  Occupational History  . Not on file  Tobacco Use  . Smoking status: Never Smoker  . Smokeless tobacco: Never Used  Substance and Sexual Activity  . Alcohol use: No    Alcohol/week: 0.0 oz  . Drug use: No  . Sexual activity: No  Other Topics Concern  . Not on file  Social  History Narrative  . Not on file     Current Outpatient Medications:  .  amLODipine (NORVASC) 5 MG tablet, TAKE 1 TABLET BY MOUTH ONCE DAILY, Disp: 90 tablet, Rfl: 0 .  B-D INS SYRINGE 0.5CC/31GX5/16 31G X 5/16" 0.5 ML MISC, USE TO INJECT INSULIN TWICE DAILY., Disp: 100 each, Rfl: 6 .  clopidogrel (PLAVIX) 75 MG tablet, TAKE 1 TABLET BY MOUTH ONCE DAILY, Disp: 90 tablet, Rfl: 3 .  digoxin (LANOXIN) 0.125 MG tablet, Take 0.125 mg by mouth every other day. , Disp: , Rfl:  .  diphenhydrAMINE (BENADRYL) 25 mg capsule, Take 1 capsule (25 mg total) by mouth 2 (two) times daily as needed for itching or allergies., Disp: 10 capsule, Rfl: 0 .  ferrous sulfate 325 (65 FE) MG EC tablet, Take 325 mg by mouth every other day. , Disp: , Rfl:  .  furosemide (LASIX) 40 MG tablet, , Disp: , Rfl:  .  HUMULIN 70/30 (70-30) 100 UNIT/ML injection, INJECT 50 UNITS SUBQ ONCE EVERY MORNING AND 27 UNITS ONCE EVERY EVENING, Disp: 20 mL, Rfl: 2 .  insulin lispro (HUMALOG KWIKPEN) 100 UNIT/ML KiwkPen, Use as directed according to Sliding Scale., Disp: 15 mL, Rfl: 2 .  insulin NPH-regular Human (HUMULIN 70/30) (70-30) 100 UNIT/ML injection, INJECT 50 UNITS SUBCUTANEOUSLY ONCE IN THE MORNING AND 27 UNITS EVERYEVENING, Disp: 20 mL, Rfl: 2 .  Insulin Pen Needle (UNIFINE PENTIPS) 31G X 6 MM MISC, Use as directed to inject Insulin., Disp: 90 each, Rfl: 3 .  ipratropium-albuterol (DUONEB) 0.5-2.5 (3) MG/3ML SOLN, USE 1 VIAL VIA NEBULIZER EVERY 6 HOURS AS NEEDED, Disp: 360 mL, Rfl: 0 .  levothyroxine (SYNTHROID, LEVOTHROID) 150 MCG tablet, TAKE 1 TABLET BY MOUTH ONCE DAILY ON AN EMPTY STOMACH. WAIT 30 MINUTES BEFORE TAKING OTHER MEDS., Disp: 90 tablet, Rfl: 1 .  losartan (COZAAR) 100 MG tablet, TAKE 1 TABLET BY MOUTH ONCE DAILY, Disp: 90 tablet, Rfl: 0 .  pantoprazole (PROTONIX) 40 MG tablet, TAKE 1 TABLET BY MOUTH ONCE DAILY, Disp: 90 tablet, Rfl: 0 .  pravastatin (PRAVACHOL) 40 MG tablet, Take 1 tablet (40 mg total) by mouth  daily., Disp: 90 tablet, Rfl: 1 .  predniSONE (DELTASONE) 10 MG tablet, Take 1 tablet by mouth daily., Disp: , Rfl:  .  PULMICORT FLEXHALER 180 MCG/ACT inhaler, , Disp: , Rfl:  .  raloxifene (EVISTA) 60 MG tablet, TAKE 1 TABLET BY MOUTH ONCE DAILY, Disp: 30 tablet, Rfl: 2 .  VENTOLIN HFA 108 (90 BASE) MCG/ACT inhaler, , Disp: , Rfl:  .  Vitamin D, Ergocalciferol, (DRISDOL) 50000 units CAPS capsule, Take 1 capsule (50,000 Units total) by mouth every 7 (seven) days., Disp: 12 capsule, Rfl: 0 .  vitamin E 400 UNIT capsule, Take 400 Units by mouth daily., Disp: , Rfl:  .  ALPRAZolam (XANAX) 0.25 MG tablet, Take 1  tablet (0.25 mg total) by mouth 2 (two) times daily as needed for anxiety. (Patient not taking: Reported on 10/20/2017), Disp: 20 tablet, Rfl: 0  Allergies  Allergen Reactions  . Ace Inhibitors Other (See Comments)    Cough      ROS  Constitutional: Negative for fever or weight change.  Respiratory: Negative for cough and abnormal/increased shortness of breath.   Cardiovascular: Negative for chest pain or palpitations.  Gastrointestinal: Negative for abdominal pain, no bowel changes.  Musculoskeletal: Negative for gait problem or joint swelling.  Skin: Negative for rash.  Neurological: Negative for dizziness or headache.  No other specific complaints in a complete review of systems (except as listed in HPI above).  Objective  Vitals:   10/20/17 0853  BP: 120/70  Pulse: 100  Resp: 18  Temp: 98.2 F (36.8 C)  TempSrc: Oral  SpO2: 93%  Weight: 189 lb (85.7 kg)  Height: 4\' 9"  (1.448 m)    Body mass index is 40.9 kg/m.  Physical Exam Constitutional: Patient appears well-developed and well-nourished. No distress.  HENT: Head: Normocephalic and atraumatic. Eyes: Conjunctivae and EOM are normal. Pupils are equal, round, and reactive to light. No scleral icterus.  Neck: Normal range of motion. Neck supple. No JVD present. No thyromegaly present.  Cardiovascular: Normal  rate, regular rhythm and normal heart sounds.  Murmur heard. +3 BLE edema - pt states this is baseline. Pulmonary/Chest: Effort normal and breath sounds normal. No respiratory distress. Musculoskeletal: Normal range of motion, no joint effusions. No gross deformities Neurological: he is alert and oriented to person, place, and time. No cranial nerve deficit. Coordination, balance, strength, speech and gait are normal.  Skin: Skin is warm and dry. Dry erythematous scaling rash to RIGHT groin and right lower panus.  RIGHT calf exhibits 0.5cm open wound with yellow/green wound bed with surrounding erythema that is tender on palpation.  Psychiatric: Patient has a normal mood and affect. behavior is normal. Judgment and thought content normal.    Recent Results (from the past 2160 hour(s))  POCT glycosylated hemoglobin (Hb A1C)     Status: Abnormal   Collection Time: 09/11/17  3:20 PM  Result Value Ref Range   Hemoglobin A1C 9.2   CBC with Differential/Platelet     Status: Abnormal   Collection Time: 09/11/17  4:12 PM  Result Value Ref Range   WBC 8.2 3.8 - 10.8 Thousand/uL   RBC 4.30 3.80 - 5.10 Million/uL   Hemoglobin 12.2 11.7 - 15.5 g/dL   HCT 40.9 81.1 - 91.4 %   MCV 89.1 80.0 - 100.0 fL   MCH 28.4 27.0 - 33.0 pg   MCHC 31.9 (L) 32.0 - 36.0 g/dL   RDW 78.2 95.6 - 21.3 %   Platelets 177 140 - 400 Thousand/uL   MPV 11.5 7.5 - 12.5 fL   Neutro Abs 6,642 1,500 - 7,800 cells/uL   Lymphs Abs 1,033 850 - 3,900 cells/uL   WBC mixed population 353 200 - 950 cells/uL   Eosinophils Absolute 98 15 - 500 cells/uL   Basophils Absolute 74 0 - 200 cells/uL   Neutrophils Relative % 81 %   Total Lymphocyte 12.6 %   Monocytes Relative 4.3 %   Eosinophils Relative 1.2 %   Basophils Relative 0.9 %  TSH     Status: Abnormal   Collection Time: 09/11/17  4:12 PM  Result Value Ref Range   TSH 0.27 (L) 0.40 - 4.50 mIU/L  VITAMIN D 25 Hydroxy (Vit-D Deficiency, Fractures)  Status: Abnormal    Collection Time: 09/11/17  4:12 PM  Result Value Ref Range   Vit D, 25-Hydroxy 8 (L) 30 - 100 ng/mL    Comment: Vitamin D Status         25-OH Vitamin D: . Deficiency:                    <20 ng/mL Insufficiency:             20 - 29 ng/mL Optimal:                 > or = 30 ng/mL . For 25-OH Vitamin D testing on patients on  D2-supplementation and patients for whom quantitation  of D2 and D3 fractions is required, the QuestAssureD(TM) 25-OH VIT D, (D2,D3), LC/MS/MS is recommended: order  code [id number] (patients >7310yrs). . For more information on this test, go to: http://education.questdiagnostics.com/faq/FAQ163 (This link is being provided for  informational/educational purposes only.)   COMPLETE METABOLIC PANEL WITH GFR     Status: Abnormal   Collection Time: 09/11/17  4:12 PM  Result Value Ref Range   Glucose, Bld 335 (H) 65 - 139 mg/dL    Comment: .        Non-fasting reference interval .    BUN 23 7 - 25 mg/dL   Creat 6.041.12 (H) 5.400.60 - 0.93 mg/dL    Comment: For patients >76 years of age, the reference limit for Creatinine is approximately 13% higher for people identified as African-American. .    GFR, Est Non African American 48 (L) > OR = 60 mL/min/1.6473m2   GFR, Est African American 55 (L) > OR = 60 mL/min/1.5573m2   BUN/Creatinine Ratio 21 6 - 22 (calc)   Sodium 141 135 - 146 mmol/L   Potassium 4.5 3.5 - 5.3 mmol/L   Chloride 101 98 - 110 mmol/L   CO2 31 20 - 32 mmol/L   Calcium 8.8 8.6 - 10.4 mg/dL   Total Protein 6.1 6.1 - 8.1 g/dL   Albumin 3.8 3.6 - 5.1 g/dL   Globulin 2.3 1.9 - 3.7 g/dL (calc)   AG Ratio 1.7 1.0 - 2.5 (calc)   Total Bilirubin 0.4 0.2 - 1.2 mg/dL   Alkaline phosphatase (APISO) 57 33 - 130 U/L   AST 13 10 - 35 U/L   ALT 14 6 - 29 U/L  TSH     Status: None   Collection Time: 10/18/17  3:25 PM  Result Value Ref Range   TSH 0.51 0.40 - 4.50 mIU/L  T4     Status: None   Collection Time: 10/18/17  3:25 PM  Result Value Ref Range   T4, Total 9.0  5.1 - 11.9 mcg/dL  T3     Status: None   Collection Time: 10/18/17  3:25 PM  Result Value Ref Range   T3, Total 76 76 - 181 ng/dL   JWJ1/9PHQ2/9: [facility] 04/05/2017 12/19/2016 11/02/2015 10/27/2015 10/22/2015  Decreased Interest 0 0 0 0 0  Down, Depressed, Hopeless 0 0 0 0 0  PHQ - 2 Score 0 0 0 0 0   Fall Risk: Fall Risk  04/05/2017 12/19/2016 11/02/2015 10/27/2015 10/22/2015  Falls in the past year? No No No No No  Risk for fall due to : - Other (Comment) - - -   Assessment & Plan  1. Age-related osteoporosis without current pathological fracture Continue Raloxifene, safety precautions per HPI.  2. Hypothyroidism, unspecified type - levothyroxine (SYNTHROID,  LEVOTHROID) 150 MCG tablet; Take 1 tablet (150 mcg total) daily before breakfast by mouth.  Dispense: 90 tablet; Refill: 0 - We will maintain current dosing as her TSH did improve, however we will maintain close follow up in 6 weeks with Dr. Sherryll BurgerShah for repeat labs.  3. Wound cellulitis - Ambulatory referral to Home Health - doxycycline (VIBRA-TABS) 100 MG tablet; Take 1 tablet (100 mg total) 2 (two) times daily for 7 days by mouth.  Dispense: 14 tablet; Refill: 0  4. Candidiasis of skin - ketoconazole (NIZORAL) 2 % cream; Apply 1 application 2 (two) times daily as needed topically for irritation.  Dispense: 30 g; Refill: 0 - fluconazole (DIFLUCAN) 150 MG tablet; Take 1 tablet (150 mg total) daily by mouth.  Dispense: 3 tablet; Refill: 0 - Ambulatory referral to Home Health - We discussed several options to treat this large area of candida, and because of patient's COPD, we will avoid powder formulations.  Diflucan PO x3 days combined with topical ketoconazole. I have also included this area in the referral to home health to ensure ongoing professional monitoring between office visits.  Advised patient to keep area clean and dry.  5. 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 Lifestyle modifications reinforced. Patient's ability to exercise is quite limited due to her complicated health status with A-fib, COPD, and Chronic O2 use.  Dr. Carlynn PurlSowles was consulted during this visit and is in agreement with the plan of care. Follow up in 6 weeks with Dr. Sherryll BurgerShah.

## 2017-10-21 ENCOUNTER — Other Ambulatory Visit: Payer: Self-pay | Admitting: Family Medicine

## 2017-10-21 DIAGNOSIS — M810 Age-related osteoporosis without current pathological fracture: Secondary | ICD-10-CM

## 2017-10-27 ENCOUNTER — Other Ambulatory Visit: Payer: Self-pay | Admitting: Family Medicine

## 2017-11-15 ENCOUNTER — Other Ambulatory Visit: Payer: Self-pay | Admitting: Family Medicine

## 2017-11-15 DIAGNOSIS — I10 Essential (primary) hypertension: Secondary | ICD-10-CM

## 2017-11-15 DIAGNOSIS — E1165 Type 2 diabetes mellitus with hyperglycemia: Secondary | ICD-10-CM

## 2017-11-15 DIAGNOSIS — E1121 Type 2 diabetes mellitus with diabetic nephropathy: Secondary | ICD-10-CM

## 2017-11-15 DIAGNOSIS — IMO0002 Reserved for concepts with insufficient information to code with codable children: Secondary | ICD-10-CM

## 2017-11-22 ENCOUNTER — Other Ambulatory Visit: Payer: Self-pay | Admitting: Internal Medicine

## 2017-11-23 ENCOUNTER — Telehealth: Payer: Self-pay

## 2017-11-23 NOTE — Telephone Encounter (Signed)
Spoke to OrestesStephane and she stated she had a confirmation on the fax. I mention to her that we were changing certain stuff with her phones and fax and we were getting some our faxes and it could of been what happen. She fax over a new form. Received the new form  and I will give the form to Dr. Sherryll BurgerShah for review

## 2017-11-23 NOTE — Telephone Encounter (Signed)
Copied from CRM [id number]. Topic: General - Other >> Nov 23, 2017 10:22 AM Lelon FrohlichGolden, Tashia, RMA wrote: Reason for CRM: Judeth CornfieldStephanie with [facility] was calling to check on a re-cert she sent back in Nov please call her @[phone] 

## 2017-11-27 ENCOUNTER — Telehealth: Payer: Self-pay | Admitting: Family Medicine

## 2017-11-27 NOTE — Telephone Encounter (Signed)
Copied from CRM [id number]. Topic: Quick Communication - See Telephone Encounter >> Nov 27, 2017 10:24 AM Jolayne Hainesaylor, Brittany L wrote: CRM for notification. See Telephone encounter for:   11/27/17.   Kim the nurse from [facility] wants o make a change to her wound care orders, what they have been doing has not been working. The nurse Selena BattenKim wants to know if they could try Aquacel. She wants to continue seeing her twice a week, also. She is with her now & wants to know if she could start it today. Cell is [phone]

## 2017-11-27 NOTE — Telephone Encounter (Signed)
Patient's most recent visit was with the nurse practitioner Maurice SmallEmily Boyce, I have not personally evaluated the wound. She should either be referred to wound care or come in for an appointment

## 2017-11-29 NOTE — Telephone Encounter (Signed)
Per the request of Maurice SmallEmily Boyce, FNP, I tried to returned Kim's call with [facility], but there was no answer. A message was left for her granting permission to try the Aquacel but if this patient's sx does not improve or worsen to have her follow up with Dr. Sherryll BurgerShah.

## 2017-11-29 NOTE — Telephone Encounter (Signed)
Please call [facility] and advise that they may try Aquacel. It looks like these wounds appeared to be healing at her last visit, so if they are not improving or are worsening, pt needs to come in to be seen by PCP Dr. Sherryll BurgerShah for re-evaluation.

## 2017-12-01 ENCOUNTER — Ambulatory Visit: Payer: Medicare Other | Admitting: Family Medicine

## 2017-12-02 ENCOUNTER — Other Ambulatory Visit: Payer: Self-pay | Admitting: Family Medicine

## 2017-12-04 ENCOUNTER — Other Ambulatory Visit: Payer: Self-pay | Admitting: Family Medicine

## 2017-12-07 ENCOUNTER — Inpatient Hospital Stay: Payer: Medicare Other

## 2017-12-07 ENCOUNTER — Encounter: Payer: Self-pay | Admitting: Internal Medicine

## 2017-12-07 ENCOUNTER — Encounter: Payer: Self-pay | Admitting: Family Medicine

## 2017-12-07 ENCOUNTER — Other Ambulatory Visit: Payer: Self-pay

## 2017-12-07 ENCOUNTER — Ambulatory Visit (INDEPENDENT_AMBULATORY_CARE_PROVIDER_SITE_OTHER): Payer: Medicare Other | Admitting: Internal Medicine

## 2017-12-07 ENCOUNTER — Inpatient Hospital Stay
Admission: AD | Admit: 2017-12-07 | Discharge: 2017-12-13 | DRG: 190 | Disposition: A | Discharge status: Skilled Nursing Facility | Payer: Medicare Other | Source: Ambulatory Visit | Attending: Internal Medicine | Admitting: Internal Medicine

## 2017-12-07 VITALS — BP 135/61 | HR 107 | Temp 99.5°F | Ht 59.0 in | Wt 185.2 lb

## 2017-12-07 DIAGNOSIS — E785 Hyperlipidemia, unspecified: Secondary | ICD-10-CM | POA: Diagnosis not present

## 2017-12-07 DIAGNOSIS — I482 Chronic atrial fibrillation, unspecified: Secondary | ICD-10-CM

## 2017-12-07 DIAGNOSIS — Z9981 Dependence on supplemental oxygen: Secondary | ICD-10-CM | POA: Diagnosis not present

## 2017-12-07 DIAGNOSIS — K219 Gastro-esophageal reflux disease without esophagitis: Secondary | ICD-10-CM | POA: Diagnosis present

## 2017-12-07 DIAGNOSIS — Z888 Allergy status to other drugs, medicaments and biological substances status: Secondary | ICD-10-CM | POA: Diagnosis not present

## 2017-12-07 DIAGNOSIS — J9601 Acute respiratory failure with hypoxia: Secondary | ICD-10-CM | POA: Diagnosis not present

## 2017-12-07 DIAGNOSIS — Z66 Do not resuscitate: Secondary | ICD-10-CM | POA: Diagnosis present

## 2017-12-07 DIAGNOSIS — Z7952 Long term (current) use of systemic steroids: Secondary | ICD-10-CM | POA: Diagnosis not present

## 2017-12-07 DIAGNOSIS — I10 Essential (primary) hypertension: Secondary | ICD-10-CM | POA: Diagnosis present

## 2017-12-07 DIAGNOSIS — J441 Chronic obstructive pulmonary disease with (acute) exacerbation: Secondary | ICD-10-CM | POA: Diagnosis present

## 2017-12-07 DIAGNOSIS — M810 Age-related osteoporosis without current pathological fracture: Secondary | ICD-10-CM | POA: Diagnosis present

## 2017-12-07 DIAGNOSIS — Z794 Long term (current) use of insulin: Secondary | ICD-10-CM

## 2017-12-07 DIAGNOSIS — E11649 Type 2 diabetes mellitus with hypoglycemia without coma: Secondary | ICD-10-CM | POA: Diagnosis not present

## 2017-12-07 DIAGNOSIS — Z79890 Hormone replacement therapy (postmenopausal): Secondary | ICD-10-CM | POA: Diagnosis not present

## 2017-12-07 DIAGNOSIS — R0602 Shortness of breath: Secondary | ICD-10-CM

## 2017-12-07 DIAGNOSIS — Z7951 Long term (current) use of inhaled steroids: Secondary | ICD-10-CM

## 2017-12-07 DIAGNOSIS — R0603 Acute respiratory distress: Secondary | ICD-10-CM

## 2017-12-07 DIAGNOSIS — F419 Anxiety disorder, unspecified: Secondary | ICD-10-CM | POA: Diagnosis not present

## 2017-12-07 DIAGNOSIS — L97211 Non-pressure chronic ulcer of right calf limited to breakdown of skin: Secondary | ICD-10-CM | POA: Diagnosis present

## 2017-12-07 DIAGNOSIS — Z7902 Long term (current) use of antithrombotics/antiplatelets: Secondary | ICD-10-CM | POA: Diagnosis not present

## 2017-12-07 DIAGNOSIS — E11622 Type 2 diabetes mellitus with other skin ulcer: Secondary | ICD-10-CM | POA: Diagnosis not present

## 2017-12-07 DIAGNOSIS — Z79810 Long term (current) use of selective estrogen receptor modulators (SERMs): Secondary | ICD-10-CM

## 2017-12-07 DIAGNOSIS — R0600 Dyspnea, unspecified: Secondary | ICD-10-CM

## 2017-12-07 DIAGNOSIS — J9621 Acute and chronic respiratory failure with hypoxia: Secondary | ICD-10-CM | POA: Diagnosis present

## 2017-12-07 DIAGNOSIS — E039 Hypothyroidism, unspecified: Secondary | ICD-10-CM | POA: Diagnosis present

## 2017-12-07 DIAGNOSIS — R0609 Other forms of dyspnea: Secondary | ICD-10-CM

## 2017-12-07 LAB — CBC
HEMATOCRIT: 35.5 % (ref 35.0–47.0)
Hemoglobin: 11.4 g/dL — ABNORMAL LOW (ref 12.0–16.0)
MCH: 28.4 pg (ref 26.0–34.0)
MCHC: 32.1 g/dL (ref 32.0–36.0)
MCV: 88.6 fL (ref 80.0–100.0)
PLATELETS: 191 10*3/uL (ref 150–440)
RBC: 4 MIL/uL (ref 3.80–5.20)
RDW: 14 % (ref 11.5–14.5)
WBC: 8.7 10*3/uL (ref 3.6–11.0)

## 2017-12-07 LAB — BLOOD GAS, ARTERIAL
ACID-BASE EXCESS: 6.3 mmol/L — AB (ref 0.0–2.0)
BICARBONATE: 31.8 mmol/L — AB (ref 20.0–28.0)
FIO2: 0.28
O2 Saturation: 90.2 %
PH ART: 7.42 (ref 7.350–7.450)
PO2 ART: 58 mmHg — AB (ref 83.0–108.0)
Patient temperature: 37
pCO2 arterial: 49 mmHg — ABNORMAL HIGH (ref 32.0–48.0)

## 2017-12-07 LAB — BASIC METABOLIC PANEL
ANION GAP: 9 (ref 5–15)
BUN: 28 mg/dL — ABNORMAL HIGH (ref 6–20)
CALCIUM: 8.5 mg/dL — AB (ref 8.9–10.3)
CO2: 31 mmol/L (ref 22–32)
CREATININE: 1.21 mg/dL — AB (ref 0.44–1.00)
Chloride: 99 mmol/L — ABNORMAL LOW (ref 101–111)
GFR, EST AFRICAN AMERICAN: 49 mL/min — AB (ref >60)
GFR, EST NON AFRICAN AMERICAN: 42 mL/min — AB (ref >60)
Glucose, Bld: 230 mg/dL — ABNORMAL HIGH (ref 65–99)
Potassium: 4.1 mmol/L (ref 3.5–5.1)
SODIUM: 139 mmol/L (ref 135–145)

## 2017-12-07 LAB — GLUCOSE, CAPILLARY
GLUCOSE-CAPILLARY: 229 mg/dL — AB (ref 65–99)
Glucose-Capillary: 254 mg/dL — ABNORMAL HIGH (ref 65–99)
Glucose-Capillary: 210 mg/dL — ABNORMAL HIGH (ref 65–99)

## 2017-12-07 MED ORDER — LEVOTHYROXINE SODIUM 50 MCG PO TABS
150.0000 ug | ORAL_TABLET | Freq: Every day | ORAL | Status: DC
  Administered 2017-12-08 – 2017-12-13 (×6): 150 ug via ORAL
  Filled 2017-12-07 (×6): qty 1

## 2017-12-07 MED ORDER — IPRATROPIUM-ALBUTEROL 0.5-2.5 (3) MG/3ML IN SOLN: 3.0000 mL | Freq: Four times a day (QID) | RESPIRATORY_TRACT | Status: DC

## 2017-12-07 MED ORDER — BUDESONIDE 0.5 MG/2ML IN SUSP
0.5000 mg | Freq: Two times a day (BID) | RESPIRATORY_TRACT | Status: DC
  Administered 2017-12-07 – 2017-12-13 (×10): 0.5 mg via RESPIRATORY_TRACT
  Filled 2017-12-07 (×11): qty 2

## 2017-12-07 MED ORDER — ALPRAZOLAM 0.25 MG PO TABS: 0.2500 mg | ORAL_TABLET | Freq: Three times a day (TID) | ORAL | Status: DC | PRN

## 2017-12-07 MED ORDER — FERROUS SULFATE 325 (65 FE) MG PO TABS
325.0000 mg | ORAL_TABLET | ORAL | Status: DC
  Administered 2017-12-07 – 2017-12-09 (×2): 325 mg via ORAL
  Filled 2017-12-07 (×3): qty 1

## 2017-12-07 MED ORDER — CLOPIDOGREL BISULFATE 75 MG PO TABS
75.0000 mg | ORAL_TABLET | Freq: Every day | ORAL | Status: DC
  Administered 2017-12-07 – 2017-12-13 (×7): 75 mg via ORAL
  Filled 2017-12-07 (×7): qty 1

## 2017-12-07 MED ORDER — ORAL CARE MOUTH RINSE
15.0000 mL | Freq: Two times a day (BID) | OROMUCOSAL | Status: DC
  Administered 2017-12-07 – 2017-12-11 (×6): 15 mL via OROMUCOSAL

## 2017-12-07 MED ORDER — METHYLPREDNISOLONE SODIUM SUCC 125 MG IJ SOLR
60.0000 mg | INTRAMUSCULAR | Status: AC
  Administered 2017-12-07: 60 mg via INTRAVENOUS
  Filled 2017-12-07: qty 2

## 2017-12-07 MED ORDER — INSULIN ASPART 100 UNIT/ML ~~LOC~~ SOLN
0.0000 [IU] | Freq: Three times a day (TID) | SUBCUTANEOUS | Status: DC
  Administered 2017-12-07 – 2017-12-08 (×2): 7 [IU] via SUBCUTANEOUS
  Administered 2017-12-08: 11 [IU] via SUBCUTANEOUS
  Administered 2017-12-08: 15 [IU] via SUBCUTANEOUS
  Administered 2017-12-09: 11 [IU] via SUBCUTANEOUS
  Administered 2017-12-09: 4 [IU] via SUBCUTANEOUS
  Administered 2017-12-09: 7 [IU] via SUBCUTANEOUS
  Administered 2017-12-10: 3 [IU] via SUBCUTANEOUS
  Administered 2017-12-10: 7 [IU] via SUBCUTANEOUS
  Administered 2017-12-10 – 2017-12-11 (×2): 3 [IU] via SUBCUTANEOUS
  Administered 2017-12-12: 15 [IU] via SUBCUTANEOUS
  Administered 2017-12-12: 20 [IU] via SUBCUTANEOUS
  Administered 2017-12-13: 15 [IU] via SUBCUTANEOUS
  Filled 2017-12-07 (×14): qty 1

## 2017-12-07 MED ORDER — IPRATROPIUM-ALBUTEROL 0.5-2.5 (3) MG/3ML IN SOLN
3.0000 mL | RESPIRATORY_TRACT | Status: DC
  Administered 2017-12-07 – 2017-12-13 (×31): 3 mL via RESPIRATORY_TRACT
  Filled 2017-12-07 (×30): qty 3

## 2017-12-07 MED ORDER — VITAMIN D (ERGOCALCIFEROL) 1.25 MG (50000 UNIT) PO CAPS: 50000.0000 [IU] | ORAL_CAPSULE | ORAL | Status: DC

## 2017-12-07 MED ORDER — ACETAMINOPHEN 325 MG PO TABS: 650.0000 mg | ORAL_TABLET | Freq: Four times a day (QID) | ORAL | Status: DC | PRN

## 2017-12-07 MED ORDER — FUROSEMIDE 40 MG PO TABS
40.0000 mg | ORAL_TABLET | Freq: Two times a day (BID) | ORAL | Status: DC
  Administered 2017-12-07 – 2017-12-10 (×6): 40 mg via ORAL
  Filled 2017-12-07 (×6): qty 1

## 2017-12-07 MED ORDER — METHYLPREDNISOLONE SODIUM SUCC 40 MG IJ SOLR
40.0000 mg | Freq: Four times a day (QID) | INTRAMUSCULAR | Status: DC
  Administered 2017-12-07 – 2017-12-09 (×8): 40 mg via INTRAVENOUS
  Filled 2017-12-07 (×8): qty 1

## 2017-12-07 MED ORDER — VITAMIN E 180 MG (400 UNIT) PO CAPS
400.0000 [IU] | ORAL_CAPSULE | Freq: Every day | ORAL | Status: DC
  Administered 2017-12-07 – 2017-12-10 (×4): 400 [IU] via ORAL
  Filled 2017-12-07 (×4): qty 1

## 2017-12-07 MED ORDER — INSULIN ASPART 100 UNIT/ML ~~LOC~~ SOLN
0.0000 [IU] | Freq: Every day | SUBCUTANEOUS | Status: DC
  Administered 2017-12-07: 2 [IU] via SUBCUTANEOUS
  Administered 2017-12-09: 3 [IU] via SUBCUTANEOUS
  Administered 2017-12-11: 2 [IU] via SUBCUTANEOUS
  Filled 2017-12-07 (×3): qty 1

## 2017-12-07 MED ORDER — INSULIN ASPART PROT & ASPART (70-30 MIX) 100 UNIT/ML ~~LOC~~ SUSP
50.0000 [IU] | Freq: Every day | SUBCUTANEOUS | Status: DC
  Administered 2017-12-08 – 2017-12-10 (×3): 50 [IU] via SUBCUTANEOUS
  Filled 2017-12-07 (×3): qty 1

## 2017-12-07 MED ORDER — INSULIN ASPART PROT & ASPART (70-30 MIX) 100 UNIT/ML ~~LOC~~ SUSP
27.0000 [IU] | Freq: Every day | SUBCUTANEOUS | Status: DC
  Administered 2017-12-07 – 2017-12-10 (×4): 27 [IU] via SUBCUTANEOUS
  Filled 2017-12-07 (×5): qty 1

## 2017-12-07 MED ORDER — PRAVASTATIN SODIUM 40 MG PO TABS
40.0000 mg | ORAL_TABLET | Freq: Every day | ORAL | Status: DC
  Administered 2017-12-08 – 2017-12-10 (×3): 40 mg via ORAL
  Filled 2017-12-07 (×3): qty 1

## 2017-12-07 MED ORDER — DIGOXIN 125 MCG PO TABS
0.1250 mg | ORAL_TABLET | ORAL | Status: DC
  Filled 2017-12-07: qty 1

## 2017-12-07 MED ORDER — IPRATROPIUM-ALBUTEROL 0.5-2.5 (3) MG/3ML IN SOLN
3.0000 mL | Freq: Once | RESPIRATORY_TRACT | Status: AC
  Administered 2017-12-07: 3 mL via RESPIRATORY_TRACT
  Filled 2017-12-07: qty 3

## 2017-12-07 MED ORDER — AMLODIPINE BESYLATE 5 MG PO TABS
5.0000 mg | ORAL_TABLET | Freq: Every day | ORAL | Status: DC
  Administered 2017-12-08 – 2017-12-10 (×3): 5 mg via ORAL
  Filled 2017-12-07 (×3): qty 1

## 2017-12-07 MED ORDER — ENOXAPARIN SODIUM 40 MG/0.4ML ~~LOC~~ SOLN
40.0000 mg | SUBCUTANEOUS | Status: DC
  Administered 2017-12-08 – 2017-12-13 (×6): 40 mg via SUBCUTANEOUS
  Filled 2017-12-07 (×6): qty 0.4

## 2017-12-07 MED ORDER — IPRATROPIUM-ALBUTEROL 0.5-2.5 (3) MG/3ML IN SOLN
RESPIRATORY_TRACT | Status: AC
  Administered 2017-12-07: 15:00:00
  Filled 2017-12-07: qty 3

## 2017-12-07 MED ORDER — PANTOPRAZOLE SODIUM 40 MG PO TBEC
40.0000 mg | DELAYED_RELEASE_TABLET | Freq: Every day | ORAL | Status: DC
  Administered 2017-12-08 – 2017-12-13 (×6): 40 mg via ORAL
  Filled 2017-12-07 (×6): qty 1

## 2017-12-07 MED ORDER — LOSARTAN POTASSIUM 50 MG PO TABS
100.0000 mg | ORAL_TABLET | Freq: Every day | ORAL | Status: DC
  Administered 2017-12-08 – 2017-12-13 (×6): 100 mg via ORAL
  Filled 2017-12-07 (×6): qty 2

## 2017-12-07 MED ORDER — RALOXIFENE HCL 60 MG PO TABS
60.0000 mg | ORAL_TABLET | Freq: Every day | ORAL | Status: DC
  Administered 2017-12-07 – 2017-12-13 (×7): 60 mg via ORAL
  Filled 2017-12-07 (×7): qty 1

## 2017-12-07 MED ORDER — ACETAMINOPHEN 650 MG RE SUPP: 650.0000 mg | Freq: Four times a day (QID) | RECTAL | Status: DC | PRN

## 2017-12-07 MED ORDER — DIGOXIN 125 MCG PO TABS
0.1250 mg | ORAL_TABLET | ORAL | Status: DC
  Administered 2017-12-08 – 2017-12-12 (×3): 0.125 mg via ORAL
  Filled 2017-12-07 (×3): qty 1

## 2017-12-07 NOTE — H&P (Signed)
[facility] - [place] [facility]   PATIENT NAME: Karen [name]    MR#:  [id number]  DATE OF BIRTH:  10-19-1941  DATE OF ADMISSION:  12/07/2017  PRIMARY CARE PHYSICIAN: Ellyn HackShah, Syed Asad A, MD   REQUESTING/REFERRING PHYSICIAN: Dr. Freda MunroSaadat Khan  CHIEF COMPLAINT:  No chief complaint on file. Shortness of breath, Resp. Distress.   HISTORY OF PRESENT ILLNESS:  Karen [name]  is a 77 y.o. 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 Patient says she's been having worsening shortness of breath now for the past 4-5 days. She does have chronic respiratory failure is on oxygen at 2 L but over the past few days her shortness of breath has gotten significantly worse on minimal exertion. She admits to a cough which is productive of yellow sputum. She denies any fevers, chills, chest pains, nausea or vomiting. She went to see her pulmonologist who then referred her to the hospital for direct admission due to worsening respiratory distress and failure. Patient was noted to be in acute on chronic respiratory failure with hypoxia secondary to COPD exacerbation.  PAST MEDICAL HISTORY:   Past Medical History:  Diagnosis Date  . COPD (chronic obstructive pulmonary disease) (HCC)   . Diabetes mellitus without complication (HCC)   . Hyperlipidemia   . Hypertension     PAST SURGICAL HISTORY:   Past Surgical History:  Procedure Laterality Date  . CHOLECYSTECTOMY  1993    SOCIAL HISTORY:   Social History   Tobacco Use  . Smoking status: Never Smoker  . Smokeless tobacco: Never Used  Substance Use Topics  . Alcohol use: No    Alcohol/week: 0.0 oz    FAMILY HISTORY:   Family History  Problem Relation Age of Onset  . Heart disease Mother   . Heart disease Father   . Heart disease Sister   . Dementia Sister   . Heart disease Brother   .  Diabetes Paternal Grandmother     DRUG ALLERGIES:   Allergies  Allergen Reactions  . Ace Inhibitors Other (See Comments)    Cough   . Singulair [Montelukast Sodium]     REVIEW OF SYSTEMS:   Review of Systems  Constitutional: Positive for chills. Negative for fever and weight loss.  HENT: Negative for congestion, nosebleeds and tinnitus.   Eyes: Negative for blurred vision, double vision and redness.  Respiratory: Positive for cough, sputum production (Yellow), shortness of breath and wheezing. Negative for hemoptysis.   Cardiovascular: Negative for chest pain, orthopnea, leg swelling and PND.  Gastrointestinal: Negative for abdominal pain, diarrhea, melena, nausea and vomiting.  Genitourinary: Negative for dysuria, hematuria and urgency.  Musculoskeletal: Negative for falls and joint pain.  Neurological: Negative for dizziness, tingling, sensory change, focal weakness, seizures, weakness and headaches.  Endo/Heme/Allergies: Negative for polydipsia. Does not bruise/bleed easily.  Psychiatric/Behavioral: Negative for depression and memory loss. The patient is not nervous/anxious.     MEDICATIONS AT HOME:   Prior to Admission medications   Medication Sig Start Date End Date Taking? Authorizing Provider  amLODipine (NORVASC) 5 MG tablet TAKE 1 TABLET BY MOUTH ONCE DAILY 11/15/17  Yes Ellyn HackShah, Syed Asad A, MD  B-D INS SYRINGE 0.5CC/31GX5/16 31G X 5/16" 0.5 ML MISC USE TO INJECT INSULIN TWICE DAILY. 05/09/17  Yes Ellyn HackShah, Syed Asad A, MD  diphenhydrAMINE (BENADRYL) 25 mg capsule Take 1 capsule (25 mg total) by  mouth 2 (two) times daily as needed for itching or allergies. 03/30/17  Yes Altamese Dilling, MD  Insulin Pen Needle (UNIFINE PENTIPS) 31G X 6 MM MISC USE AS DIRECTED 10/25/17  Yes Ellyn Hack, MD  levothyroxine (SYNTHROID, LEVOTHROID) 150 MCG tablet Take 1 tablet (150 mcg total) daily before breakfast by mouth. 10/20/17  Yes Doren Custard, FNP  losartan (COZAAR) 100 MG  tablet TAKE 1 TABLET BY MOUTH ONCE DAILY 10/12/17  Yes Lada, Janit Bern, MD  pantoprazole (PROTONIX) 40 MG tablet TAKE 1 TABLET BY MOUTH ONCE DAILY 10/12/17  Yes Lada, Janit Bern, MD  ALPRAZolam (XANAX) 0.25 MG tablet Take 1 tablet (0.25 mg total) by mouth 2 (two) times daily as needed for anxiety. Patient not taking: Reported on 10/20/2017 03/30/17   Altamese Dilling, MD  clopidogrel (PLAVIX) 75 MG tablet TAKE 1 TABLET BY MOUTH ONCE DAILY 08/09/17   Ellyn Hack, MD  digoxin (LANOXIN) 0.125 MG tablet Take 0.125 mg by mouth every other day.     [provider]  ferrous sulfate 325 (65 FE) MG EC tablet Take 325 mg by mouth every other day.     [provider]  furosemide (LASIX) 40 MG tablet  08/09/17   [provider]  HUMULIN 70/30 (70-30) 100 UNIT/ML injection INJECT 50 UNITS SUBQ ONCE EVERY MORNING AND 27 UNITS EVERY EVENING 11/15/17   Ellyn Hack, MD  insulin lispro (HUMALOG KWIKPEN) 100 UNIT/ML KiwkPen Use as directed according to Sliding Scale. 05/09/17   Ellyn Hack, MD  insulin NPH-regular Human (HUMULIN 70/30) (70-30) 100 UNIT/ML injection INJECT 50 UNITS SUBCUTANEOUSLY ONCE IN THE MORNING AND 27 UNITS EVERYEVENING 05/09/17   Velta Addison A, MD  ipratropium-albuterol (DUONEB) 0.5-2.5 (3) MG/3ML SOLN USE 1 VIAL VIA NABULIZER EVERY 6 HOURS AS NEEDED 12/04/17   Ellyn Hack, MD  ketoconazole (NIZORAL) 2 % cream Apply 1 application 2 (two) times daily as needed topically for irritation. 10/20/17   Doren Custard, FNP  pravastatin (PRAVACHOL) 40 MG tablet Take 1 tablet (40 mg total) by mouth daily. 05/09/17   Ellyn Hack, MD  predniSONE (DELTASONE) 10 MG tablet Take 1 tablet by mouth daily. 04/08/17   [provider]  predniSONE (DELTASONE) 10 MG tablet TAKE 1 TABLET BY MOUTH ONCE DAILY 11/22/17   Yevonne Pax, MD  PULMICORT [facility] 180 MCG/ACT inhaler  06/06/16   [provider]  raloxifene (EVISTA) 60 MG tablet TAKE 1 TABLET BY  MOUTH ONCE DAILY 09/27/17   Ellyn Hack, MD  VENTOLIN HFA 108 (90 BASE) MCG/ACT inhaler  05/08/15   [provider]  Vitamin D, Ergocalciferol, (DRISDOL) 50000 units CAPS capsule Take 1 capsule (50,000 Units total) by mouth every 7 (seven) days. 09/12/17   Ellyn Hack, MD  vitamin E 400 UNIT capsule Take 400 Units by mouth daily.    [provider]      VITAL SIGNS:  Blood pressure (!) 118/52, pulse (!) 103, temperature 98.7 F (37.1 C), temperature source Oral, SpO2 (!) 88 %.  PHYSICAL EXAMINATION:  Physical Exam  GENERAL:  77 y.o.-year-old patient lying in the bed in mild Resp. Distress.  EYES: Pupils equal, round, reactive to light and accommodation. No scleral icterus. Extraocular muscles intact.  HEENT: Head atraumatic, normocephalic. Oropharynx and nasopharynx clear. No oropharyngeal erythema, moist oral mucosa  NECK:  Supple, no jugular venous distention. No thyroid enlargement, no tenderness.  LUNGS: Normal  breath sounds bilaterally, diffuse end-exp wheezing b/l, No rales, rhonchi. + use of accessory muscles of respiration.  CARDIOVASCULAR: S1, S2 RRR. No murmurs, rubs, gallops, clicks.  ABDOMEN: Soft, nontender, nondistended. Bowel sounds present. No organomegaly or mass.  EXTREMITIES: No pedal edema, cyanosis, or clubbing. + 2 pedal & radial pulses b/l.   NEUROLOGIC: Cranial nerves II through XII are intact. No focal Motor or sensory deficits appreciated b/l PSYCHIATRIC: The patient is alert and oriented x 3.  SKIN: No obvious rash, lesion, or ulcer.   LABORATORY PANEL:   CBC No results for input(s): WBC, HGB, HCT, PLT in the last 168 hours. ------------------------------------------------------------------------------------------------------------------  Chemistries  No results for input(s): NA, K, CL, CO2, GLUCOSE, BUN, CREATININE, CALCIUM, MG, AST, ALT, ALKPHOS, BILITOT in the last 168 hours.  Invalid input(s):  GFRCGP ------------------------------------------------------------------------------------------------------------------  Cardiac Enzymes No results for input(s): TROPONINI in the last 168 hours. ------------------------------------------------------------------------------------------------------------------  RADIOLOGY:  Dg Chest Port 1 View  Result Date: 12/07/2017 CLINICAL DATA:  Shortness of breath. EXAM: PORTABLE CHEST 1 VIEW COMPARISON:  Radiograph of March 28, 2017. FINDINGS: Stable cardiomegaly. No pneumothorax or pleural effusion is noted. Both lungs are clear. The visualized skeletal structures are unremarkable. IMPRESSION: No acute cardiopulmonary abnormality seen. Electronically Signed   By: Lupita Raider, M.D.   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  1. Acute on chronic respiratory failure with hypoxia-this is secondary to COPD exacerbation. -Continue O2 supplementation, we'll treat the patient with IV steroids, scheduled DuoNeb's, Pulmicort nebs.  2. COPD exacerbation-likely secondary to bronchitis. Chest x-ray showing no evidence of acute pneumonia. -We'll treat patient with IV steroids, scheduled DuoNeb nebs, Pulmicort nebs. -Patient follows up with Dr. Welton Flakes from Pulmonary.   3. 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 Follow blood sugars.  4. Hypothyroidism-continue Synthroid.  5. Essential hypertension-continue losartan, Norvasc.  6. Anxiety-continue Xanax as needed.  7. Hyperlipidemia-continue Pravachol.  8. History of osteoporosis-continue raloxifene.    All the records are reviewed and case discussed with ED provider. Management plans discussed with the patient,  family and they are in agreement.  CODE STATUS: DNR  TOTAL TIME TAKING CARE OF THIS PATIENT: 45 minutes.    Houston Siren M.D on 12/07/2017 at 2:47 PM  Between 7am to 6pm - Pager - [phone]  After 6pm go to [email or web address] - password [email or web address] [facility]  [facility]  Office  [phone]  CC: Primary care physician; Ellyn Hack, MD

## 2017-12-07 NOTE — Plan of Care (Signed)
  Not Progressing Clinical Measurements: Respiratory complications will improve 12/07/2017 2042 - Not Progressing by Kalman JewelsBallentine, Jmya Uliano, RN Activity: Risk for activity intolerance will decrease 12/07/2017 2042 - Not Progressing by Kalman JewelsBallentine, Shawnay Bramel, RN Activity: Ability to tolerate increased activity will improve 12/07/2017 2042 - Not Progressing by Kalman JewelsBallentine, Mercer Peifer, RN  Patient dyspneic at rest, not tolerating activity.

## 2017-12-07 NOTE — Progress Notes (Signed)
Patient was a direct admit this afternoon, brought to room by Tammy RT with difficulty breathing. Son at bedside. Rounded in room with Dr. Cherlynn KaiserSainani. Patient seems to be feeling quite a bit better at this time. No complaints currently. Will continue to monitor.

## 2017-12-07 NOTE — Patient Instructions (Signed)

## 2017-12-07 NOTE — Progress Notes (Signed)
[facility] [address] [place], [place] [address]  Pulmonary Sleep Medicine  Office Visit Note  Patient Name: Karen [name] DOB: 06/29/1941 MRN [id number]  Date of Service: 12/07/2017     Complaints/HPI:  She has been ill for a few days.  She comes in now with cough and congestion.  Patient states that she has had increasing in her sputum production.  Denies having any chest pain.  Has felt fevers.  Son states that she is more short of breath and is not able to get around.  They have a pulse oximeter which is been measuring about 80% at home.  This is despite her being on oxygen.  On evaluation she is visibly short of breath and having active wheezing at this time.  Current Medication: Outpatient Encounter Medications as of 12/07/2017  Medication Sig Note  . ALPRAZolam (XANAX) 0.25 MG tablet Take 1 tablet (0.25 mg total) by mouth 2 (two) times daily as needed for anxiety. (Patient not taking: Reported on 10/20/2017)   . amLODipine (NORVASC) 5 MG tablet TAKE 1 TABLET BY MOUTH ONCE DAILY   . B-D INS SYRINGE 0.5CC/31GX5/16 31G X 5/16" 0.5 ML MISC USE TO INJECT INSULIN TWICE DAILY.   Marland Kitchen clopidogrel (PLAVIX) 75 MG tablet TAKE 1 TABLET BY MOUTH ONCE DAILY   . digoxin (LANOXIN) 0.125 MG tablet Take 0.125 mg by mouth every other day.    . diphenhydrAMINE (BENADRYL) 25 mg capsule Take 1 capsule (25 mg total) by mouth 2 (two) times daily as needed for itching or allergies.   . ferrous sulfate 325 (65 FE) MG EC tablet Take 325 mg by mouth every other day.    . fluconazole (DIFLUCAN) 150 MG tablet Take 1 tablet (150 mg total) daily by mouth.   . furosemide (LASIX) 40 MG tablet    . HUMULIN 70/30 (70-30) 100 UNIT/ML injection INJECT 50 UNITS SUBQ ONCE EVERY MORNING AND 27 UNITS EVERY EVENING   . insulin lispro (HUMALOG KWIKPEN) 100 UNIT/ML KiwkPen Use as directed according to Sliding Scale.   . insulin NPH-regular Human (HUMULIN 70/30) (70-30) 100 UNIT/ML injection INJECT 50 UNITS  SUBCUTANEOUSLY ONCE IN THE MORNING AND 27 UNITS EVERYEVENING   . Insulin Pen Needle (UNIFINE PENTIPS) 31G X 6 MM MISC USE AS DIRECTED   . ipratropium-albuterol (DUONEB) 0.5-2.5 (3) MG/3ML SOLN USE 1 VIAL VIA NABULIZER EVERY 6 HOURS AS NEEDED   . ketoconazole (NIZORAL) 2 % cream Apply 1 application 2 (two) times daily as needed topically for irritation.   Marland Kitchen levothyroxine (SYNTHROID, LEVOTHROID) 150 MCG tablet Take 1 tablet (150 mcg total) daily before breakfast by mouth.   . losartan (COZAAR) 100 MG tablet TAKE 1 TABLET BY MOUTH ONCE DAILY   . pantoprazole (PROTONIX) 40 MG tablet TAKE 1 TABLET BY MOUTH ONCE DAILY   . pravastatin (PRAVACHOL) 40 MG tablet Take 1 tablet (40 mg total) by mouth daily.   . predniSONE (DELTASONE) 10 MG tablet Take 1 tablet by mouth daily.   . predniSONE (DELTASONE) 10 MG tablet TAKE 1 TABLET BY MOUTH ONCE DAILY   . PULMICORT FLEXHALER 180 MCG/ACT inhaler  03/28/2017: Pt has but doesn't use   . raloxifene (EVISTA) 60 MG tablet TAKE 1 TABLET BY MOUTH ONCE DAILY   . VENTOLIN HFA 108 (90 BASE) MCG/ACT inhaler    . Vitamin D, Ergocalciferol, (DRISDOL) 50000 units CAPS capsule Take 1 capsule (50,000 Units total) by mouth every 7 (seven) days.   . vitamin E 400 UNIT capsule Take  400 Units by mouth daily.    No facility-administered encounter medications on file as of 12/07/2017.     Surgical History: Past Surgical History:  Procedure Laterality Date  . CHOLECYSTECTOMY  1993    Medical History: Past Medical History:  Diagnosis Date  . COPD (chronic obstructive pulmonary disease) (HCC)   . Diabetes mellitus without complication (HCC)   . Hyperlipidemia   . Hypertension     Family History: Family History  Problem Relation Age of Onset  . Heart disease Mother   . Heart disease Father   . Heart disease Sister   . Dementia Sister   . Heart disease Brother   . Diabetes Paternal Grandmother     Social History: Social History   Socioeconomic History  . Marital  status: Single    Spouse name: Not on file  . Number of children: Not on file  . Years of education: Not on file  . Highest education level: Not on file  Social Needs  . Financial resource strain: Not on file  . Food insecurity - worry: Not on file  . Food insecurity - inability: Not on file  . Transportation needs - medical: Not on file  . Transportation needs - non-medical: Not on file  Occupational History  . Not on file  Tobacco Use  . Smoking status: Never Smoker  . Smokeless tobacco: Never Used  Substance and Sexual Activity  . Alcohol use: No    Alcohol/week: 0.0 oz  . Drug use: No  . Sexual activity: No  Other Topics Concern  . Not on file  Social History Narrative  . Not on file     ROS  General: (-) fever, (-) chills, (-) night sweats, +weakness, poor appetite. Skin: + rashes, (-) itching,. Eyes: (-) visual changes, (-) redness, (-) double or blurred vision. Nose and Sinuses: (-) nasal stuffiness or itchiness, (-) postnasal drip, (-) nosebleeds, (-) sinus trouble. Mouth and Throat: (-) sore throat, (-) hoarseness. Neck: (-) swollen glands, (-) enlarged thyroid, (-) neck pain. Respiratory: + cough, (-) bloody sputum, +shortness of breath, +wheezing. Cardiovascular: trace ankle swelling, (-) chest pain. Lymphatic: (-) lymph node enlargement, (-) lymph node tenderness. Neurologic: (-) numbness, (-) tingling,(-) dizziness. Psychiatric: (-) anxiety, (-) depression.  Vital Signs: Blood pressure 135/61, pulse (!) 107, temperature 99.5 F (37.5 C), height 4\' 11"  (1.499 m), weight 185 lb 3.2 oz (84 kg), SpO2 (!) 82 %.  Examination: General Appearance: The patient is well-developed, well-nourished, and in moderate distress. Skin: Gross inspection of skin demonstrates no evidence of abnormality. Head: Patient's head is normocephalic, no gross deformities. Eyes: no gross deformities noted. ENT: ears appear grossly normal. Nasopharynx appears to be normal. Neck:  Supple. No thyromegaly. No LAD. Respiratory: Lungs bilateral rhonchi. Cardiovascular: Normal S1 and S2 without murmur or rub. Extremities: No cyanosis. pulses are equal. Neurologic: Alert and oriented  LABS: Recent Results (from the past 2160 hour(s))  POCT glycosylated hemoglobin (Hb A1C)     Status: Abnormal   Collection Time: 09/11/17  3:20 PM  Result Value Ref Range   Hemoglobin A1C 9.2   CBC with Differential/Platelet     Status: Abnormal   Collection Time: 09/11/17  4:12 PM  Result Value Ref Range   WBC 8.2 3.8 - 10.8 Thousand/uL   RBC 4.30 3.80 - 5.10 Million/uL   Hemoglobin 12.2 11.7 - 15.5 g/dL   HCT 10.238.3 72.535.0 - 36.645.0 %   MCV 89.1 80.0 - 100.0 fL   MCH 28.4 27.0 -  33.0 pg   MCHC 31.9 (L) 32.0 - 36.0 g/dL   RDW 16.1 09.6 - 04.5 %   Platelets 177 140 - 400 Thousand/uL   MPV 11.5 7.5 - 12.5 fL   Neutro Abs 6,642 1,500 - 7,800 cells/uL   Lymphs Abs 1,033 850 - 3,900 cells/uL   WBC mixed population 353 200 - 950 cells/uL   Eosinophils Absolute 98 15 - 500 cells/uL   Basophils Absolute 74 0 - 200 cells/uL   Neutrophils Relative % 81 %   Total Lymphocyte 12.6 %   Monocytes Relative 4.3 %   Eosinophils Relative 1.2 %   Basophils Relative 0.9 %  TSH     Status: Abnormal   Collection Time: 09/11/17  4:12 PM  Result Value Ref Range   TSH 0.27 (L) 0.40 - 4.50 mIU/L  VITAMIN D 25 Hydroxy (Vit-D Deficiency, Fractures)     Status: Abnormal   Collection Time: 09/11/17  4:12 PM  Result Value Ref Range   Vit D, 25-Hydroxy 8 (L) 30 - 100 ng/mL    Comment: Vitamin D Status         25-OH Vitamin D: . Deficiency:                    <20 ng/mL Insufficiency:             20 - 29 ng/mL Optimal:                 > or = 30 ng/mL . For 25-OH Vitamin D testing on patients on  D2-supplementation and patients for whom quantitation  of D2 and D3 fractions is required, the QuestAssureD(TM) 25-OH VIT D, (D2,D3), LC/MS/MS is recommended: order  code 40981 (patients >30yrs). . For more  information on this test, go to: http://education.questdiagnostics.com/faq/FAQ163 (This link is being provided for  informational/educational purposes only.)   COMPLETE METABOLIC PANEL WITH GFR     Status: Abnormal   Collection Time: 09/11/17  4:12 PM  Result Value Ref Range   Glucose, Bld 335 (H) 65 - 139 mg/dL    Comment: .        Non-fasting reference interval .    BUN 23 7 - 25 mg/dL   Creat 1.91 (H) 4.78 - 0.93 mg/dL    Comment: For patients >53 years of age, the reference limit for Creatinine is approximately 13% higher for people identified as African-American. .    GFR, Est Non African American 48 (L) > OR = 60 mL/min/1.23m2   GFR, Est African American 55 (L) > OR = 60 mL/min/1.37m2   BUN/Creatinine Ratio 21 6 - 22 (calc)   Sodium 141 135 - 146 mmol/L   Potassium 4.5 3.5 - 5.3 mmol/L   Chloride 101 98 - 110 mmol/L   CO2 31 20 - 32 mmol/L   Calcium 8.8 8.6 - 10.4 mg/dL   Total Protein 6.1 6.1 - 8.1 g/dL   Albumin 3.8 3.6 - 5.1 g/dL   Globulin 2.3 1.9 - 3.7 g/dL (calc)   AG Ratio 1.7 1.0 - 2.5 (calc)   Total Bilirubin 0.4 0.2 - 1.2 mg/dL   Alkaline phosphatase (APISO) 57 33 - 130 U/L   AST 13 10 - 35 U/L   ALT 14 6 - 29 U/L  TSH     Status: None   Collection Time: 10/18/17  3:25 PM  Result Value Ref Range   TSH 0.51 0.40 - 4.50 mIU/L  T4     Status: None  Collection Time: 10/18/17  3:25 PM  Result Value Ref Range   T4, Total 9.0 5.1 - 11.9 mcg/dL  T3     Status: None   Collection Time: 10/18/17  3:25 PM  Result Value Ref Range   T3, Total 76 76 - 181 ng/dL    Radiology: Dg Bone Density  Result Date: 10/18/2017 EXAM: DUAL X-RAY ABSORPTIOMETRY (DXA) FOR BONE MINERAL DENSITY IMPRESSION: Dear Dr. Sherryll Burger, Your patient Nikeya Maxim completed a BMD test on 10/18/2017 using the Lunar iDXA DXA System (analysis version: 14.10) manufactured by [employer]. The following summarizes the results of our evaluation. PATIENT BIOGRAPHICAL: Name: Shaneece, Stockburger Patient ID:  [id number] Birth Date: [birth year] Height: 59.0 in. Gender: Female Exam Date: 10/18/2017 Weight: 189.2 lbs. Indications: Advanced Age, Asthma, Caucasian, Chronic Glucocorticoids, COPD, Diabetic, High Risk Meds, Postmenopausal Fractures: Treatments: Albuterol, Calcium, Evista, Insulin, Levothyroxine, Prednisone, Ventolin HFA, Vitamin D ASSESSMENT: The BMD measured at Femur Neck Left is 0.405 g/cm2 with a T-score of -4.6. This patient is considered osteoporotic according to World Health Organization [facility]) criteria. Lumbar spine was not utilized due to advanced degenerative changes. Site Region Measured Measured WHO Young Adult BMD Date       Age      Classification T-score DualFemur Neck Left 10/18/2017 76.6 Osteoporosis -4.6 0.405 g/cm2 Left Forearm Radius 33% 10/18/2017 76.6 Osteoporosis -3.3 0.584 g/cm2 World Health Organization [facility]) criteria for post-menopausal, Caucasian Women: Normal:       T-score at or above -1 SD Osteopenia:   T-score between -1 and -2.5 SD Osteoporosis: T-score at or below -2.5 SD RECOMMENDATIONS: National Osteoporosis Foundation recommends that FDA-approved medical therapies be considered in postmenopausal women and men age 30 or older with a: 1. Hip or vertebral (clinical or morphometric) fracture. 2. T-score of < -2.5 at the spine or hip. 3. Ten-year fracture probability by FRAX of 3% or greater for hip fracture or 20% or greater for major osteoporotic fracture. All treatment decisions require clinical judgment and consideration of individual patient factors, including patient preferences, co-morbidities, previous drug use, risk factors not captured in the FRAX model (e.g. falls, vitamin D deficiency, increased bone turnover, interval significant decline in bone density) and possible under - or over-estimation of fracture risk by FRAX. All patients should ensure an adequate intake of dietary calcium (1200 mg/d) and vitamin D (800 IU daily) unless contraindicated. FOLLOW-UP: People with  diagnosed cases of osteoporosis or at high risk for fracture should have regular bone mineral density tests. For patients eligible for Medicare, routine testing is allowed once every 2 years. The testing frequency can be increased to one year for patients who have rapidly progressing disease, those who are receiving or discontinuing medical therapy to restore bone mass, or have additional risk factors. I have reviewed this report, and agree with the above findings. [facility] Radiology Electronically Signed   By: Elberta Fortis M.D.   On: 10/18/2017 15:01    No results found.  No results found.    Assessment and Plan: Patient Active Problem List   Diagnosis Date Noted  . History of PSVT (paroxysmal supraventricular tachycardia) 10/20/2017  . Sprain of ankle 10/20/2017  . Osteoporosis 08/22/2017  . COPD (chronic obstructive pulmonary disease) (HCC) 03/28/2017  . Swelling of limb 09/27/2016  . Lower limb ulcer, calf, left, limited to breakdown of skin (HCC) 09/27/2016  . Encounter related to need for assistance with personal care 04/13/2016  . Personal history of other diseases of the circulatory system 11/09/2015  . Easy bruising  10/20/2015  . COPD exacerbation (HCC) 10/19/2015  . Atrial fibrillation (HCC) 10/19/2015  . Diabetes mellitus (HCC) 10/19/2015  . 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 Proteinuria due to type 2 diabetes mellitus (HCC) 06/26/2015  . Morbid obesity due to excess calories (HCC) 06/26/2015  . Obesity, Class II, BMI 35-39.9 06/26/2015  . On continuous oral anticoagulation 06/26/2015  . Asthma with COPD (HCC) 06/26/2015  . Hyperlipidemia 06/26/2015  . Obstructive sleep apnea 06/26/2015  . CAFL (chronic airflow limitation) (HCC) 06/01/2015  . A-fib (HCC) 06/01/2015  . Diabetes (HCC) 06/01/2015  . History of prolonged Q-T interval on ECG 06/01/2015  . HLD (hyperlipidemia) 06/01/2015  . BP (high blood pressure) 06/01/2015  .  Obstructive apnea 06/01/2015    1. Acute respiratory failure with hypoxia   She needs to be admitted to the hospital due to severe hypoxia She is not responding to oxygen therapy at this time  She will be admitted for IV steroids and antibiotics  2. COPD with exacerbation  as noted above acute exacerbation she will be admitted to the hospital will discuss with the hospitalist service  3. Chronic Atrial Fibrillation  her rate is currently controlled will continue with present medications  4. Shortness of breath  severely increased shortness of breath secondary to 1.  5. Oxygen dependent  continue with oxygen therapy right now she is severely hypoxic secondary to acute flare over COPD   General Counseling: I have discussed the findings of the evaluation and examination with Ethyle.  I have also discussed any further diagnostic evaluation thatmay be needed or ordered today. Nayleen verbalizes understanding of the findings of todays visit. We also reviewed her medications today and discussed drug interactions and side effects including but not limited excessive drowsiness and altered mental states. We also discussed that there is always a risk not just to her but also people around her. she has been encouraged to call the office with any questions or concerns that should arise related to todays visit.    Time spent:  I have personally obtained a history, examined the patient, evaluated laboratory and imaging results, formulated the assessment and plan and placed orders.    Yevonne Pax, MD [facility] Pulmonary and Critical Care Sleep medicine

## 2017-12-07 NOTE — Progress Notes (Signed)
CBG 229 per [place] NT. Glucometer not synched yet.

## 2017-12-07 NOTE — Care Management Note (Signed)
Case Management Note  Patient Details  Name: Whitney Posttta M Bille MRN: [id number] Date of Birth: 1941-07-08  Subjective/Objective:                 Patient admitted from home with exac of copd.  Chronic oxygen. has home nebulizer.  Has had [facility] in the past.  She is current with her pcp Dr Brayton ElSyed Shah and [facility]  No issues with transportation or obtaining meds.   Action/Plan:  Anticipate Home health nurse at discharge Expected Discharge Date:                  Expected Discharge Plan:     In-House Referral:     Discharge planning Services     Post Acute Care Choice:    Choice offered to:     DME Arranged:    DME Agency:     HH Arranged:    HH Agency:     Status of Service:     If discussed at [employer] Meetings, dates discussed:    Additional Comments:  Eber HongGreene, Carie Kapuscinski R, RN 12/07/2017, 4:54 PM

## 2017-12-08 DIAGNOSIS — J441 Chronic obstructive pulmonary disease with (acute) exacerbation: Secondary | ICD-10-CM | POA: Diagnosis not present

## 2017-12-08 DIAGNOSIS — R0602 Shortness of breath: Secondary | ICD-10-CM | POA: Diagnosis not present

## 2017-12-08 LAB — GLUCOSE, CAPILLARY
GLUCOSE-CAPILLARY: 284 mg/dL — AB (ref 65–99)
Glucose-Capillary: 309 mg/dL — ABNORMAL HIGH (ref 65–99)
Glucose-Capillary: 246 mg/dL — ABNORMAL HIGH (ref 65–99)
Glucose-Capillary: 180 mg/dL — ABNORMAL HIGH (ref 65–99)

## 2017-12-08 MED ORDER — COLLAGENASE 250 UNIT/GM EX OINT
TOPICAL_OINTMENT | Freq: Every day | CUTANEOUS | Status: DC
  Administered 2017-12-08 – 2017-12-13 (×6): via TOPICAL
  Filled 2017-12-08: qty 30

## 2017-12-08 MED ORDER — SODIUM CHLORIDE 0.9% FLUSH
3.0000 mL | Freq: Two times a day (BID) | INTRAVENOUS | Status: DC
  Administered 2017-12-08 – 2017-12-13 (×11): 3 mL via INTRAVENOUS

## 2017-12-08 NOTE — Plan of Care (Signed)
Pt is A&Ox4. VSS. 2L O2 [place] continued. OOB with assist. Family at bedside. RLE dressing changed this shift per order. No complaints thus far. Will continue to monitor and report to oncoming RN .  Progressing Education: Knowledge of General Education information will improve 12/08/2017 1527 - Progressing by Jodie EchevariaWhite, Delayne Sanzo L, RN Health Behavior/Discharge Planning: Ability to manage health-related needs will improve 12/08/2017 1527 - Progressing by Jodie EchevariaWhite, Patrica Mendell L, RN Clinical Measurements: Ability to maintain clinical measurements within normal limits will improve 12/08/2017 1527 - Progressing by Jodie EchevariaWhite, Yoshika Vensel L, RN Will remain free from infection 12/08/2017 1527 - Progressing by Jodie EchevariaWhite, Jasreet Dickie L, RN Diagnostic test results will improve 12/08/2017 1527 - Progressing by Jodie EchevariaWhite, Kaya Pottenger L, RN Respiratory complications will improve 12/08/2017 1527 - Progressing by Jodie EchevariaWhite, Hanh Kertesz L, RN Cardiovascular complication will be avoided 12/08/2017 1527 - Progressing by Jodie EchevariaWhite, Abrina Petz L, RN Activity: Risk for activity intolerance will decrease 12/08/2017 1527 - Progressing by Jodie EchevariaWhite, Tressy Kunzman L, RN Nutrition: Adequate nutrition will be maintained 12/08/2017 1527 - Progressing by Jodie EchevariaWhite, Krue Peterka L, RN Coping: Level of anxiety will decrease 12/08/2017 1527 - Progressing by Jodie EchevariaWhite, Rayjon Wery L, RN Elimination: Will not experience complications related to bowel motility 12/08/2017 1527 - Progressing by Jodie EchevariaWhite, Dagoberto Nealy L, RN Will not experience complications related to urinary retention 12/08/2017 1527 - Progressing by Jodie EchevariaWhite, Dreux Mcgroarty L, RN Pain Managment: General experience of comfort will improve 12/08/2017 1527 - Progressing by Jodie EchevariaWhite, Nelta Caudill L, RN Safety: Ability to remain free from injury will improve 12/08/2017 1527 - Progressing by Jodie EchevariaWhite, Finnick Orosz L, RN Skin Integrity: Risk for impaired skin integrity will decrease 12/08/2017 1527 - Progressing by Jodie EchevariaWhite, Domingue Coltrain L, RN Education: Knowledge of disease or condition will improve 12/08/2017 1527 - Progressing by Jodie EchevariaWhite, Adalei Novell  L, RN Knowledge of the prescribed therapeutic regimen will improve 12/08/2017 1527 - Progressing by Jodie EchevariaWhite, Ayodele Hartsock L, RN Activity: Ability to tolerate increased activity will improve 12/08/2017 1527 - Progressing by Jodie EchevariaWhite, Archer Moist L, RN Will verbalize the importance of balancing activity with adequate rest periods 12/08/2017 1527 - Progressing by Jodie EchevariaWhite, Keymani Glynn L, RN Respiratory: Ability to maintain a clear airway will improve 12/08/2017 1527 - Progressing by Jodie EchevariaWhite, App[place] Ackert L, RN Levels of oxygenation will improve 12/08/2017 1527 - Progressing by Jodie EchevariaWhite, Wendee Hata L, RN Ability to maintain adequate ventilation will improve 12/08/2017 1527 - Progressing by Jodie EchevariaWhite, Ariaunna Longsworth L, RN

## 2017-12-08 NOTE — Consult Note (Signed)
WOC Nurse wound consult note Reason for Consult:Nonhealing wound to right posterior calf, present on admission.  Has been treating at home with daily gauze dressing.  Wound type:Chronic nonhealing.  Has compression hose she wears at home.  Minimal edema noted today.  Pressure Injury POA: NA Measurement: 0.6 cm x 0.2 cm  Wound bed:100% adherent slough Drainage (amount, consistency, odor) minimal serosanguinous  No odor Periwound:intact Dressing procedure/placement/frequency:Cleanse wound to right posterior calf with NS.  Apply Santyl to wound bed.  COver with dry dressing.  Change daily.  Will not follow at this time.  Please re-consult if needed.  Maple HudsonKaren Sharryn Belding RN BSN CWON Pager [phone]

## 2017-12-08 NOTE — Progress Notes (Signed)
[facility] - [place] at [facility]   PATIENT NAME: Karen [name]    MR#:  [id number]  DATE OF BIRTH:  25-Jun-1941  SUBJECTIVE:  Breath and productive phlegm.  Patient states she feels better.  Uses chronic oxygen.  Family in the room.  REVIEW OF SYSTEMS:   Review of Systems  Constitutional: Negative for chills, fever and weight loss.  HENT: Negative for ear discharge, ear pain and nosebleeds.   Eyes: Negative for blurred vision, pain and discharge.  Respiratory: Positive for cough. Negative for sputum production, shortness of breath, wheezing and stridor.   Cardiovascular: Negative for chest pain, palpitations, orthopnea and PND.  Gastrointestinal: Negative for abdominal pain, diarrhea, nausea and vomiting.  Genitourinary: Negative for frequency and urgency.  Musculoskeletal: Negative for back pain and joint pain.  Neurological: Positive for weakness. Negative for sensory change, speech change and focal weakness.  Psychiatric/Behavioral: Negative for depression and hallucinations. The patient is not nervous/anxious.    Tolerating Diet:yes Tolerating PT: not needed. At baseline uses walker at home.  Per family no fall balance is okay.  They do not feel PT is needed at present.  DRUG ALLERGIES:   Allergies  Allergen Reactions  . Ace Inhibitors Other (See Comments)    Cough   . Singulair [Montelukast Sodium]     VITALS:  Blood pressure 127/71, pulse 91, temperature 97.7 F (36.5 C), temperature source Oral, resp. rate 20, height 4\' 11"  (1.499 m), weight 81.2 kg (179 lb 1.6 oz), SpO2 100 %.  PHYSICAL EXAMINATION:   Physical Exam  GENERAL:  77 y.o.-year-old patient lying in the bed with no acute distress.  EYES: Pupils equal, round, reactive to light and accommodation. No scleral icterus. Extraocular muscles intact.  HEENT: Head atraumatic, normocephalic. Oropharynx and nasopharynx clear.  NECK:  Supple, no jugular venous distention. No thyroid  enlargement, no tenderness.  LUNGS: distant breath sounds bilaterally, no wheezing, rales, rhonchi. No use of accessory muscles of respiration.  CARDIOVASCULAR: S1, S2 normal. No murmurs, rubs, or gallops.  ABDOMEN: Soft, nontender, nondistended. Bowel sounds present. No organomegaly or mass.  EXTREMITIES: No cyanosis, clubbing or edema b/l.    NEUROLOGIC: Cranial nerves II through XII are intact. No focal Motor or sensory deficits b/l.   PSYCHIATRIC:  patient is alert and oriented x 3.  SKIN: No obvious rash, lesion, or ulcer.   LABORATORY PANEL:  CBC Recent Labs  Lab 12/07/17 1509  WBC 8.7  HGB 11.4*  HCT 35.5  PLT 191    Chemistries  Recent Labs  Lab 12/07/17 1509  NA 139  K 4.1  CL 99*  CO2 31  GLUCOSE 230*  BUN 28*  CREATININE 1.21*  CALCIUM 8.5*   Cardiac Enzymes No results for input(s): TROPONINI in the last 168 hours. RADIOLOGY:  Dg Chest Port 1 View  Result Date: 12/07/2017 CLINICAL DATA:  Shortness of breath. EXAM: PORTABLE CHEST 1 VIEW COMPARISON:  Radiograph of March 28, 2017. FINDINGS: Stable cardiomegaly. No pneumothorax or pleural effusion is noted. Both lungs are clear. The visualized skeletal structures are unremarkable. IMPRESSION: No acute cardiopulmonary abnormality seen. Electronically Signed   By: Lupita Raider, M.D.   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  1. Acute on chronic respiratory failure with hypoxia-this is secondary to COPD exacerbation. -Continue O2  supplementation, we'll treat the patient with IV steroids, scheduled DuoNeb's, Pulmicort nebs.  2. COPD exacerbation-likely secondary to bronchitis. Chest x-ray showing no evidence of acute pneumonia. -We'll treat patient with IV steroids, scheduled DuoNeb nebs,  Pulmicort nebs. -Patient follows up with Dr. Welton FlakesKhan from Pulmonary.   3. 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 Follow blood sugars.  4. Hypothyroidism-continue Synthroid.  5. Essential hypertension-continue losartan, Norvasc.  6. Anxiety-continue Xanax as needed.  7. Hyperlipidemia-continue Pravachol.  8. History of osteoporosis-continue raloxifene.   Overall appears stable.  We will keep her 1 more day.  She will discharged home tomorrow if remains stable from a respiratory standpoint.    Case discussed with Care Management/Social Worker. Management plans discussed with the patient, family and they are in agreement.  CODE STATUS:DNR  DVT Prophylaxis: lovenox  TOTAL TIME TAKING CARE OF THIS PATIENT: 40 minutes.  >50% time spent on counselling and coordination of care  POSSIBLE D/C IN 1-2 DAYS, DEPENDING ON CLINICAL CONDITION.  Note: This dictation was prepared with Dragon dictation along with smaller phrase technology. Any transcriptional errors that result from this process are unintentional.  Enedina FinnerSona Robertson Colclough M.D on 12/08/2017 at 1:57 PM  Between 7am to 6pm - Pager - [phone]  After 6pm go to [email or web address] - Social research officer, governmentpassword EPAS [facility]  [facility]  Office  [phone]  CC: Primary care physician; Ellyn HackShah, Syed Asad A, MDPatient ID: Whitney PostEtta M Shen, female   DOB: 13-Nov-1941, 77 y.o.   MRN: [id number]

## 2017-12-09 DIAGNOSIS — J441 Chronic obstructive pulmonary disease with (acute) exacerbation: Secondary | ICD-10-CM | POA: Diagnosis not present

## 2017-12-09 DIAGNOSIS — R0602 Shortness of breath: Secondary | ICD-10-CM | POA: Diagnosis not present

## 2017-12-09 LAB — GLUCOSE, CAPILLARY
GLUCOSE-CAPILLARY: 270 mg/dL — AB (ref 65–99)
GLUCOSE-CAPILLARY: 250 mg/dL — AB (ref 65–99)
GLUCOSE-CAPILLARY: 185 mg/dL — AB (ref 65–99)
Glucose-Capillary: 288 mg/dL — ABNORMAL HIGH (ref 65–99)

## 2017-12-09 MED ORDER — METHYLPREDNISOLONE SODIUM SUCC 40 MG IJ SOLR
40.0000 mg | Freq: Every day | INTRAMUSCULAR | Status: DC
  Administered 2017-12-10: 40 mg via INTRAVENOUS
  Filled 2017-12-09 (×2): qty 1

## 2017-12-09 NOTE — Progress Notes (Signed)
Voicemail left for Wayne HeightsLynn, CM [facility] Renae GlossWieting also notified by this RN via text page.

## 2017-12-09 NOTE — Evaluation (Signed)
Physical Therapy Evaluation Patient Details Name: Karen [name] MRN: [id number] DOB: July 05, 1941 Today's Date: 12/09/2017   History of Present Illness  77 yo female home alone and has SOB, bronchitis, home O2 use, hypoxic with COPD exacerbation.  PMHx: COPD, DM, HTN, HLD, chronic respiratory failure on O2, hypothyroidism, osteoporosis, anxiety  Clinical Impression  Pt is noted to have O2 sats at 92% or greater during the treatment with 3L O2 via nasal cannula, very motivated to walk to BR but line was short.  Used BSC to get her finished with voiding and then back to her bed with no significant decline in O2 sats.  However was weak and needs help for all mobility as well as having very low endurance to manage at home.  Thinks she can get a sibling to stay with her but if not will need SNF.  Recommending for now until the plan is in place for assistance.  Will be a good candidate for ALF or SNF afterward given her breathing issues and the expectations for recovery from current level.    Follow Up Recommendations SNF    Equipment Recommendations  None recommended by PT    Recommendations for Other Services       Precautions / Restrictions Precautions Precautions: Fall Precaution Comments: O2 continual need Restrictions Weight Bearing Restrictions: No      Mobility  Bed Mobility Overal bed mobility: Needs Assistance Bed Mobility: Supine to Sit;Sit to Supine     Supine to sit: Mod assist Sit to supine: Min assist;Mod assist   General bed mobility comments: requires dense cues for sequencing OOB, reminders for body mechanics  Transfers Overall transfer level: Needs assistance Equipment used: Rolling walker (2 wheeled);1 person hand held assist Transfers: Sit to/from Stand Sit to Stand: Min assist;Mod assist         General transfer comment: minimal help to power up and once up is min guard to min assist  Ambulation/Gait Ambulation/Gait assistance: Min guard;Min  assist Ambulation Distance (Feet): 30 Feet Assistive device: Rolling walker (2 wheeled);1 person hand held assist Gait Pattern/deviations: Step-to pattern;Step-through pattern;Trunk flexed;Wide base of support;Shuffle;Decreased stride length Gait velocity: reduced Gait velocity interpretation: Below normal speed for age/gender General Gait Details: using longer O2 line for maintenance of O2 sats  Stairs            Wheelchair Mobility    Modified Rankin (Stroke Patients Only)       Balance Overall balance assessment: Needs assistance Sitting-balance support: Bilateral upper extremity supported;Feet supported Sitting balance-Leahy Scale: Fair     Standing balance support: Bilateral upper extremity supported;During functional activity Standing balance-Leahy Scale: Fair                               Pertinent Vitals/Pain Pain Assessment: No/denies pain    Home Living Family/patient expects to be discharged to:: Private residence Living Arrangements: Alone Available Help at Discharge: Family;Available PRN/intermittently Type of Home: House Home Access: Level entry     Home Layout: One level Home Equipment: Walker - 4 wheels;Cane - single point(family reports walker is not safe with so much play in it)      Prior Function Level of Independence: Independent with assistive device(s)         Comments: has been using furniture to hold to walk at times     Hand Dominance        Extremity/Trunk Assessment   Upper Extremity Assessment Upper  Extremity Assessment: Generalized weakness    Lower Extremity Assessment Lower Extremity Assessment: Generalized weakness    Cervical / Trunk Assessment Cervical / Trunk Assessment: Kyphotic  Communication   Communication: HOH  Cognition Arousal/Alertness: Awake/alert Behavior During Therapy: WFL for tasks assessed/performed Overall Cognitive Status: Within Functional Limits for tasks assessed                                         General Comments General comments (skin integrity, edema, etc.): less than fair balance dynamically but is comfortable with RW to steady herself    Exercises     Assessment/Plan    PT Assessment Patient needs continued PT services  PT Problem List Decreased strength;Decreased range of motion;Decreased activity tolerance;Decreased balance;Decreased coordination;Decreased mobility;Decreased knowledge of use of DME;Decreased safety awareness;Cardiopulmonary status limiting activity;Obesity       PT Treatment Interventions DME instruction;Gait training;Functional mobility training;Therapeutic activities;Therapeutic exercise;Balance training;Neuromuscular re-education;Patient/family education    PT Goals (Current goals can be found in the Care Plan section)  Acute Rehab PT Goals Patient Stated Goal: to walk to BR PT Goal Formulation: With patient/family Time For Goal Achievement: 12/23/17 Potential to Achieve Goals: Good    Frequency Min 2X/week   Barriers to discharge Decreased caregiver support home alone with RW and family not there     Co-evaluation               AM-PAC PT "6 Clicks" Daily Activity  Outcome Measure Difficulty turning over in bed (including adjusting bedclothes, sheets and blankets)?: Unable Difficulty moving from lying on back to sitting on the side of the bed? : Unable Difficulty sitting down on and standing up from a chair with arms (e.g., wheelchair, bedside commode, etc,.)?: Unable Help needed moving to and from a bed to chair (including a wheelchair)?: A Little Help needed walking in hospital room?: A Little Help needed climbing 3-5 steps with a railing? : Total 6 Click Score: 10    End of Session Equipment Utilized During Treatment: Oxygen;Gait belt Activity Tolerance: Patient limited by fatigue Patient left: in bed;with call bell/phone within reach;with bed alarm set;with family/visitor  present Nurse Communication: Mobility status PT Visit Diagnosis: Unsteadiness on feet (R26.81);Muscle weakness (generalized) (M62.81);Adult, failure to thrive (R62.7)    Time: [id number] PT Time Calculation (min) (ACUTE ONLY): 28 min   Charges:   PT Evaluation $PT Eval Moderate Complexity: 1 Mod PT Treatments $Gait Training: 8-22 mins   PT G Codes:   PT G-Codes **NOT FOR INPATIENT CLASS** Functional Assessment Tool Used: AM-PAC 6 Clicks Basic Mobility    Ivar DrapeRuth E Braydee Shimkus 12/09/2017, 3:32 PM   Samul Dadauth Exzavier Ruderman, PT MS Acute Rehab Dept. Number: [facility] [phone] and [facility] [phone]

## 2017-12-09 NOTE — Clinical Social Work Note (Signed)
CSW received consult for interest in ALF. CSW will assess when able.  Argentina PonderKaren Martha Erricka Falkner, MSW, Theresia MajorsLCSWA [phone]

## 2017-12-09 NOTE — Progress Notes (Signed)
Patient ID: Karen [name], female   DOB: [birth year], 77 y.o.   MRN: [id number]  ACP note  Patient, son, daughter-in-law and granddaughter at the bedside.  Diagnosis end-stage COPD with limited exercise capacity, COPD exacerbation.  I explained that since the patient is on chronic oxygen and chronic steroids that she is end-stage at this point.  Since she has limited exercise capacity she is going to be very limited on how much she can improve.  Family concerned about her living alone.  They are interested in assisted living.  I will get the social worker involved.  Patient already a DNR.  Time spent on ACP discussion 17 minutes  Dr. Alford Highlandichard Katalin Colledge

## 2017-12-09 NOTE — Plan of Care (Signed)
Pt is A&Ox4. VSS. 2L O2  continued. OOB with assist. PT worked with pt this shift.  Family at bedside earlier in shift.  No complaints thus far. Will continue to monitor and report to oncoming RN .  Progressing Education: Knowledge of General Education information will improve 12/09/2017 1518 - Progressing by Jodie EchevariaWhite, Elva Breaker L, RN Health Behavior/Discharge Planning: Ability to manage health-related needs will improve 12/09/2017 1518 - Progressing by Jodie EchevariaWhite, Keniyah Gelinas L, RN Clinical Measurements: Ability to maintain clinical measurements within normal limits will improve 12/09/2017 1518 - Progressing by Jodie EchevariaWhite, Zenovia Justman L, RN Will remain free from infection 12/09/2017 1518 - Progressing by Jodie EchevariaWhite, Korinna Tat L, RN Diagnostic test results will improve 12/09/2017 1518 - Progressing by Jodie EchevariaWhite, Lavere Stork L, RN Respiratory complications will improve 12/09/2017 1518 - Progressing by Jodie EchevariaWhite, Aseret Hoffman L, RN Cardiovascular complication will be avoided 12/09/2017 1518 - Progressing by Jodie EchevariaWhite, Brylen Wagar L, RN Activity: Risk for activity intolerance will decrease 12/09/2017 1518 - Progressing by Jodie EchevariaWhite, Winner Valeriano L, RN Nutrition: Adequate nutrition will be maintained 12/09/2017 1518 - Progressing by Jodie EchevariaWhite, Anelis Hrivnak L, RN Coping: Level of anxiety will decrease 12/09/2017 1518 - Progressing by Jodie EchevariaWhite, Arnaldo Heffron L, RN Elimination: Will not experience complications related to bowel motility 12/09/2017 1518 - Progressing by Jodie EchevariaWhite, Marianita Botkin L, RN Will not experience complications related to urinary retention 12/09/2017 1518 - Progressing by Jodie EchevariaWhite, Kalyiah Saintil L, RN Pain Managment: General experience of comfort will improve 12/09/2017 1518 - Progressing by Jodie EchevariaWhite, Arayna Illescas L, RN Safety: Ability to remain free from injury will improve 12/09/2017 1518 - Progressing by Jodie EchevariaWhite, Mahrosh Donnell L, RN Skin Integrity: Risk for impaired skin integrity will decrease 12/09/2017 1518 - Progressing by Jodie EchevariaWhite, Sylena Lotter L, RN Education: Knowledge of disease or condition will improve 12/09/2017 1518 - Progressing by  Jodie EchevariaWhite, Sanaz Scarlett L, RN Knowledge of the prescribed therapeutic regimen will improve 12/09/2017 1518 - Progressing by Jodie EchevariaWhite, Beverly Suriano L, RN Activity: Ability to tolerate increased activity will improve 12/09/2017 1518 - Progressing by Jodie EchevariaWhite, Jaquille Kau L, RN Will verbalize the importance of balancing activity with adequate rest periods 12/09/2017 1518 - Progressing by Jodie EchevariaWhite, Elan Brainerd L, RN Respiratory: Ability to maintain a clear airway will improve 12/09/2017 1518 - Progressing by Jodie EchevariaWhite, [place] Warriner L, RN Levels of oxygenation will improve 12/09/2017 1518 - Progressing by Jodie EchevariaWhite, Skanda Worlds L, RN Ability to maintain adequate ventilation will improve 12/09/2017 1518 - Progressing by Jodie EchevariaWhite, Nakeesha Bowler L, RN

## 2017-12-09 NOTE — Plan of Care (Signed)
Patient is SOB with minimal exertion. Cluster care to reserve energy

## 2017-12-09 NOTE — Progress Notes (Signed)
Patient ID: Karen [name], female   DOB: 1941/01/09, 77 y.o.   MRN: [id number]  [facility] PROGRESS NOTE  Karen [name] HQI:[id number] DOB: 1941/01/09 DOA: 12/07/2017 PCP: Ellyn HackShah, Syed Asad A, MD  HPI/Subjective: Patient feeling very weak.  Very limited with activity.  Very short of breath.  Family concerned about living alone.  Objective: Vitals:   12/09/17 0737 12/09/17 1025  BP:  131/67  Pulse:  (!) 103  Resp:  (!) 24  Temp:    SpO2: 97% 100%    Filed Weights   12/07/17 1900  Weight: 81.2 kg (179 lb 1.6 oz)    ROS: Review of Systems  Constitutional: Negative for chills and fever.  Eyes: Negative for blurred vision.  Respiratory: Positive for cough, shortness of breath and wheezing.   Cardiovascular: Negative for chest pain.  Gastrointestinal: Negative for abdominal pain, constipation, diarrhea, nausea and vomiting.  Genitourinary: Negative for dysuria.  Musculoskeletal: Negative for joint pain.  Neurological: Negative for dizziness and headaches.   Exam: Physical Exam  Constitutional: She is oriented to person, place, and time.  HENT:  Nose: No mucosal edema.  Mouth/Throat: No oropharyngeal exudate or posterior oropharyngeal edema.  Eyes: Conjunctivae, EOM and lids are normal. Pupils are equal, round, and reactive to light.  Neck: No JVD present. Carotid bruit is not present. No edema present. No thyroid mass and no thyromegaly present.  Cardiovascular: S1 normal and S2 normal. Exam reveals no gallop.  No murmur heard. Pulses:      Dorsalis pedis pulses are 2+ on the right side, and 2+ on the left side.  Respiratory: No respiratory distress. She has decreased breath sounds in the right middle field, the right lower field, the left middle field and the left lower field. She has no wheezes. She has no rhonchi. She has no rales.  GI: Soft. Bowel sounds are normal. There is no tenderness.  Musculoskeletal:       Right ankle: She exhibits swelling.       Left ankle: She  exhibits swelling.  Lymphadenopathy:    She has no cervical adenopathy.  Neurological: She is alert and oriented to person, place, and time. No cranial nerve deficit.  Skin: Skin is warm. No rash noted. Nails show no clubbing.  Psychiatric: She has a normal mood and affect.      Data Reviewed: Basic Metabolic Panel: Recent Labs  Lab 12/07/17 1509  NA 139  K 4.1  CL 99*  CO2 31  GLUCOSE 230*  BUN 28*  CREATININE 1.21*  CALCIUM 8.5*   CBC: Recent Labs  Lab 12/07/17 1509  WBC 8.7  HGB 11.4*  HCT 35.5  MCV 88.6  PLT 191    CBG: Recent Labs  Lab 12/08/17 1138 12/08/17 1652 12/08/17 2122 12/09/17 0745 12/09/17 1144  GLUCAP 309* 246* 180* 270* 250*    Scheduled Meds: . amLODipine  5 mg Oral Daily  . budesonide (PULMICORT) nebulizer solution  0.5 mg Nebulization BID  . clopidogrel  75 mg Oral Daily  . collagenase   Topical Daily  . digoxin  0.125 mg Oral QODAY  . enoxaparin (LOVENOX) injection  40 mg Subcutaneous Q24H  . ferrous sulfate  325 mg Oral QODAY  . furosemide  40 mg Oral BID  . insulin aspart  0-20 Units Subcutaneous TID WC  . insulin aspart  0-5 Units Subcutaneous QHS  . insulin aspart protamine- aspart  27 Units Subcutaneous Q supper  . insulin aspart protamine- aspart  50 Units  Subcutaneous Q breakfast  . ipratropium-albuterol  3 mL Nebulization Q4H  . levothyroxine  150 mcg Oral QAC breakfast  . losartan  100 mg Oral Daily  . mouth rinse  15 mL Mouth Rinse BID  . methylPREDNISolone (SOLU-MEDROL) injection  40 mg Intravenous Q6H  . pantoprazole  40 mg Oral QAC breakfast  . pravastatin  40 mg Oral Daily  . raloxifene  60 mg Oral Daily  . sodium chloride flush  3 mL Intravenous Q12H  . [START ON 12/11/2017] Vitamin D (Ergocalciferol)  50,000 Units Oral Q7 days  . vitamin E  400 Units Oral Daily   Continuous Infusions:  Assessment/Plan:  1. COPD exacerbation.  Decrease dose of Solu-Medrol down to 40 mg IV daily.  Continue budesonide and  DuoNeb nebulizer solution. 2. Chronic respiratory failure back down to 2 L nasal cannula. 3. Weakness.  Physical therapy recommended home with home health but family concerned about living alone.  They are interested in assisted living. 4. Essential hypertension continue usual medications 5. Hypothyroidism unspecified on levothyroxine 6. Hyperlipidemia unspecified on pravastatin 7. GERD on Protonix 8. Type 2 diabetes mellitus on 70/30 insulin.  Code Status:     Code Status Orders  (From admission, onward)        Start     Ordered   12/07/17 1500  Do not attempt resuscitation (DNR)  Continuous    Question Answer Comment  In the event of cardiac or respiratory ARREST Do not call a "code blue"   In the event of cardiac or respiratory ARREST Do not perform Intubation, CPR, defibrillation or ACLS   In the event of cardiac or respiratory ARREST Use medication by any route, position, wound care, and other measures to relive pain and suffering. May use oxygen, suction and manual treatment of airway obstruction as needed for comfort.      12/07/17 1459    Code Status History    Date Active Date Inactive Code Status Order ID Comments User Context   12/07/2017 14:46 12/07/2017 14:59 Full Code [id number]  Houston Siren, MD Inpatient   03/28/2017 15:09 03/30/2017 21:26 DNR [id number]  Adrian Saran, MD Inpatient    Advance Directive Documentation     Most Recent Value  Type of Advance Directive  Healthcare Power of Attorney, Living will  Pre-existing out of facility DNR order (yellow form or pink MOST form)  No data  "MOST" Form in Place?  No data     Family Communication: Family at bedside Disposition Plan: To be determined  Time spent: 35 minutes including ACP time  [employer]

## 2017-12-09 NOTE — Clinical Social Work Note (Signed)
CSW has spoken with family and will enter assessment when able. Family is agreeable to STR. CSW is following.  Argentina PonderKaren Martha Hlee Fringer, MSW, Theresia MajorsLCSWA [phone]

## 2017-12-10 DIAGNOSIS — R0602 Shortness of breath: Secondary | ICD-10-CM | POA: Diagnosis not present

## 2017-12-10 DIAGNOSIS — J441 Chronic obstructive pulmonary disease with (acute) exacerbation: Secondary | ICD-10-CM | POA: Diagnosis not present

## 2017-12-10 LAB — GLUCOSE, CAPILLARY
Glucose-Capillary: 191 mg/dL — ABNORMAL HIGH (ref 65–99)
Glucose-Capillary: 137 mg/dL — ABNORMAL HIGH (ref 65–99)
Glucose-Capillary: 233 mg/dL — ABNORMAL HIGH (ref 65–99)
Glucose-Capillary: 170 mg/dL — ABNORMAL HIGH (ref 65–99)

## 2017-12-10 MED ORDER — FUROSEMIDE 40 MG PO TABS
40.0000 mg | ORAL_TABLET | Freq: Every day | ORAL | Status: DC
  Administered 2017-12-11 – 2017-12-13 (×3): 40 mg via ORAL
  Filled 2017-12-10 (×3): qty 1

## 2017-12-10 MED ORDER — VITAMIN E 180 MG (400 UNIT) PO CAPS
400.0000 [IU] | ORAL_CAPSULE | Freq: Every day | ORAL | Status: DC
  Administered 2017-12-11 – 2017-12-13 (×3): 400 [IU] via ORAL
  Filled 2017-12-10 (×3): qty 1

## 2017-12-10 MED ORDER — FERROUS SULFATE 325 (65 FE) MG PO TABS
325.0000 mg | ORAL_TABLET | ORAL | Status: DC
  Administered 2017-12-11 – 2017-12-13 (×2): 325 mg via ORAL
  Filled 2017-12-10 (×2): qty 1

## 2017-12-10 MED ORDER — VITAMIN D (ERGOCALCIFEROL) 1.25 MG (50000 UNIT) PO CAPS
50000.0000 [IU] | ORAL_CAPSULE | ORAL | Status: DC
  Administered 2017-12-11: 50000 [IU] via ORAL
  Filled 2017-12-10: qty 1

## 2017-12-10 NOTE — Progress Notes (Signed)
Patient ID: Karen [name], female   DOB: 1941-08-11, 77 y.o.   MRN: [id number]  [facility] PROGRESS NOTE  ALAN RILES WUJ:[id number] DOB: December 20, 1940 DOA: 12/07/2017 PCP: Ellyn Hack, MD  HPI/Subjective: Patient with more wheeze today.  Family states that she takes her medications usually spread out during the day rather than all at once.  Nursing staff to speak with pharmacy about spitting out some of the medications a little bit.  Family also interested in getting rid of medications that we can get rid of.  Patient still with cough and shortness of breath.  Objective: Vitals:   12/10/17 0417 12/10/17 0803  BP: 138/72 (!) 102/57  Pulse: 95 92  Resp: 18 20  Temp: 98 F (36.7 C) 98.2 F (36.8 C)  SpO2: 94% 92%    Filed Weights   12/07/17 1900  Weight: 81.2 kg (179 lb 1.6 oz)    ROS: Review of Systems  Constitutional: Negative for chills and fever.  Eyes: Negative for blurred vision.  Respiratory: Positive for cough, shortness of breath and wheezing.   Cardiovascular: Negative for chest pain.  Gastrointestinal: Negative for abdominal pain, constipation, diarrhea, nausea and vomiting.  Genitourinary: Negative for dysuria.  Musculoskeletal: Negative for joint pain.  Neurological: Negative for dizziness and headaches.   Exam: Physical Exam  Constitutional: She is oriented to person, place, and time.  HENT:  Nose: No mucosal edema.  Mouth/Throat: No oropharyngeal exudate or posterior oropharyngeal edema.  Eyes: Conjunctivae, EOM and lids are normal. Pupils are equal, round, and reactive to light.  Neck: No JVD present. Carotid bruit is not present. No edema present. No thyroid mass and no thyromegaly present.  Cardiovascular: S1 normal and S2 normal. Exam reveals no gallop.  No murmur heard. Pulses:      Dorsalis pedis pulses are 2+ on the right side, and 2+ on the left side.  Respiratory: No respiratory distress. She has decreased breath sounds in the right middle  field, the right lower field, the left middle field and the left lower field. She has wheezes in the right middle field, the right lower field, the left middle field and the left lower field. She has no rhonchi. She has no rales.  GI: Soft. Bowel sounds are normal. There is no tenderness.  Musculoskeletal:       Right ankle: She exhibits swelling.       Left ankle: She exhibits swelling.  Lymphadenopathy:    She has no cervical adenopathy.  Neurological: She is alert and oriented to person, place, and time. No cranial nerve deficit.  Skin: Skin is warm. No rash noted. Nails show no clubbing.  Psychiatric: She has a normal mood and affect.      Data Reviewed: Basic Metabolic Panel: Recent Labs  Lab 12/07/17 1509  NA 139  K 4.1  CL 99*  CO2 31  GLUCOSE 230*  BUN 28*  CREATININE 1.21*  CALCIUM 8.5*   CBC: Recent Labs  Lab 12/07/17 1509  WBC 8.7  HGB 11.4*  HCT 35.5  MCV 88.6  PLT 191    CBG: Recent Labs  Lab 12/09/17 1144 12/09/17 1638 12/09/17 2024 12/10/17 0801 12/10/17 1141  GLUCAP 250* 185* 288* 191* 137*    Scheduled Meds: . amLODipine  5 mg Oral Daily  . budesonide (PULMICORT) nebulizer solution  0.5 mg Nebulization BID  . clopidogrel  75 mg Oral Daily  . collagenase   Topical Daily  . digoxin  0.125 mg Oral QODAY  .  enoxaparin (LOVENOX) injection  40 mg Subcutaneous Q24H  . [START ON 12/11/2017] ferrous sulfate  325 mg Oral QODAY  . furosemide  40 mg Oral BID  . insulin aspart  0-20 Units Subcutaneous TID WC  . insulin aspart  0-5 Units Subcutaneous QHS  . insulin aspart protamine- aspart  27 Units Subcutaneous Q supper  . insulin aspart protamine- aspart  50 Units Subcutaneous Q breakfast  . ipratropium-albuterol  3 mL Nebulization Q4H  . levothyroxine  150 mcg Oral QAC breakfast  . losartan  100 mg Oral Daily  . mouth rinse  15 mL Mouth Rinse BID  . methylPREDNISolone (SOLU-MEDROL) injection  40 mg Intravenous Daily  . pantoprazole  40 mg Oral  QAC breakfast  . pravastatin  40 mg Oral Daily  . raloxifene  60 mg Oral Daily  . sodium chloride flush  3 mL Intravenous Q12H  . [START ON 12/11/2017] Vitamin D (Ergocalciferol)  50,000 Units Oral Q7 days  . [START ON 12/11/2017] vitamin E  400 Units Oral Daily    Assessment/Plan:  1. COPD exacerbation.  Patient with more wheezing today.  Continue dose of Solu-Medrol down to 40 mg IV daily.  Continue budesonide and DuoNeb nebulizer solution.  Patient did not want to use Mucomyst at this time. 2. Chronic respiratory failure back down to 2 L nasal cannula. 3. Weakness.  Physical therapy recommended rehab.  Family also interested in assisted living. 4. Essential hypertension.  with blood pressure on the lower side hold Norvasc at this point 5. Hypothyroidism unspecified on levothyroxine 6. Hyperlipidemia unspecified.  With weakness hold pravastatin 7. GERD on Protonix 8. Type 2 diabetes mellitus on 70/30 insulin.  Code Status:     Code Status Orders  (From admission, onward)        Start     Ordered   12/07/17 1500  Do not attempt resuscitation (DNR)  Continuous    Question Answer Comment  In the event of cardiac or respiratory ARREST Do not call a "code blue"   In the event of cardiac or respiratory ARREST Do not perform Intubation, CPR, defibrillation or ACLS   In the event of cardiac or respiratory ARREST Use medication by any route, position, wound care, and other measures to relive pain and suffering. May use oxygen, suction and manual treatment of airway obstruction as needed for comfort.      12/07/17 1459    Code Status History    Date Active Date Inactive Code Status Order ID Comments User Context   12/07/2017 14:46 12/07/2017 14:59 Full Code [id number]  Houston SirenSainani, Vivek J, MD Inpatient   03/28/2017 15:09 03/30/2017 21:26 DNR [id number]  Adrian SaranMody, Sital, MD Inpatient    Advance Directive Documentation     Most Recent Value  Type of Advance Directive  Healthcare Power of Attorney,  Living will  Pre-existing out of facility DNR order (yellow form or pink MOST form)  No data  "MOST" Form in Place?  No data     Family Communication: Family at bedside Disposition Plan: Rehab once breathing better  Time spent: 28 minutes   Rohith Fauth [employer]  [facility]

## 2017-12-10 NOTE — Plan of Care (Signed)
Pt is A&Ox4. VSS . 2L O2 [place] continued. Family at bedside earlier in shift. No complaints thus far. Waiting on SNF placement. Will continue to monitor and report to oncoming RN .  Progressing Education: Knowledge of General Education information will improve 12/10/2017 1522 - Progressing by Jodie EchevariaWhite, Destiny Hagin L, RN Health Behavior/Discharge Planning: Ability to manage health-related needs will improve 12/10/2017 1522 - Progressing by Jodie EchevariaWhite, Cashius Grandstaff L, RN Clinical Measurements: Ability to maintain clinical measurements within normal limits will improve 12/10/2017 1522 - Progressing by Jodie EchevariaWhite, Jama Mcmiller L, RN Will remain free from infection 12/10/2017 1522 - Progressing by Jodie EchevariaWhite, Jyssica Rief L, RN Diagnostic test results will improve 12/10/2017 1522 - Progressing by Jodie EchevariaWhite, Lalo Tromp L, RN Respiratory complications will improve 12/10/2017 1522 - Progressing by Jodie EchevariaWhite, Shaquil Aldana L, RN Cardiovascular complication will be avoided 12/10/2017 1522 - Progressing by Jodie EchevariaWhite, Aidon Klemens L, RN Activity: Risk for activity intolerance will decrease 12/10/2017 1522 - Progressing by Jodie EchevariaWhite, Lashundra Shiveley L, RN Nutrition: Adequate nutrition will be maintained 12/10/2017 1522 - Progressing by Jodie EchevariaWhite, Aaleigha Bozza L, RN Coping: Level of anxiety will decrease 12/10/2017 1522 - Progressing by Jodie EchevariaWhite, Batul Diego L, RN Elimination: Will not experience complications related to bowel motility 12/10/2017 1522 - Progressing by Jodie EchevariaWhite, Sergi Gellner L, RN Will not experience complications related to urinary retention 12/10/2017 1522 - Progressing by Jodie EchevariaWhite, Makara Lanzo L, RN Pain Managment: General experience of comfort will improve 12/10/2017 1522 - Progressing by Jodie EchevariaWhite, Jessiah Steinhart L, RN Safety: Ability to remain free from injury will improve 12/10/2017 1522 - Progressing by Jodie EchevariaWhite, Michio Thier L, RN Skin Integrity: Risk for impaired skin integrity will decrease 12/10/2017 1522 - Progressing by Jodie EchevariaWhite, Mickenzie Stolar L, RN Education: Knowledge of disease or condition will improve 12/10/2017 1522 - Progressing by Jodie EchevariaWhite, Katty Fretwell L,  RN Knowledge of the prescribed therapeutic regimen will improve 12/10/2017 1522 - Progressing by Jodie EchevariaWhite, Oberia Beaudoin L, RN Activity: Ability to tolerate increased activity will improve 12/10/2017 1522 - Progressing by Jodie EchevariaWhite, Teri Legacy L, RN Will verbalize the importance of balancing activity with adequate rest periods 12/10/2017 1522 - Progressing by Jodie EchevariaWhite, Prince Olivier L, RN Respiratory: Ability to maintain a clear airway will improve 12/10/2017 1522 - Progressing by Jodie EchevariaWhite, Ricka Westra L, RN Levels of oxygenation will improve 12/10/2017 1522 - Progressing by Jodie EchevariaWhite, Debroah Shuttleworth L, RN Ability to maintain adequate ventilation will improve 12/10/2017 1522 - Progressing by Jodie EchevariaWhite, Leticia Coletta L, RN

## 2017-12-10 NOTE — Discharge Planning (Signed)
This RN talked with pharmacist about possible retiming of pt's medications per pt/family prefence. Per pharmacist, only vitamins can be retimed. Pt made aware.

## 2017-12-10 NOTE — NC FL2 (Signed)
[place] MEDICAID FL2 LEVEL OF CARE SCREENING TOOL     IDENTIFICATION  Patient Name: Karen [name] Birthdate: 11-11-1941 Sex: female Admission Date (Current Location): 12/07/2017  [place] and [place] Number:  [address] [id number] Q Facility and Address:  [facility], [address], [place], [place] [address]      Provider Number: [id number]  Attending Physician Name and Address:  Alford Highland, MD  Relative Name and Phone Number:  Samary Shatz (son) [phone]    Current Level of Care: Hospital Recommended Level of Care: Skilled Nursing Facility Prior Approval Number:    Date Approved/Denied: 12/10/17 PASRR Number: [id number]  Discharge Plan: SNF    Current Diagnoses: Patient Active Problem List   Diagnosis Date Noted  . History of PSVT (paroxysmal supraventricular tachycardia) 10/20/2017  . Sprain of ankle 10/20/2017  . Osteoporosis 08/22/2017  . COPD (chronic obstructive pulmonary disease) (HCC) 03/28/2017  . Swelling of limb 09/27/2016  . Lower limb ulcer, calf, left, limited to breakdown of skin (HCC) 09/27/2016  . Encounter related to need for assistance with personal care 04/13/2016  . Personal history of other diseases of the circulatory system 11/09/2015  . Easy bruising 10/20/2015  . COPD exacerbation (HCC) 10/19/2015  . Atrial fibrillation (HCC) 10/19/2015  . Diabetes mellitus (HCC) 10/19/2015  . 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 Proteinuria due to type 2 diabetes mellitus (HCC) 06/26/2015  . Morbid obesity due to excess calories (HCC) 06/26/2015  . Obesity, Class II, BMI 35-39.9 06/26/2015  . On continuous oral anticoagulation 06/26/2015  . Asthma with COPD (HCC) 06/26/2015  . Hyperlipidemia 06/26/2015  . Obstructive sleep apnea 06/26/2015  . CAFL (chronic airflow limitation) (HCC) 06/01/2015  . A-fib (HCC) 06/01/2015  . Diabetes (HCC) 06/01/2015  . History of prolonged Q-T  interval on ECG 06/01/2015  . HLD (hyperlipidemia) 06/01/2015  . BP (high blood pressure) 06/01/2015  . Obstructive apnea 06/01/2015    Orientation RESPIRATION BLADDER Height & Weight     Self, Time, Situation, Place  O2(2L o2) Continent Weight: 179 lb 1.6 oz (81.2 kg) Height:  4\' 11"  (149.9 cm)  BEHAVIORAL SYMPTOMS/MOOD NEUROLOGICAL BOWEL NUTRITION STATUS      Continent Diet(Heart Healthy/Carb modified)  AMBULATORY STATUS COMMUNICATION OF NEEDS Skin   Extensive Assist Verbally Normal                       Personal Care Assistance Level of Assistance  Bathing, Feeding, Dressing Bathing Assistance: Limited assistance Feeding assistance: Independent Dressing Assistance: Limited assistance     Functional Limitations Info             SPECIAL CARE FACTORS FREQUENCY  PT (By licensed PT)     PT Frequency: 5/week              Contractures Contractures Info: Not present    Additional Factors Info  Code Status, Allergies, Insulin Sliding Scale Code Status Info: DNR Allergies Info: Ace Inhibitors, Singulair Montelukast Sodium   Insulin Sliding Scale Info: Novolog: 50 units qbreakfast, 0-20 units qlunch, 27 units qsupper, 0-5 units QHS       Current Medications (12/10/2017):  This is the current hospital active medication list Current Facility-Administered Medications  Medication Dose Route Frequency Provider Last Rate Last Dose  . acetaminophen (TYLENOL) tablet 650 mg  650 mg Oral Q6H PRN Houston Siren, MD       Or  . acetaminophen (TYLENOL) suppository 650 mg  650 mg Rectal Q6H  PRN Houston SirenSainani, Vivek J, MD      . ALPRAZolam Prudy Feeler(XANAX) tablet 0.25 mg  0.25 mg Oral TID PRN Houston SirenSainani, Vivek J, MD      . budesonide (PULMICORT) nebulizer solution 0.5 mg  0.5 mg Nebulization BID Houston SirenSainani, Vivek J, MD   0.5 mg at 12/10/17 0718  . clopidogrel (PLAVIX) tablet 75 mg  75 mg Oral Daily Houston SirenSainani, Vivek J, MD   75 mg at 12/10/17 [id number]  . collagenase (SANTYL) ointment   Topical Daily  Enedina FinnerPatel, Sona, MD      . digoxin (LANOXIN) tablet 0.125 mg  0.125 mg Oral QODAY Houston SirenSainani, Vivek J, MD   0.125 mg at 12/10/17 0831  . enoxaparin (LOVENOX) injection 40 mg  40 mg Subcutaneous Q24H Houston SirenSainani, Vivek J, MD   40 mg at 12/10/17 1416  . [START ON 12/11/2017] ferrous sulfate tablet 325 mg  325 mg Oral QODAY Wieting, Richard, MD      . Melene Muller[START ON 12/11/2017] furosemide (LASIX) tablet 40 mg  40 mg Oral Daily Wieting, Richard, MD      . insulin aspart (novoLOG) injection 0-20 Units  0-20 Units Subcutaneous TID WC Houston SirenSainani, Vivek J, MD   7 Units at 12/10/17 1646  . insulin aspart (novoLOG) injection 0-5 Units  0-5 Units Subcutaneous QHS Houston SirenSainani, Vivek J, MD   3 Units at 12/09/17 2115  . insulin aspart protamine- aspart (NOVOLOG MIX 70/30) injection 27 Units  27 Units Subcutaneous Q supper Houston SirenSainani, Vivek J, MD   27 Units at 12/10/17 1646  . insulin aspart protamine- aspart (NOVOLOG MIX 70/30) injection 50 Units  50 Units Subcutaneous Q breakfast Houston SirenSainani, Vivek J, MD   50 Units at 12/10/17 0830  . ipratropium-albuterol (DUONEB) 0.5-2.5 (3) MG/3ML nebulizer solution 3 mL  3 mL Nebulization Q4H Enid BaasKalisetti, Radhika, MD   3 mL at 12/10/17 1602  . levothyroxine (SYNTHROID, LEVOTHROID) tablet 150 mcg  150 mcg Oral QAC breakfast Houston SirenSainani, Vivek J, MD   150 mcg at 12/10/17 0831  . losartan (COZAAR) tablet 100 mg  100 mg Oral Daily Houston SirenSainani, Vivek J, MD   100 mg at 12/10/17 0831  . MEDLINE mouth rinse  15 mL Mouth Rinse BID Houston SirenSainani, Vivek J, MD   15 mL at 12/10/17 0833  . methylPREDNISolone sodium succinate (SOLU-MEDROL) 40 mg/mL injection 40 mg  40 mg Intravenous Daily Alford HighlandWieting, Richard, MD   40 mg at 12/10/17 0830  . pantoprazole (PROTONIX) EC tablet 40 mg  40 mg Oral QAC breakfast Houston SirenSainani, Vivek J, MD   40 mg at 12/10/17 0831  . raloxifene (EVISTA) tablet 60 mg  60 mg Oral Daily Houston SirenSainani, Vivek J, MD   60 mg at 12/10/17 0831  . sodium chloride flush (NS) 0.9 % injection 3 mL  3 mL Intravenous Q12H Houston SirenSainani, Vivek J, MD    3 mL at 12/10/17 0830  . [START ON 12/11/2017] Vitamin D (Ergocalciferol) (DRISDOL) capsule 50,000 Units  50,000 Units Oral Q7 days Alford HighlandWieting, Richard, MD      . Melene Muller[START ON 12/11/2017] vitamin E capsule 400 Units  400 Units Oral Daily Alford HighlandWieting, Richard, MD         Discharge Medications: Please see discharge summary for a list of discharge medications.  Relevant Imaging Results:  Relevant Lab Results:   Additional Information SS# [id number]  Judi CongKaren M Zamani Crocker, LCSW

## 2017-12-10 NOTE — Clinical Social Work Note (Signed)
Clinical Social Work Assessment  Patient Details  Name: Karen [name] MRN: [id number] Date of Birth: [birth year]  Date of referral:  12/10/17               Reason for consult:  Facility Placement                Permission sought to share information with:  Facility Industrial/product designerContact Representative Permission granted to share information::  Yes, Verbal Permission Granted  Name::        Agency::     Relationship::     Contact Information:     Housing/Transportation Living arrangements for the past 2 months:  Single Family Home Source of Information:  Medical Team, Adult Children Patient Interpreter Needed:  None Criminal Activity/Legal Involvement Pertinent to Current Situation/Hospitalization:  No - Comment as needed Significant Relationships:  Adult Children, Church, [employer] Support Lives with:  Self Do you feel safe going back to the place where you live?  Yes Need for family participation in patient care:  No (Coment)  Care giving concerns:  PT recommendation for STR   Social Worker assessment / plan:  CSW spoke with the patient's son and daughter-in-law via telephone to discuss discharge planning. The family and patient are agreeable to STR with possible discharge from STR to ALF. The patient has Medicaid and is agreeable to ALF placement due to declining safety at home.  The CSW explained the referral process and provided information about next steps for long term care needs. The CSW will follow up with bed offers when available and follow to facilitate the discharge.   Employment status:  Retired Database administratornsurance information:  Managed Medicare PT Recommendations:  Skilled Nursing Facility Information / Referral to community resources:  Skilled Nursing Facility  Patient/Family's Response to care:  The patient's family thanked the CSW for assistance.  Patient/Family's Understanding of and Emotional Response to Diagnosis, Current Treatment, and Prognosis:  The family understands the changing  needs for the patient and are in agreement with the discharge plan.  Emotional Assessment Appearance:  Appears stated age Attitude/Demeanor/Rapport:  Engaged Affect (typically observed):  Accepting, Pleasant Orientation:  Oriented to Self, Oriented to Place, Oriented to  Time, Oriented to Situation Alcohol / Substance use:  Never Used Psych involvement (Current and /or in the community):  No (Comment)  Discharge Needs  Concerns to be addressed:  Care Coordination, Discharge Planning Concerns Readmission within the last 30 days:  No Current discharge risk:  Lives alone Barriers to Discharge:  Continued Medical Work up   [employer] CorporationKaren M Vanesa Renier, LCSW 12/10/2017, 5:25 PM

## 2017-12-11 DIAGNOSIS — R0602 Shortness of breath: Secondary | ICD-10-CM | POA: Diagnosis not present

## 2017-12-11 DIAGNOSIS — J441 Chronic obstructive pulmonary disease with (acute) exacerbation: Secondary | ICD-10-CM | POA: Diagnosis not present

## 2017-12-11 LAB — BASIC METABOLIC PANEL
Anion gap: 7 (ref 5–15)
BUN: 30 mg/dL — AB (ref 6–20)
CHLORIDE: 106 mmol/L (ref 101–111)
CO2: 34 mmol/L — AB (ref 22–32)
Calcium: 8.1 mg/dL — ABNORMAL LOW (ref 8.9–10.3)
Creatinine, Ser: 0.86 mg/dL (ref 0.44–1.00)
GFR calc non Af Amer: >60 mL/min (ref >60)
GLUCOSE: 62 mg/dL — AB (ref 65–99)
Potassium: 4 mmol/L (ref 3.5–5.1)
Sodium: 147 mmol/L — ABNORMAL HIGH (ref 135–145)

## 2017-12-11 LAB — GLUCOSE, CAPILLARY
GLUCOSE-CAPILLARY: 53 mg/dL — AB (ref 65–99)
GLUCOSE-CAPILLARY: 131 mg/dL — AB (ref 65–99)
Glucose-Capillary: 77 mg/dL (ref 65–99)
Glucose-Capillary: 90 mg/dL (ref 65–99)
Glucose-Capillary: 238 mg/dL — ABNORMAL HIGH (ref 65–99)

## 2017-12-11 LAB — CK: Total CK: 148 U/L (ref 38–234)

## 2017-12-11 LAB — DIGOXIN LEVEL: Digoxin Level: 0.2 ng/mL — ABNORMAL LOW (ref 0.8–2.0)

## 2017-12-11 MED ORDER — METHYLPREDNISOLONE SODIUM SUCC 40 MG IJ SOLR
40.0000 mg | Freq: Three times a day (TID) | INTRAMUSCULAR | Status: DC
  Administered 2017-12-11 – 2017-12-12 (×3): 40 mg via INTRAVENOUS
  Filled 2017-12-11 (×2): qty 1

## 2017-12-11 MED ORDER — INSULIN ASPART PROT & ASPART (70-30 MIX) 100 UNIT/ML ~~LOC~~ SUSP
25.0000 [IU] | Freq: Every day | SUBCUTANEOUS | Status: DC
  Administered 2017-12-11: 25 [IU] via SUBCUTANEOUS
  Filled 2017-12-11: qty 1

## 2017-12-11 MED ORDER — INSULIN ASPART PROT & ASPART (70-30 MIX) 100 UNIT/ML ~~LOC~~ SUSP: 25.0000 [IU] | Freq: Every day | SUBCUTANEOUS | Status: DC

## 2017-12-11 MED ORDER — POLYETHYLENE GLYCOL 3350 17 G PO PACK
17.0000 g | PACK | Freq: Every day | ORAL | Status: DC
  Filled 2017-12-11 (×2): qty 1

## 2017-12-11 MED ORDER — DEXTROMETHORPHAN POLISTIREX ER 30 MG/5ML PO SUER
15.0000 mg | Freq: Two times a day (BID) | ORAL | Status: DC
  Administered 2017-12-11 – 2017-12-13 (×2): 15 mg via ORAL
  Filled 2017-12-11 (×5): qty 5

## 2017-12-11 MED ORDER — INSULIN ASPART PROT & ASPART (70-30 MIX) 100 UNIT/ML ~~LOC~~ SUSP
18.0000 [IU] | Freq: Every day | SUBCUTANEOUS | Status: DC
  Administered 2017-12-11 – 2017-12-12 (×2): 18 [IU] via SUBCUTANEOUS
  Filled 2017-12-11 (×2): qty 1

## 2017-12-11 NOTE — Progress Notes (Signed)
MD notified for scheduled 70/30 insulin due. Pts CBG at 0700is 52, protocol followed. MD decreases a.m. And p.m. Dose of 70/30. I will continue to assess.

## 2017-12-11 NOTE — Progress Notes (Signed)
Patient ID: Karen [name], female   DOB: 1941-11-07, 77 y.o.   MRN: [id number]  [facility] PROGRESS NOTE  Karen [name]:[id number] DOB: [birth year] DOA: 12/07/2017 PCP: Ellyn Hack, MD  HPI/Subjective: Patient feels a little bit better than yesterday.  Still cough and wheezing and short of breath.  Patient had a low sugar this morning.  Objective: Vitals:   12/11/17 0740 12/11/17 1127  BP: 113/61   Pulse: 100   Resp: 20   Temp: 97.9 F (36.6 C)   SpO2: 90% 90%    Filed Weights   12/07/17 1900  Weight: 81.2 kg (179 lb 1.6 oz)    ROS: Review of Systems  Constitutional: Negative for chills and fever.  Eyes: Negative for blurred vision.  Respiratory: Positive for cough, shortness of breath and wheezing.   Cardiovascular: Negative for chest pain.  Gastrointestinal: Negative for abdominal pain, constipation, diarrhea, nausea and vomiting.  Genitourinary: Negative for dysuria.  Musculoskeletal: Negative for joint pain.  Neurological: Negative for dizziness and headaches.   Exam: Physical Exam  Constitutional: She is oriented to person, place, and time.  HENT:  Nose: No mucosal edema.  Mouth/Throat: No oropharyngeal exudate or posterior oropharyngeal edema.  Eyes: Conjunctivae, EOM and lids are normal. Pupils are equal, round, and reactive to light.  Neck: No JVD present. Carotid bruit is not present. No edema present. No thyroid mass and no thyromegaly present.  Cardiovascular: S1 normal and S2 normal. Exam reveals no gallop.  No murmur heard. Pulses:      Dorsalis pedis pulses are 2+ on the right side, and 2+ on the left side.  Respiratory: No respiratory distress. She has decreased breath sounds in the right middle field, the right lower field, the left middle field and the left lower field. She has wheezes in the right middle field, the right lower field, the left middle field and the left lower field. She has no rhonchi. She has no rales.  GI: Soft. Bowel sounds  are normal. There is no tenderness.  Musculoskeletal:       Right ankle: She exhibits swelling.       Left ankle: She exhibits swelling.  Lymphadenopathy:    She has no cervical adenopathy.  Neurological: She is alert and oriented to person, place, and time. No cranial nerve deficit.  Skin: Skin is warm. Nails show no clubbing.  Fifth toe right foot there is a scab.  Posterior right calf small little ulceration.  Psychiatric: She has a normal mood and affect.      Data Reviewed: Basic Metabolic Panel: Recent Labs  Lab 12/07/17 1509 12/11/17 0517  NA 139 147*  K 4.1 4.0  CL 99* 106  CO2 31 34*  GLUCOSE 230* 62*  BUN 28* 30*  CREATININE 1.21* 0.86  CALCIUM 8.5* 8.1*   CBC: Recent Labs  Lab 12/07/17 1509  WBC 8.7  HGB 11.4*  HCT 35.5  MCV 88.6  PLT 191    CBG: Recent Labs  Lab 12/10/17 1620 12/10/17 2101 12/11/17 0741 12/11/17 0802 12/11/17 1133  GLUCAP 233* 170* 53* 77 90    Scheduled Meds: . budesonide (PULMICORT) nebulizer solution  0.5 mg Nebulization BID  . clopidogrel  75 mg Oral Daily  . collagenase   Topical Daily  . digoxin  0.125 mg Oral QODAY  . enoxaparin (LOVENOX) injection  40 mg Subcutaneous Q24H  . ferrous sulfate  325 mg Oral QODAY  . furosemide  40 mg Oral Daily  .  insulin aspart  0-20 Units Subcutaneous TID WC  . insulin aspart  0-5 Units Subcutaneous QHS  . insulin aspart protamine- aspart  18 Units Subcutaneous Q supper  . insulin aspart protamine- aspart  25 Units Subcutaneous Q breakfast  . ipratropium-albuterol  3 mL Nebulization Q4H  . levothyroxine  150 mcg Oral QAC breakfast  . losartan  100 mg Oral Daily  . mouth rinse  15 mL Mouth Rinse BID  . methylPREDNISolone (SOLU-MEDROL) injection  40 mg Intravenous Q8H  . pantoprazole  40 mg Oral QAC breakfast  . polyethylene glycol  17 g Oral Daily  . raloxifene  60 mg Oral Daily  . sodium chloride flush  3 mL Intravenous Q12H  . Vitamin D (Ergocalciferol)  50,000 Units Oral Q7  days  . vitamin E  400 Units Oral Daily    Assessment/Plan:  1. COPD exacerbation.  Patient with more wheezing today.  Increase dose of Solu-Medrol down to 40 mg IV every 8.  Continue budesonide and DuoNeb nebulizer solution.  2. Chronic respiratory failure back down to 2 L nasal cannula. 3. Relative hypoglycemia with type 2 diabetes.  Decrease dose of 70/30 insulin in the morning and at night.  Increasing steroid should also raise the sugars. 4. Weakness.  Physical therapy recommended rehab.  Family also interested in assisted living long-term. 5. Essential hypertension.  with blood pressure on the lower side hold Norvasc at this point.  On losartan. 6. Hypothyroidism unspecified on levothyroxine 7. Hyperlipidemia unspecified.  With weakness hold pravastatin 8. GERD on Protonix   Code Status:     Code Status Orders  (From admission, onward)        Start     Ordered   12/07/17 1500  Do not attempt resuscitation (DNR)  Continuous    Question Answer Comment  In the event of cardiac or respiratory ARREST Do not call a "code blue"   In the event of cardiac or respiratory ARREST Do not perform Intubation, CPR, defibrillation or ACLS   In the event of cardiac or respiratory ARREST Use medication by any route, position, wound care, and other measures to relive pain and suffering. May use oxygen, suction and manual treatment of airway obstruction as needed for comfort.      12/07/17 1459    Code Status History    Date Active Date Inactive Code Status Order ID Comments User Context   12/07/2017 14:46 12/07/2017 14:59 Full Code [id number]  Houston SirenSainani, Vivek J, MD Inpatient   03/28/2017 15:09 03/30/2017 21:26 DNR [id number]  Adrian SaranMody, Sital, MD Inpatient    Advance Directive Documentation     Most Recent Value  Type of Advance Directive  Healthcare Power of Attorney, Living will  Pre-existing out of facility DNR order (yellow form or pink MOST form)  No data  "MOST" Form in Place?  No data      Family Communication: Family  yesterday Disposition Plan: Rehab once breathing better  Time spent: 25 minutes   Zykia Walla [employer]  [facility]

## 2017-12-11 NOTE — Progress Notes (Signed)
Hypoglycemic Event  CBG: 52  Treatment: 4 oz orange juice  Symptoms: none  Follow-up CBG: Time:0802 CBG Result:77  Possible Reasons for Event: unknown  Comments/MD notified:Pt given 4 oz juice x1. At recheck patient has breakfast tray. Will notify MD about 70/30 scheduled insulin    [name] [name]

## 2017-12-11 NOTE — Progress Notes (Signed)
Pt complains of cough with thick green sputum witnessed by RN. MD notified. Pt states she will take delsym if ordered. I will continue to assess.

## 2017-12-11 NOTE — Care Management (Signed)
Barrier- more wheezing 12/10/2017 requiring IV steroids.  Recommendation for SNF and family is agreeable

## 2017-12-12 ENCOUNTER — Ambulatory Visit: Payer: Medicare Other | Admitting: Family Medicine

## 2017-12-12 DIAGNOSIS — J441 Chronic obstructive pulmonary disease with (acute) exacerbation: Secondary | ICD-10-CM | POA: Diagnosis not present

## 2017-12-12 DIAGNOSIS — R0602 Shortness of breath: Secondary | ICD-10-CM | POA: Diagnosis not present

## 2017-12-12 LAB — GLUCOSE, CAPILLARY
GLUCOSE-CAPILLARY: 67 mg/dL (ref 65–99)
Glucose-Capillary: 315 mg/dL — ABNORMAL HIGH (ref 65–99)
Glucose-Capillary: 360 mg/dL — ABNORMAL HIGH (ref 65–99)
Glucose-Capillary: 97 mg/dL (ref 65–99)
Glucose-Capillary: 119 mg/dL — ABNORMAL HIGH (ref 65–99)

## 2017-12-12 MED ORDER — INSULIN ASPART PROT & ASPART (70-30 MIX) 100 UNIT/ML ~~LOC~~ SUSP
32.0000 [IU] | Freq: Every day | SUBCUTANEOUS | Status: DC
  Administered 2017-12-12 – 2017-12-13 (×2): 32 [IU] via SUBCUTANEOUS
  Filled 2017-12-12 (×2): qty 1

## 2017-12-12 MED ORDER — DEXTROMETHORPHAN POLISTIREX ER 30 MG/5ML PO SUER: 15.0000 mg | Freq: Two times a day (BID) | ORAL | 0 refills | Status: DC | PRN

## 2017-12-12 MED ORDER — BUDESONIDE 0.5 MG/2ML IN SUSP: 0.5000 mg | Freq: Two times a day (BID) | RESPIRATORY_TRACT | 0 refills | Status: AC

## 2017-12-12 MED ORDER — ACETAMINOPHEN 325 MG PO TABS: 650.0000 mg | ORAL_TABLET | Freq: Four times a day (QID) | ORAL | Status: DC | PRN

## 2017-12-12 MED ORDER — ALPRAZOLAM 0.25 MG PO TABS: 0.2500 mg | ORAL_TABLET | Freq: Two times a day (BID) | ORAL | 0 refills | Status: DC | PRN

## 2017-12-12 MED ORDER — POLYETHYLENE GLYCOL 3350 17 G PO PACK: 17.0000 g | PACK | Freq: Every day | ORAL | 0 refills | Status: DC

## 2017-12-12 MED ORDER — IPRATROPIUM-ALBUTEROL 0.5-2.5 (3) MG/3ML IN SOLN: 3.0000 mL | RESPIRATORY_TRACT | 0 refills | Status: DC

## 2017-12-12 MED ORDER — INSULIN ASPART PROT & ASPART (70-30 MIX) 100 UNIT/ML ~~LOC~~ SUSP: 32.0000 [IU] | Freq: Every day | SUBCUTANEOUS | 0 refills | Status: DC

## 2017-12-12 MED ORDER — COLLAGENASE 250 UNIT/GM EX OINT: TOPICAL_OINTMENT | Freq: Every day | CUTANEOUS | 0 refills | Status: DC

## 2017-12-12 MED ORDER — PREDNISONE 10 MG PO TABS: ORAL_TABLET | ORAL | 0 refills | Status: DC

## 2017-12-12 MED ORDER — INSULIN ASPART PROT & ASPART (70-30 MIX) 100 UNIT/ML ~~LOC~~ SUSP: 18.0000 [IU] | Freq: Every day | SUBCUTANEOUS | 0 refills | Status: DC

## 2017-12-12 NOTE — Progress Notes (Signed)
Patient ID: Karen [name], female   DOB: 09-24-1941, 77 y.o.   MRN: [id number]   [facility] PROGRESS NOTE  Karen [name] WUJ:[id number] DOB: 09-24-1941 DOA: 12/07/2017 PCP: Ellyn HackShah, Syed Asad A, MD  HPI/Subjective: Patient stating she is feeling better.  Her breathing is better.  Last night coughed up quite a bit of phlegm and breathing better today.  Objective: Vitals:   12/12/17 0738 12/12/17 1625  BP:  (!) 145/65  Pulse:  87  Resp:  17  Temp:    SpO2: 98% 99%    Filed Weights   12/07/17 1900  Weight: 81.2 kg (179 lb 1.6 oz)    ROS: Review of Systems  Constitutional: Negative for chills and fever.  Eyes: Negative for blurred vision.  Respiratory: Positive for cough and shortness of breath. Negative for wheezing.   Cardiovascular: Negative for chest pain.  Gastrointestinal: Negative for abdominal pain, constipation, diarrhea, nausea and vomiting.  Genitourinary: Negative for dysuria.  Musculoskeletal: Negative for joint pain.  Neurological: Negative for dizziness and headaches.   Exam: Physical Exam  Constitutional: She is oriented to person, place, and time.  HENT:  Nose: No mucosal edema.  Mouth/Throat: No oropharyngeal exudate or posterior oropharyngeal edema.  Eyes: Conjunctivae, EOM and lids are normal. Pupils are equal, round, and reactive to light.  Neck: No JVD present. Carotid bruit is not present. No edema present. No thyroid mass and no thyromegaly present.  Cardiovascular: S1 normal and S2 normal. Exam reveals no gallop.  No murmur heard. Pulses:      Dorsalis pedis pulses are 2+ on the right side, and 2+ on the left side.  Respiratory: No respiratory distress. She has decreased breath sounds in the right lower field and the left lower field. She has wheezes in the left lower field. She has no rhonchi. She has no rales.  GI: Soft. Bowel sounds are normal. There is no tenderness.  Musculoskeletal:       Right ankle: She exhibits swelling.       Left ankle:  She exhibits swelling.  Lymphadenopathy:    She has no cervical adenopathy.  Neurological: She is alert and oriented to person, place, and time. No cranial nerve deficit.  Skin: Skin is warm. Nails show no clubbing.  Fifth toe right foot there is a scab.  Posterior right calf small little ulceration.  Psychiatric: She has a normal mood and affect.      Data Reviewed: Basic Metabolic Panel: Recent Labs  Lab 12/07/17 1509 12/11/17 0517  NA 139 147*  K 4.1 4.0  CL 99* 106  CO2 31 34*  GLUCOSE 230* 62*  BUN 28* 30*  CREATININE 1.21* 0.86  CALCIUM 8.5* 8.1*   CBC: Recent Labs  Lab 12/07/17 1509  WBC 8.7  HGB 11.4*  HCT 35.5  MCV 88.6  PLT 191    CBG: Recent Labs  Lab 12/11/17 1625 12/11/17 2107 12/12/17 0719 12/12/17 1123 12/12/17 1624  GLUCAP 131* 238* 315* 360* 97    Scheduled Meds: . budesonide (PULMICORT) nebulizer solution  0.5 mg Nebulization BID  . clopidogrel  75 mg Oral Daily  . collagenase   Topical Daily  . dextromethorphan  15 mg Oral BID  . digoxin  0.125 mg Oral QODAY  . enoxaparin (LOVENOX) injection  40 mg Subcutaneous Q24H  . ferrous sulfate  325 mg Oral QODAY  . furosemide  40 mg Oral Daily  . insulin aspart  0-20 Units Subcutaneous TID WC  . insulin aspart  0-5 Units Subcutaneous QHS  . insulin aspart protamine- aspart  18 Units Subcutaneous Q supper  . insulin aspart protamine- aspart  32 Units Subcutaneous Q breakfast  . ipratropium-albuterol  3 mL Nebulization Q4H  . levothyroxine  150 mcg Oral QAC breakfast  . losartan  100 mg Oral Daily  . mouth rinse  15 mL Mouth Rinse BID  . pantoprazole  40 mg Oral QAC breakfast  . polyethylene glycol  17 g Oral Daily  . raloxifene  60 mg Oral Daily  . sodium chloride flush  3 mL Intravenous Q12H  . Vitamin D (Ergocalciferol)  50,000 Units Oral Q7 days  . vitamin E  400 Units Oral Daily    Assessment/Plan:  1. COPD exacerbation.  Patient with more wheezing today.  Changed to prednisone  40 mg daily for tomorrow.  Continue budesonide and DuoNeb nebulizer solution.  2. Chronic respiratory failure back down to 2 L nasal cannula. 3. Type 2 diabetes with recent hypoglycemia.  Now have to increase 70/30 insulin. 4. Weakness.  Physical therapy recommended rehab.  Family also interested in assisted living long-term. 5. Essential hypertension.  with blood pressure on the lower side hold Norvasc at this point.  On losartan. 6. Hypothyroidism unspecified on levothyroxine 7. Hyperlipidemia unspecified.  With weakness hold pravastatin 8. GERD on Protonix   Code Status:     Code Status Orders  (From admission, onward)        Start     Ordered   12/07/17 1500  Do not attempt resuscitation (DNR)  Continuous    Question Answer Comment  In the event of cardiac or respiratory ARREST Do not call a "code blue"   In the event of cardiac or respiratory ARREST Do not perform Intubation, CPR, defibrillation or ACLS   In the event of cardiac or respiratory ARREST Use medication by any route, position, wound care, and other measures to relive pain and suffering. May use oxygen, suction and manual treatment of airway obstruction as needed for comfort.      12/07/17 1459    Code Status History    Date Active Date Inactive Code Status Order ID Comments User Context   12/07/2017 14:46 12/07/2017 14:59 Full Code [id number]  Houston Siren, MD Inpatient   03/28/2017 15:09 03/30/2017 21:26 DNR [id number]  Adrian Saran, MD Inpatient    Advance Directive Documentation     Most Recent Value  Type of Advance Directive  Healthcare Power of Attorney, Living will  Pre-existing out of facility DNR order (yellow form or pink MOST form)  No data  "MOST" Form in Place?  No data      Disposition Plan: Rehab once insurance company approves.  Time spent: 35 minutes   Raeden Schippers [employer]

## 2017-12-12 NOTE — Discharge Summary (Addendum)
[facility] - [place] [facility]   PATIENT NAME: Karen [name]    MR#:  [id number]  DATE OF BIRTH:  04-Dec-1941  DATE OF ADMISSION:  12/07/2017 ADMITTING PHYSICIAN: Houston Siren, MD  DATE OF DISCHARGE: 12/13/2016  PRIMARY CARE PHYSICIAN: Ellyn Hack, MD    ADMISSION DIAGNOSIS:  ARF w hypoxia  DISCHARGE DIAGNOSIS:  Active Problems:   COPD exacerbation (HCC)   SECONDARY DIAGNOSIS:   Past Medical History:  Diagnosis Date  . COPD (chronic obstructive pulmonary disease) (HCC)   . Diabetes mellitus without complication (HCC)   . Hyperlipidemia   . Hypertension     HOSPITAL COURSE:   1.  COPD exacerbation.  The patient had wheezing throughout the hospital stay.  Patient was on high-dose Solu-Medrol.  The patient is on DuoNeb nebulizer solution every 4 hours.  The patient is on budesonide nebulizer solution twice a day.  The patient does take chronic steroids so the patient will have a slow taper of her steroids.  I prescribed prednisone 40 mg daily for 5 days then 30 mg daily for 5 days then 20 mg daily for 5 days then 10 mg daily lifelong.  2.  End-stage COPD and chronic respiratory failure the patient is down to 2 L nasal cannula.  Patient also on chronic prednisone 3.  Type 2 diabetes mellitus.  She did have an episode of relative hypoglycemia.  I did decrease her 70/30 insulin but then had to increase it again because her sugars went high.  Currently I have her on 32 units in the morning and 16 units in the evening of 70/30 insulin.  I did also give lunchtime short acting insulin coverage. Please titrate as needed.  As steroids are tapered may have to titrate again. 4.  Weakness.  Physical therapy recommended rehab.  Family also interested in long-term plans.  Potentially assisted living depending on her status when done with her rehab days. 5.  Essential hypertension.  Hold Norvasc.  Continue losartan 6.  Hypothyroidism unspecified on levothyroxine 7.   Hyperlipidemia unspecified on pravastatin 8.  GERD on PPI   DISCHARGE CONDITIONS:   Satisfactory  CONSULTS OBTAINED:  None  DRUG ALLERGIES:   Allergies  Allergen Reactions  . Ace Inhibitors Other (See Comments)    Cough   . Singulair [Montelukast Sodium]     DISCHARGE MEDICATIONS:   Allergies as of 12/13/2017      Reactions   Ace Inhibitors Other (See Comments)   Cough   Singulair [montelukast Sodium]       Medication List    STOP taking these medications   amLODipine 5 MG tablet Commonly known as:  NORVASC   HUMULIN 70/30 (70-30) 100 UNIT/ML injection Generic drug:  insulin NPH-regular Human   insulin lispro 100 UNIT/ML KiwkPen Commonly known as:  HUMALOG KWIKPEN     TAKE these medications   acetaminophen 325 MG tablet Commonly known as:  TYLENOL Take 2 tablets (650 mg total) by mouth every 6 (six) hours as needed for mild pain (or Fever >/= 101).   ALPRAZolam 0.25 MG tablet Commonly known as:  XANAX Take 1 tablet (0.25 mg total) by mouth 2 (two) times daily as needed for anxiety.   B-D INS SYRINGE 0.5CC/31GX5/16 31G X 5/16" 0.5 ML Misc Generic drug:  Insulin Syringe-Needle U-100 USE TO INJECT INSULIN TWICE DAILY.   budesonide 0.5 MG/2ML nebulizer solution Commonly known as:  PULMICORT Take 2 mLs (0.5 mg total) by nebulization 2 (two) times  daily.   clopidogrel 75 MG tablet Commonly known as:  PLAVIX TAKE 1 TABLET BY MOUTH ONCE DAILY   collagenase ointment Commonly known as:  SANTYL Apply topically daily.   dextromethorphan 30 MG/5ML liquid Commonly known as:  DELSYM Take 2.5 mLs (15 mg total) by mouth 2 (two) times daily as needed for cough.   digoxin 0.125 MG tablet Commonly known as:  LANOXIN Take 0.125 mg by mouth every other day.   diphenhydrAMINE 25 mg capsule Commonly known as:  BENADRYL Take 1 capsule (25 mg total) by mouth 2 (two) times daily as needed for itching or allergies.   ferrous sulfate 325 (65 FE) MG EC tablet Take 325  mg by mouth every other day.   furosemide 40 MG tablet Commonly known as:  LASIX Take 40 mg by mouth 2 (two) times daily.   insulin aspart 100 UNIT/ML injection Commonly known as:  novoLOG lunch time coverage only. For sugar 121-150 3 units, 151-200 4 units; 210-250 6 units; 251-300 9 units; 301- 350 12 units; greater than 351 16 units   insulin aspart protamine- aspart (70-30) 100 UNIT/ML injection Commonly known as:  NOVOLOG MIX 70/30 Inject 0.32 mLs (32 Units total) into the skin daily with breakfast.   insulin aspart protamine- aspart (70-30) 100 UNIT/ML injection Commonly known as:  NOVOLOG MIX 70/30 Inject 0.16 mLs (16 Units total) into the skin daily with supper.   Insulin Pen Needle 31G X 6 MM Misc Commonly known as:  UNIFINE PENTIPS USE AS DIRECTED   ipratropium-albuterol 0.5-2.5 (3) MG/3ML Soln Commonly known as:  DUONEB Take 3 mLs by nebulization every 4 (four) hours. What changed:  See the new instructions.   ketoconazole 2 % cream Commonly known as:  NIZORAL Apply 1 application 2 (two) times daily as needed topically for irritation.   levothyroxine 150 MCG tablet Commonly known as:  SYNTHROID, LEVOTHROID Take 1 tablet (150 mcg total) daily before breakfast by mouth.   losartan 100 MG tablet Commonly known as:  COZAAR TAKE 1 TABLET BY MOUTH ONCE DAILY   pantoprazole 40 MG tablet Commonly known as:  PROTONIX TAKE 1 TABLET BY MOUTH ONCE DAILY   polyethylene glycol packet Commonly known as:  MIRALAX / GLYCOLAX Take 17 g by mouth daily.   pravastatin 40 MG tablet Commonly known as:  PRAVACHOL Take 1 tablet (40 mg total) by mouth daily.   predniSONE 10 MG tablet Commonly known as:  DELTASONE 4 tabs po daily for 5 days then 3 tabs po daily for 5 days then 2 tabs po daily for 5 days then 1 tab po daily lifelong What changed:    how much to take  how to take this  when to take this  additional instructions  Another medication with the same name was  removed. Continue taking this medication, and follow the directions you see here.   raloxifene 60 MG tablet Commonly known as:  EVISTA TAKE 1 TABLET BY MOUTH ONCE DAILY   VENTOLIN HFA 108 (90 Base) MCG/ACT inhaler Generic drug:  albuterol Inhale 2 puffs into the lungs every 6 (six) hours as needed.   Vitamin D (Ergocalciferol) 50000 units Caps capsule Commonly known as:  DRISDOL Take 1 capsule (50,000 Units total) by mouth every 7 (seven) days.   vitamin E 400 UNIT capsule Take 400 Units by mouth daily.        DISCHARGE INSTRUCTIONS:   Follow-up with Dr. at rehab 1 day  If you experience worsening of your admission  symptoms, develop shortness of breath, life threatening emergency, suicidal or homicidal thoughts you must seek medical attention immediately by calling 911 or calling your MD immediately  if symptoms less severe.  You Must read complete instructions/literature along with all the possible adverse reactions/side effects for all the Medicines you take and that have been prescribed to you. Take any new Medicines after you have completely understood and accept all the possible adverse reactions/side effects.   Please note  You were cared for by a hospitalist during your hospital stay. If you have any questions about your discharge medications or the care you received while you were in the hospital after you are discharged, you can call the unit and asked to speak with the hospitalist on call if the hospitalist that took care of you is not available. Once you are discharged, your primary care physician will handle any further medical issues. Please note that NO REFILLS for any discharge medications will be authorized once you are discharged, as it is imperative that you return to your primary care physician (or establish a relationship with a primary care physician if you do not have one) for your aftercare needs so that they can reassess your need for medications and monitor  your lab values.    Today   CHIEF COMPLAINT:  No chief complaint on file.   HISTORY OF PRESENT ILLNESS:  Takari Duncombe  is a 77 y.o. female with a known history of COPD presents with COPD exacerbation   VITAL SIGNS:  Blood pressure 119/62, pulse 96, temperature 97.9 F (36.6 C), temperature source Oral, resp. rate 16, height 4\' 11"  (1.499 m), weight 81.4 kg (179 lb 6.4 oz), SpO2 95 %.    PHYSICAL EXAMINATION:  GENERAL:  77 y.o.-year-old patient lying in the bed with no acute distress.  EYES: Pupils equal, round, reactive to light and accommodation. No scleral icterus. Extraocular muscles intact.  HEENT: Head atraumatic, normocephalic. Oropharynx and nasopharynx clear.  NECK:  Supple, no jugular venous distention. No thyroid enlargement, no tenderness.  LUNGS: Decreased breath sounds bilaterally, no wheezing, rales,rhonchi or crepitation. No use of accessory muscles of respiration.  CARDIOVASCULAR: S1, S2 normal. No murmurs, rubs, or gallops.  ABDOMEN: Soft, non-tender, non-distended. Bowel sounds present. No organomegaly or mass.  EXTREMITIES: No pedal edema, cyanosis, or clubbing.  NEUROLOGIC: Cranial nerves II through XII are intact. Muscle strength 5/5 in all extremities. Sensation intact. Gait not checked.  PSYCHIATRIC: The patient is alert and oriented x 3.  SKIN: Right calf small little ulceration.  Right fifth toe scab seen.  Chronic lower extremity discoloration  DATA REVIEW:   CBC Recent Labs  Lab 12/07/17 1509  WBC 8.7  HGB 11.4*  HCT 35.5  PLT 191    Chemistries  Recent Labs  Lab 12/11/17 0517  NA 147*  K 4.0  CL 106  CO2 34*  GLUCOSE 62*  BUN 30*  CREATININE 0.86  CALCIUM 8.1*      Management plans discussed with the patient, and she is in agreement.  CODE STATUS:     Code Status Orders  (From admission, onward)        Start     Ordered   12/07/17 1500  Do not attempt resuscitation (DNR)  Continuous    Question Answer Comment  In the  event of cardiac or respiratory ARREST Do not call a "code blue"   In the event of cardiac or respiratory ARREST Do not perform Intubation, CPR, defibrillation or ACLS  In the event of cardiac or respiratory ARREST Use medication by any route, position, wound care, and other measures to relive pain and suffering. May use oxygen, suction and manual treatment of airway obstruction as needed for comfort.      12/07/17 1459    Code Status History    Date Active Date Inactive Code Status Order ID Comments User Context   12/07/2017 14:46 12/07/2017 14:59 Full Code [id number]  Houston Siren, MD Inpatient   03/28/2017 15:09 03/30/2017 21:26 DNR [id number]  Adrian Saran, MD Inpatient    Advance Directive Documentation     Most Recent Value  Type of Advance Directive  Healthcare Power of Attorney, Living will  Pre-existing out of facility DNR order (yellow form or pink MOST form)  No data  "MOST" Form in Place?  No data      TOTAL TIME TAKING CARE OF THIS PATIENT: 35 minutes.    Alford Highland M.D on 12/13/2017 at 8:12 AM  Between 7am to 6pm - Pager - [phone]  After 6pm go to [email or web address] - password [email or web address]  [facility] Office  [phone]  CC: Primary care physician; Ellyn Hack, MD

## 2017-12-12 NOTE — Care Management Important Message (Signed)
Important Message  Patient Details  Name: Karen [name] MRN: [id number] Date of Birth: 14-Aug-1941   Medicare Important Message Given:  Yes Signed IM notice given    Eber HongGreene, Fina Heizer R, RN 12/12/2017, 10:17 AM

## 2017-12-12 NOTE — Progress Notes (Signed)
Physical Therapy Treatment Patient Details Name: Karen [name] M Yandow MRN: [id number] DOB: 09-21-1941 Today's Date: 12/12/2017    History of Present Illness 77 yo female home alone and has SOB, bronchitis, home O2 use, hypoxic with COPD exacerbation.  PMHx: COPD, DM, HTN, HLD, chronic respiratory failure on O2, hypothyroidism, osteoporosis, anxiety    PT Comments    Pt voicing need to use bathroom.  Pt able to get out of bed with rail and increased time.  She ambulated to/from bathroom with rolling walker and min guard.  She required assist for O2 cord management with decreased awareness when it was behind walker wheels.  After return to recliner, she was able to rest for several minutes then ambulate to/from door.  No LOB or buckling noted.  Pt overall tolerated well but was generally limited by fatigue.  Stated at home she rests often "I have to rest because of my COPD".  SNF remains appropriate at this time to increase overall functional mobility skills and safety awareness/cord management to make possible transition to ALF a success.   Follow Up Recommendations  SNF     Equipment Recommendations  None recommended by PT    Recommendations for Other Services       Precautions / Restrictions Precautions Precautions: Fall Precaution Comments: O2 continual need Restrictions Weight Bearing Restrictions: No    Mobility  Bed Mobility Overal bed mobility: Modified Independent Bed Mobility: Supine to Sit     Supine to sit: Modified independent (Device/Increase time)     General bed mobility comments: increased time and rail.  Assist for blankets  Transfers Overall transfer level: Needs assistance Equipment used: Rolling walker (2 wheeled);1 person hand held assist Transfers: Sit to/from Stand Sit to Stand: Supervision            Ambulation/Gait Ambulation/Gait assistance: Min guard Ambulation Distance (Feet): 30 Feet Assistive device: Rolling walker (2 wheeled) Gait  Pattern/deviations: Step-through pattern;Decreased step length - right;Decreased step length - left;Trunk flexed;Wide base of support   Gait velocity interpretation: Below normal speed for age/gender     Stairs            Wheelchair Mobility    Modified Rankin (Stroke Patients Only)       Balance Overall balance assessment: Needs assistance Sitting-balance support: Feet supported Sitting balance-Leahy Scale: Good     Standing balance support: Bilateral upper extremity supported Standing balance-Leahy Scale: Fair                              Cognition Arousal/Alertness: Awake/alert Behavior During Therapy: WFL for tasks assessed/performed Overall Cognitive Status: Within Functional Limits for tasks assessed                                        Exercises Other Exercises Other Exercises: to bathroom, independant self care.      General Comments        Pertinent Vitals/Pain Pain Assessment: No/denies pain    Home Living                      Prior Function            PT Goals (current goals can now be found in the care plan section) Progress towards PT goals: Progressing toward goals    Frequency    Min 2X/week  PT Plan Current plan remains appropriate    Co-evaluation              AM-PAC PT "6 Clicks" Daily Activity  Outcome Measure  Difficulty turning over in bed (including adjusting bedclothes, sheets and blankets)?: None Difficulty moving from lying on back to sitting on the side of the bed? : None Difficulty sitting down on and standing up from a chair with arms (e.g., wheelchair, bedside commode, etc,.)?: None Help needed moving to and from a bed to chair (including a wheelchair)?: A Little Help needed walking in hospital room?: A Little Help needed climbing 3-5 steps with a railing? : A Lot 6 Click Score: 20    End of Session Equipment Utilized During Treatment: Oxygen;Gait  belt Activity Tolerance: Patient limited by fatigue Patient left: in chair;with call bell/phone within reach;with family/visitor present         Time: 1610-9604 PT Time Calculation (min) (ACUTE ONLY): 23 min  Charges:  $Gait Training: 8-22 mins $Therapeutic Activity: 8-22 mins                    G Codes:       Danielle Dess, PTA 12/12/17, 9:37 AM

## 2017-12-12 NOTE — Progress Notes (Signed)
Per Minerva AreolaEric, CSW, discharging waiting on insurance authorization. Patient and family updated. No questions or complaints at this time.

## 2017-12-12 NOTE — Progress Notes (Signed)
Per Minerva AreolaEric SW, still unable to get insurance authorization today. Dr. Renae GlossWieting notified. Instructed to leave order as is; will continue trying for tomorrow. Patient updated.

## 2017-12-13 DIAGNOSIS — J441 Chronic obstructive pulmonary disease with (acute) exacerbation: Secondary | ICD-10-CM | POA: Diagnosis not present

## 2017-12-13 DIAGNOSIS — R0602 Shortness of breath: Secondary | ICD-10-CM | POA: Diagnosis not present

## 2017-12-13 LAB — GLUCOSE, CAPILLARY
GLUCOSE-CAPILLARY: 83 mg/dL (ref 65–99)
Glucose-Capillary: 332 mg/dL — ABNORMAL HIGH (ref 65–99)

## 2017-12-13 MED ORDER — INSULIN ASPART 100 UNIT/ML ~~LOC~~ SOLN: SUBCUTANEOUS | 0 refills | Status: DC

## 2017-12-13 MED ORDER — INSULIN ASPART PROT & ASPART (70-30 MIX) 100 UNIT/ML ~~LOC~~ SUSP: 16.0000 [IU] | Freq: Every day | SUBCUTANEOUS | 0 refills | Status: DC

## 2017-12-13 MED ORDER — IPRATROPIUM-ALBUTEROL 0.5-2.5 (3) MG/3ML IN SOLN
3.0000 mL | Freq: Four times a day (QID) | RESPIRATORY_TRACT | Status: DC
  Administered 2017-12-13: 3 mL via RESPIRATORY_TRACT
  Filled 2017-12-13: qty 3

## 2017-12-13 MED ORDER — PREDNISONE 20 MG PO TABS
40.0000 mg | ORAL_TABLET | Freq: Every day | ORAL | Status: DC
  Administered 2017-12-13: 40 mg via ORAL
  Filled 2017-12-13: qty 2

## 2017-12-13 NOTE — Clinical Social Work Placement (Signed)
   CLINICAL SOCIAL WORK PLACEMENT  NOTE  Date:  12/13/2017  Patient Details  Name: Karen [name] MRN: [id number] Date of Birth: [birth year]  Clinical Social Work is seeking post-discharge placement for this patient at the Skilled  Nursing Facility level of care (*CSW will initial, date and re-position this form in  chart as items are completed):  Yes   Patient/family provided with [facility] Clinical Social Work Department's list of facilities offering this level of care within the geographic area requested by the patient (or if unable, by the patient's family).  Yes   Patient/family informed of their freedom to choose among providers that offer the needed level of care, that participate in Medicare, Medicaid or managed care program needed by the patient, have an available bed and are willing to accept the patient.  Yes   Patient/family informed of [facility]'s ownership interest in [facility] and [facility], as well as of the fact that they are under no obligation to receive care at these facilities.  PASRR submitted to EDS on 12/10/17     PASRR number received on 12/10/17     Existing PASRR number confirmed on       FL2 transmitted to all facilities in geographic area requested by pt/family on 12/10/17     FL2 transmitted to all facilities within larger geographic area on       Patient informed that his/her managed care company has contracts with or will negotiate with certain facilities, including the following:        Yes   Patient/family informed of bed offers received.  [facility]     Physician recommends and patient chooses bed at      Patient to be transferred to [facility] [place] on 12/13/17.  Patient to be transferred to facility by [facility] [place] [facility]     Patient family notified on 12/13/17 of transfer.  Name of family member notified:  Karen [name] [phone] patient's son.     PHYSICIAN Please sign DNR      Additional Comment:    _______________________________________________ Darleene CleaverAnterhaus, Zamiyah Resendes R, LCSWA 12/13/2017, 4:19 PM

## 2017-12-13 NOTE — Clinical Social Work Note (Addendum)
CSW received phone call from [facility] that they have received authorization.  CSW updated patient's family that patient will be discharging today.    Patient to be d/c'ed today to [facility] [place].  Patient and family agreeable to plans will transport via ems RN to call report to room 803, [facility]all Nurse [phone].  Ervin KnackEric R. Latarsha Zani, MSW, Theresia MajorsLCSWA [phone]  12/13/2017 2:55 PM

## 2017-12-13 NOTE — Progress Notes (Signed)
Pt is being discharged to [facility]. Report called to Brittney at 1615. EMS notified of transfer at 1620. I will continue to assess.

## 2017-12-28 ENCOUNTER — Inpatient Hospital Stay: Payer: Medicare Other | Admitting: Family Medicine

## 2017-12-28 ENCOUNTER — Telehealth: Payer: Self-pay | Admitting: Family Medicine

## 2017-12-28 NOTE — Telephone Encounter (Signed)
Copied from CRM [id number]. Topic: Quick Communication - See Telephone Encounter >> Dec 28, 2017 11:10 AM Trula SladeWalter, Linda F wrote: CRM for notification. See Telephone encounter for:  12/28/17. Judeth CornfieldStephanie w/[facility] - Occupational Therapist needs an order for Ocupational Therapy for one time a week for one week, and two times a week for 3 weeks, and one times a week for one.

## 2017-12-28 NOTE — Telephone Encounter (Signed)
Reviewing her discharge summary from the hospital, I do not see any order for occupational or physical therapy, there is an order for her to go to rehabilitation and then possible assisted living. [facility]

## 2018-01-01 NOTE — Telephone Encounter (Signed)
Copied from CRM [phone]. Topic: General - Other >> Jan 01, 2018  3:17 PM Raquel SarnaHayes, Teresa G wrote: [facility]  [facility] [phone]  Verbal Orders: OT 1 time a week for 1  week 2 times a week for 3 weeks 1 time a week for 1 week

## 2018-01-02 NOTE — Telephone Encounter (Signed)
Called Stephanine and left a message on the number that was provided. to give me a call back regarding this matter

## 2018-01-03 NOTE — Telephone Encounter (Addendum)
Spoke to FlushingStephane and she states Dr. Sherryll BurgerShah verbalize  Authorized for skilled nursing. Looked in the chart and it was 10/03/2017 when it was authorized by Dr. Sherryll BurgerShah and verbal authorization message was sent to [facility]. Routed the conversation to Dr. Sherryll BurgerShah for Review.

## 2018-01-03 NOTE — Telephone Encounter (Signed)
Agree with the frequency of occupational therapy to be performed [facility]

## 2018-01-03 NOTE — Telephone Encounter (Signed)
Judeth CornfieldStephanie returned the call , please call her back in regards to this

## 2018-01-04 ENCOUNTER — Telehealth: Payer: Self-pay

## 2018-01-04 NOTE — Telephone Encounter (Signed)
Received a letter from her insurance regarding recall on losartan. Spoke to pt rand she states her pharmacist states it's not the losartan she is on. Pt states she would want to talk to Dr. Sherryll BurgerShah more about it at her next apt on Monday; 01/08/2018.

## 2018-01-08 ENCOUNTER — Ambulatory Visit (INDEPENDENT_AMBULATORY_CARE_PROVIDER_SITE_OTHER): Payer: Medicare Other | Admitting: Family Medicine

## 2018-01-08 ENCOUNTER — Encounter: Payer: Self-pay | Admitting: Family Medicine

## 2018-01-08 VITALS — BP 132/64 | HR 95 | Temp 97.9°F | Resp 18 | Ht 59.0 in | Wt 179.0 lb

## 2018-01-08 DIAGNOSIS — J441 Chronic obstructive pulmonary disease with (acute) exacerbation: Secondary | ICD-10-CM | POA: Diagnosis not present

## 2018-01-08 DIAGNOSIS — E1165 Type 2 diabetes mellitus with hyperglycemia: Secondary | ICD-10-CM | POA: Diagnosis not present

## 2018-01-08 DIAGNOSIS — I10 Essential (primary) hypertension: Secondary | ICD-10-CM

## 2018-01-08 DIAGNOSIS — IMO0002 Reserved for concepts with insufficient information to code with codable children: Secondary | ICD-10-CM

## 2018-01-08 DIAGNOSIS — E1121 Type 2 diabetes mellitus with diabetic nephropathy: Secondary | ICD-10-CM

## 2018-01-08 MED ORDER — TELMISARTAN 80 MG PO TABS: 80.0000 mg | ORAL_TABLET | Freq: Every day | ORAL | 0 refills | Status: DC

## 2018-01-08 NOTE — Progress Notes (Signed)
Name: Karen [name] M Walthall   MRN: [id number]    DOB: [birth year]   Date:01/08/2018       Progress Note  Subjective  Chief Complaint  Chief Complaint  Patient presents with  . Hospitalization Follow-up    SOB O2 in the high 70s. Struggle breathing   . Note for call alert    HPI  Pt. Presents for hospital follow up, she is recently discharged from the hospital for Acute Respiratory failure and COPD exacerbation, needed breathing treatment with Pulmicort, ventolin inhaler, Prednisone was increased to 60 mg for 5 days and tapering down to 10 mg which is her usual dosage.  She is also requesting a note for life alert to be used so she can get credit from her affordable housing apartment, she has life alert and wears it 24/7. She has history of COPD with multiple exacerbation and has Insulin dependent Diabetes Mellitus. Her recent fall was in 2016.    Past Medical History:  Diagnosis Date  . COPD (chronic obstructive pulmonary disease) (HCC)   . Diabetes mellitus without complication (HCC)   . Hyperlipidemia   . Hypertension     Past Surgical History:  Procedure Laterality Date  . CHOLECYSTECTOMY  1993    Family History  Problem Relation Age of Onset  . Heart disease Mother   . Heart disease Father   . Heart disease Sister   . Dementia Sister   . Heart disease Brother   . Diabetes Paternal Grandmother     Social History   Socioeconomic History  . Marital status: Single    Spouse name: Not on file  . Number of children: Not on file  . Years of education: Not on file  . Highest education level: Not on file  Social Needs  . Financial resource strain: Not on file  . Food insecurity - worry: Not on file  . Food insecurity - inability: Not on file  . Transportation needs - medical: Not on file  . Transportation needs - non-medical: Not on file  Occupational History  . Not on file  Tobacco Use  . Smoking status: Never Smoker  . Smokeless tobacco: Never Used  Substance and Sexual  Activity  . Alcohol use: No    Alcohol/week: 0.0 oz  . Drug use: No  . Sexual activity: No  Other Topics Concern  . Not on file  Social History Narrative  . Not on file     Current Outpatient Medications:  .  acetaminophen (TYLENOL) 325 MG tablet, Take 2 tablets (650 mg total) by mouth every 6 (six) hours as needed for mild pain (or Fever >/= 101)., Disp: , Rfl:  .  B-D INS SYRINGE 0.5CC/31GX5/16 31G X 5/16" 0.5 ML MISC, USE TO INJECT INSULIN TWICE DAILY., Disp: 100 each, Rfl: 6 .  clopidogrel (PLAVIX) 75 MG tablet, TAKE 1 TABLET BY MOUTH ONCE DAILY, Disp: 90 tablet, Rfl: 3 .  digoxin (LANOXIN) 0.125 MG tablet, Take 0.125 mg by mouth every other day. , Disp: , Rfl:  .  diphenhydrAMINE (BENADRYL) 25 mg capsule, Take 1 capsule (25 mg total) by mouth 2 (two) times daily as needed for itching or allergies., Disp: 10 capsule, Rfl: 0 .  ferrous sulfate 325 (65 FE) MG EC tablet, Take 325 mg by mouth every other day. , Disp: , Rfl:  .  furosemide (LASIX) 40 MG tablet, Take 40 mg by mouth 2 (two) times daily. , Disp: , Rfl:  .  HUMULIN 70/30 (70-30) 100 UNIT/ML  injection, Inject 50 units in the skin in the morning and 27 units in the skin in the evening., Disp: , Rfl:  .  Insulin Pen Needle (UNIFINE PENTIPS) 31G X 6 MM MISC, USE AS DIRECTED, Disp: 100 each, Rfl: 1 .  ipratropium-albuterol (DUONEB) 0.5-2.5 (3) MG/3ML SOLN, Take 3 mLs by nebulization every 4 (four) hours., Disp: 360 mL, Rfl: 0 .  ketoconazole (NIZORAL) 2 % cream, Apply 1 application 2 (two) times daily as needed topically for irritation., Disp: 30 g, Rfl: 0 .  levothyroxine (SYNTHROID, LEVOTHROID) 150 MCG tablet, Take 1 tablet (150 mcg total) daily before breakfast by mouth., Disp: 90 tablet, Rfl: 0 .  losartan (COZAAR) 100 MG tablet, TAKE 1 TABLET BY MOUTH ONCE DAILY, Disp: 90 tablet, Rfl: 0 .  pantoprazole (PROTONIX) 40 MG tablet, TAKE 1 TABLET BY MOUTH ONCE DAILY, Disp: 90 tablet, Rfl: 0 .  polyethylene glycol (MIRALAX / GLYCOLAX)  packet, Take 17 g by mouth daily., Disp: 30 each, Rfl: 0 .  pravastatin (PRAVACHOL) 40 MG tablet, Take 1 tablet (40 mg total) by mouth daily., Disp: 90 tablet, Rfl: 1 .  predniSONE (DELTASONE) 10 MG tablet, 4 tabs po daily for 5 days then 3 tabs po daily for 5 days then 2 tabs po daily for 5 days then 1 tab po daily lifelong, Disp: 60 tablet, Rfl: 0 .  raloxifene (EVISTA) 60 MG tablet, TAKE 1 TABLET BY MOUTH ONCE DAILY, Disp: 30 tablet, Rfl: 2 .  VENTOLIN HFA 108 (90 BASE) MCG/ACT inhaler, Inhale 2 puffs into the lungs every 6 (six) hours as needed. , Disp: , Rfl:  .  vitamin E 400 UNIT capsule, Take 400 Units by mouth daily., Disp: , Rfl:  .  ALPRAZolam (XANAX) 0.25 MG tablet, Take 1 tablet (0.25 mg total) by mouth 2 (two) times daily as needed for anxiety. (Patient not taking: Reported on 01/08/2018), Disp: 20 tablet, Rfl: 0 .  budesonide (PULMICORT) 0.5 MG/2ML nebulizer solution, Take 2 mLs (0.5 mg total) by nebulization 2 (two) times daily. (Patient not taking: Reported on 01/08/2018), Disp: 120 mL, Rfl: 0 .  collagenase (SANTYL) ointment, Apply topically daily. (Patient not taking: Reported on 01/08/2018), Disp: 15 g, Rfl: 0 .  dextromethorphan (DELSYM) 30 MG/5ML liquid, Take 2.5 mLs (15 mg total) by mouth 2 (two) times daily as needed for cough. (Patient not taking: Reported on 01/08/2018), Disp: 89 mL, Rfl: 0 .  insulin aspart (NOVOLOG) 100 UNIT/ML injection, lunch time coverage only. For sugar 121-150 3 units, 151-200 4 units; 210-250 6 units; 251-300 9 units; 301- 350 12 units; greater than 351 16 units (Patient not taking: Reported on 01/08/2018), Disp: 10 mL, Rfl: 0 .  insulin aspart protamine- aspart (NOVOLOG MIX 70/30) (70-30) 100 UNIT/ML injection, Inject 0.32 mLs (32 Units total) into the skin daily with breakfast. (Patient not taking: Reported on 01/08/2018), Disp: 10 mL, Rfl: 0 .  insulin aspart protamine- aspart (NOVOLOG MIX 70/30) (70-30) 100 UNIT/ML injection, Inject 0.16 mLs (16 Units total)  into the skin daily with supper. (Patient not taking: Reported on 01/08/2018), Disp: 10 mL, Rfl: 0 .  Vitamin D, Ergocalciferol, (DRISDOL) 50000 units CAPS capsule, Take 1 capsule (50,000 Units total) by mouth every 7 (seven) days. (Patient not taking: Reported on 01/08/2018), Disp: 12 capsule, Rfl: 0  Allergies  Allergen Reactions  . Ace Inhibitors Other (See Comments)    Cough   . Singulair [Montelukast Sodium]      ROS  Please see history of present illness  for complete discussion of ROS  Objective  Vitals:   01/08/18 1612  BP: 132/64  Pulse: 95  Resp: 18  Temp: 97.9 F (36.6 C)  TempSrc: Oral  SpO2: 98%  Weight: 179 lb (81.2 kg)  Height: 4\' 11"  (1.499 m)    Physical Exam  Constitutional: She is oriented to person, place, and time and well-developed, well-nourished, and in no distress.  Neurological: She is alert and oriented to person, place, and time.  Psychiatric: Mood, memory, affect and judgment normal.          Assessment & Plan  1. Chronic obstructive pulmonary disease with acute exacerbation [facility]) Reviewed notes from hospital discharge, she has completed 10 days at rehabilitation, interested in staying at home for now but may need referral for assisted living in future after her insurance situation is cleared  2. Essential hypertension Changed ARB to Micardis.  - telmisartan (MICARDIS) 80 MG tablet; Take 1 tablet (80 mg total) by mouth daily.  Dispense: 90 tablet; Refill: 0  3. 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   Kolton Kienle Asad A. [facility] 01/08/2018 4:24 PM

## 2018-01-09 ENCOUNTER — Ambulatory Visit: Payer: Self-pay | Admitting: *Deleted

## 2018-01-09 NOTE — Telephone Encounter (Signed)
Karen [name] Karen [name]- [facility]  Calling to report: Prednisone dosage has decreased to 10 mg/day from 20 mg -3 days ago. Patient is a little more fatigued after she walks to the bathroom. She is complaining that she is more fatigued and SOB. Chest pain with coughing- and she is coughing more with bloody sputum-all red not streaked(getting that almost every time.) Lung sounds diminished and clear. Patient had recall on Losartan and has been changed to Telmisartan 80 mg. Patient started new medication today at noon and BP reading is 82/50.  [phone]- Lorene Dyhristie

## 2018-01-09 NOTE — Telephone Encounter (Signed)
Spoke to patient and she states that her BP went back up she states she rather not DC telmisartan  at this time as she thinks it was due to not having enough fluid intake. Pt states she will record her BP readings with the nurse to make sure its where it suppose to be twice daily.Bp reading that she took at 3:35pm was 131/74  And Bp reading when she took it at 4:18pm is 130/64. Pt denies evaluation at this time. Pt understand if any new symptoms occur again  she should be evaluated.

## 2018-01-09 NOTE — Telephone Encounter (Signed)
If patient is hypotensive with blood pressure reading of 82/50, she can discontinue telmisartan at this time, increase fluid intake, have the nurse check blood pressures at least twice daily, she should follow-up in one week. It is unlikely that she would have shortness of breath and other pulmonary symptoms from decreasing the dosage of prednisone. She just completed a long high-dose prednisone taper. If these are new symptoms, I would suggest she be seen for evaluation and/or refer to pulmonology

## 2018-01-09 NOTE — Telephone Encounter (Signed)
Took New BP at noon and Karen [name] states that when she took her BP around 2 that was the reading she got. She is concern that since her prednisone was decreased  causing her SOB, coughing and chest pain. The nurse states that she asked her to drink more fluid to bring her BP up. Spoke to the patient and she states she drinking more water. Not experiencing any SOB right now but only when she moves a little.  Pt states she took nebulizer treatment every 6 hours. PT states she had some blood cough up yesterday and some bleeding in her nose but a little sore. PT has not checked her BP since the nurse came out. I suggest to check her BP after she drunk plenty of water and to make sure she write it down and I will call her back.

## 2018-01-11 ENCOUNTER — Other Ambulatory Visit: Payer: Self-pay | Admitting: Family Medicine

## 2018-01-11 DIAGNOSIS — K219 Gastro-esophageal reflux disease without esophagitis: Secondary | ICD-10-CM

## 2018-01-11 DIAGNOSIS — E039 Hypothyroidism, unspecified: Secondary | ICD-10-CM

## 2018-01-11 DIAGNOSIS — M810 Age-related osteoporosis without current pathological fracture: Secondary | ICD-10-CM

## 2018-01-15 ENCOUNTER — Telehealth: Payer: Self-pay

## 2018-01-15 ENCOUNTER — Other Ambulatory Visit: Payer: Self-pay

## 2018-01-15 ENCOUNTER — Ambulatory Visit: Payer: Medicare Other | Admitting: Podiatry

## 2018-01-15 MED ORDER — PREDNISONE 20 MG PO TABS: 20.0000 mg | ORAL_TABLET | Freq: Every day | ORAL | 1 refills | Status: DC

## 2018-01-15 NOTE — Telephone Encounter (Signed)
Pt requested to up her dose for prednisone because after she finished taking her medication her breathing started deteriorating.   Spoke with Dr. Freda MunroSaadat Khan and he agreed to change the medication from 10 to 20 mg daily.

## 2018-01-19 ENCOUNTER — Other Ambulatory Visit: Payer: Self-pay | Admitting: Family Medicine

## 2018-01-19 DIAGNOSIS — E7800 Pure hypercholesterolemia, unspecified: Secondary | ICD-10-CM

## 2018-01-22 ENCOUNTER — Telehealth: Payer: Self-pay | Admitting: Family Medicine

## 2018-01-22 MED ORDER — RANITIDINE HCL 150 MG PO TABS
150.0000 mg | ORAL_TABLET | Freq: Two times a day (BID) | ORAL | 1 refills | Status: DC | PRN
Start: 2018-01-22 — End: 2018-02-01

## 2018-01-22 NOTE — Telephone Encounter (Signed)
No Barrett's Stop pantoprazole Start ranitidine Explained caution about taking PPI for longer than 8 weeks No future appts yet I'm here to help

## 2018-02-01 ENCOUNTER — Telehealth: Payer: Self-pay | Admitting: Family Medicine

## 2018-02-01 MED ORDER — PANTOPRAZOLE SODIUM 20 MG PO TBEC: 20.0000 mg | DELAYED_RELEASE_TABLET | Freq: Every day | ORAL | 2 refills | Status: DC

## 2018-02-01 NOTE — Telephone Encounter (Signed)
Copied from CRM [phone]. Topic: Quick Communication - See Telephone Encounter >> Feb 01, 2018 10:58 AM Floria RavelingStovall, Shana A wrote: CRM for notification. See Telephone encounter for:  pt called in stated that she can not take the ranitidine (ZANTAC) 150 MG tablet [[id number]][[id number]].  It doesn't not work for her and would like to have this changed to something else?  She wanted to know if she has to take anything at all   Best number - [phone]   Pharmacy -[facility]  02/01/18.

## 2018-02-01 NOTE — Telephone Encounter (Signed)
Stop the ranitidine if not effective Start 20 mg protonix (pantoprazole) Call with any problems

## 2018-02-02 NOTE — Telephone Encounter (Signed)
Pt has scheduled an appt and will continue zantac for now

## 2018-02-07 ENCOUNTER — Other Ambulatory Visit: Payer: Self-pay | Admitting: Family Medicine

## 2018-02-07 NOTE — Telephone Encounter (Signed)
Copied from CRM [id number]. Topic: Quick Communication - Rx Refill/Question >> Feb 07, 2018  9:59 AM Lelon FrohlichGolden, Laurice Iglesia, RMA wrote: Medication: test strips for true meter   Has the patient contacted their pharmacy? yes   (Agent: If no, request that the patient contact the pharmacy for the refill.)   Preferred Pharmacy (with phone number or street name): [facility]   Agent: Please be advised that RX refills may take up to 3 business days. We ask that you follow-up with your pharmacy.

## 2018-02-08 ENCOUNTER — Other Ambulatory Visit: Payer: Self-pay

## 2018-02-08 ENCOUNTER — Emergency Department: Payer: Medicare Other

## 2018-02-08 ENCOUNTER — Encounter: Payer: Self-pay | Admitting: Emergency Medicine

## 2018-02-08 ENCOUNTER — Inpatient Hospital Stay
Admission: EM | Admit: 2018-02-08 | Discharge: 2018-02-10 | DRG: 292 | Disposition: A | Discharge status: Home Health | Payer: Medicare Other | Attending: Internal Medicine | Admitting: Internal Medicine

## 2018-02-08 DIAGNOSIS — E119 Type 2 diabetes mellitus without complications: Secondary | ICD-10-CM | POA: Diagnosis not present

## 2018-02-08 DIAGNOSIS — R7989 Other specified abnormal findings of blood chemistry: Secondary | ICD-10-CM

## 2018-02-08 DIAGNOSIS — J9610 Chronic respiratory failure, unspecified whether with hypoxia or hypercapnia: Secondary | ICD-10-CM | POA: Diagnosis not present

## 2018-02-08 DIAGNOSIS — J810 Acute pulmonary edema: Secondary | ICD-10-CM | POA: Diagnosis present

## 2018-02-08 DIAGNOSIS — Z66 Do not resuscitate: Secondary | ICD-10-CM | POA: Diagnosis not present

## 2018-02-08 DIAGNOSIS — E039 Hypothyroidism, unspecified: Secondary | ICD-10-CM | POA: Diagnosis present

## 2018-02-08 DIAGNOSIS — Z794 Long term (current) use of insulin: Secondary | ICD-10-CM | POA: Diagnosis not present

## 2018-02-08 DIAGNOSIS — I480 Paroxysmal atrial fibrillation: Secondary | ICD-10-CM | POA: Diagnosis not present

## 2018-02-08 DIAGNOSIS — Z9981 Dependence on supplemental oxygen: Secondary | ICD-10-CM | POA: Diagnosis not present

## 2018-02-08 DIAGNOSIS — Z79899 Other long term (current) drug therapy: Secondary | ICD-10-CM

## 2018-02-08 DIAGNOSIS — I509 Heart failure, unspecified: Secondary | ICD-10-CM

## 2018-02-08 DIAGNOSIS — R55 Syncope and collapse: Secondary | ICD-10-CM

## 2018-02-08 DIAGNOSIS — Z7902 Long term (current) use of antithrombotics/antiplatelets: Secondary | ICD-10-CM | POA: Diagnosis not present

## 2018-02-08 DIAGNOSIS — I5033 Acute on chronic diastolic (congestive) heart failure: Secondary | ICD-10-CM | POA: Diagnosis present

## 2018-02-08 DIAGNOSIS — I110 Hypertensive heart disease with heart failure: Principal | ICD-10-CM | POA: Diagnosis present

## 2018-02-08 DIAGNOSIS — E785 Hyperlipidemia, unspecified: Secondary | ICD-10-CM | POA: Diagnosis present

## 2018-02-08 DIAGNOSIS — R778 Other specified abnormalities of plasma proteins: Secondary | ICD-10-CM

## 2018-02-08 DIAGNOSIS — J449 Chronic obstructive pulmonary disease, unspecified: Secondary | ICD-10-CM | POA: Diagnosis not present

## 2018-02-08 LAB — CBC
HEMATOCRIT: 34.7 % — AB (ref 35.0–47.0)
HEMOGLOBIN: 11.1 g/dL — AB (ref 12.0–16.0)
MCH: 28.4 pg (ref 26.0–34.0)
MCHC: 32.1 g/dL (ref 32.0–36.0)
MCV: 88.4 fL (ref 80.0–100.0)
Platelets: 216 10*3/uL (ref 150–440)
RBC: 3.93 MIL/uL (ref 3.80–5.20)
RDW: 16.4 % — ABNORMAL HIGH (ref 11.5–14.5)
WBC: 11.5 10*3/uL — ABNORMAL HIGH (ref 3.6–11.0)

## 2018-02-08 LAB — URINALYSIS, COMPLETE (UACMP) WITH MICROSCOPIC
Bacteria, UA: NONE SEEN
Bilirubin Urine: NEGATIVE
Glucose, UA: NEGATIVE mg/dL
Hgb urine dipstick: NEGATIVE
KETONES UR: NEGATIVE mg/dL
LEUKOCYTES UA: NEGATIVE
Nitrite: NEGATIVE
PROTEIN: NEGATIVE mg/dL
Specific Gravity, Urine: 1.005 (ref 1.005–1.030)
pH: 6 (ref 5.0–8.0)

## 2018-02-08 LAB — BASIC METABOLIC PANEL
ANION GAP: 11 (ref 5–15)
BUN: 26 mg/dL — ABNORMAL HIGH (ref 6–20)
CHLORIDE: 103 mmol/L (ref 101–111)
CO2: 27 mmol/L (ref 22–32)
Calcium: 8.5 mg/dL — ABNORMAL LOW (ref 8.9–10.3)
Creatinine, Ser: 0.9 mg/dL (ref 0.44–1.00)
GFR calc non Af Amer: >60 mL/min (ref >60)
Glucose, Bld: 105 mg/dL — ABNORMAL HIGH (ref 65–99)
POTASSIUM: 4 mmol/L (ref 3.5–5.1)
Sodium: 141 mmol/L (ref 135–145)

## 2018-02-08 LAB — HEPATIC FUNCTION PANEL
ALT: 37 U/L (ref 14–54)
AST: 23 U/L (ref 15–41)
Albumin: 3.7 g/dL (ref 3.5–5.0)
Alkaline Phosphatase: 56 U/L (ref 38–126)
BILIRUBIN TOTAL: 0.8 mg/dL (ref 0.3–1.2)
Total Protein: 6.7 g/dL (ref 6.5–8.1)

## 2018-02-08 LAB — TSH: TSH: 0.175 u[IU]/mL — AB (ref 0.350–4.500)

## 2018-02-08 LAB — T4, FREE: FREE T4: 1.31 ng/dL — AB (ref 0.61–1.12)

## 2018-02-08 LAB — TROPONIN I: TROPONIN I: 0.29 ng/mL — AB (ref <0.03)

## 2018-02-08 LAB — DIGOXIN LEVEL: Digoxin Level: 0.5 ng/mL — ABNORMAL LOW (ref 0.8–2.0)

## 2018-02-08 LAB — GLUCOSE, CAPILLARY: Glucose-Capillary: 92 mg/dL (ref 65–99)

## 2018-02-08 LAB — BRAIN NATRIURETIC PEPTIDE: B Natriuretic Peptide: 739 pg/mL — ABNORMAL HIGH (ref 0.0–100.0)

## 2018-02-08 MED ORDER — CLOPIDOGREL BISULFATE 75 MG PO TABS
75.0000 mg | ORAL_TABLET | Freq: Every day | ORAL | Status: DC
  Administered 2018-02-09 – 2018-02-10 (×2): 75 mg via ORAL
  Filled 2018-02-08 (×2): qty 1

## 2018-02-08 MED ORDER — GLUCOSE BLOOD VI STRP: ORAL_STRIP | 5 refills | Status: AC

## 2018-02-08 MED ORDER — FERROUS SULFATE 325 (65 FE) MG PO TABS
325.0000 mg | ORAL_TABLET | Freq: Every day | ORAL | Status: DC
  Administered 2018-02-09 – 2018-02-10 (×2): 325 mg via ORAL
  Filled 2018-02-08 (×2): qty 1

## 2018-02-08 MED ORDER — INSULIN ASPART PROT & ASPART (70-30 MIX) 100 UNIT/ML ~~LOC~~ SUSP: 20.0000 [IU] | Freq: Two times a day (BID) | SUBCUTANEOUS | Status: DC

## 2018-02-08 MED ORDER — DIGOXIN 125 MCG PO TABS
0.1250 mg | ORAL_TABLET | ORAL | Status: DC
  Administered 2018-02-09: 0.125 mg via ORAL
  Filled 2018-02-08: qty 1

## 2018-02-08 MED ORDER — PREDNISONE 20 MG PO TABS
20.0000 mg | ORAL_TABLET | Freq: Every day | ORAL | Status: DC
  Administered 2018-02-09 – 2018-02-10 (×2): 20 mg via ORAL
  Filled 2018-02-08 (×2): qty 1

## 2018-02-08 MED ORDER — INSULIN ASPART 100 UNIT/ML ~~LOC~~ SOLN
0.0000 [IU] | Freq: Three times a day (TID) | SUBCUTANEOUS | Status: DC
Start: 2018-02-09 — End: 2018-02-09
  Administered 2018-02-09: 5 [IU] via SUBCUTANEOUS
  Administered 2018-02-09: 7 [IU] via SUBCUTANEOUS
  Filled 2018-02-08 (×2): qty 1

## 2018-02-08 MED ORDER — IPRATROPIUM-ALBUTEROL 0.5-2.5 (3) MG/3ML IN SOLN
3.0000 mL | RESPIRATORY_TRACT | Status: DC
  Administered 2018-02-09 (×3): 3 mL via RESPIRATORY_TRACT
  Filled 2018-02-08 (×3): qty 3

## 2018-02-08 MED ORDER — FUROSEMIDE 10 MG/ML IJ SOLN: 80.0000 mg | Freq: Once | INTRAMUSCULAR | Status: DC

## 2018-02-08 MED ORDER — BUDESONIDE 0.5 MG/2ML IN SUSP
0.5000 mg | Freq: Two times a day (BID) | RESPIRATORY_TRACT | Status: DC
  Administered 2018-02-09 – 2018-02-10 (×3): 0.5 mg via RESPIRATORY_TRACT
  Filled 2018-02-08 (×3): qty 2

## 2018-02-08 MED ORDER — LEVOTHYROXINE SODIUM 50 MCG PO TABS
75.0000 ug | ORAL_TABLET | Freq: Every day | ORAL | Status: DC
  Administered 2018-02-09 – 2018-02-10 (×2): 75 ug via ORAL
  Filled 2018-02-08 (×2): qty 1

## 2018-02-08 MED ORDER — IRBESARTAN 150 MG PO TABS
75.0000 mg | ORAL_TABLET | Freq: Every day | ORAL | Status: DC
  Administered 2018-02-10: 75 mg via ORAL
  Filled 2018-02-08 (×2): qty 1

## 2018-02-08 MED ORDER — IPRATROPIUM-ALBUTEROL 0.5-2.5 (3) MG/3ML IN SOLN
3.0000 mL | RESPIRATORY_TRACT | Status: DC
  Filled 2018-02-08: qty 3

## 2018-02-08 MED ORDER — IPRATROPIUM-ALBUTEROL 0.5-2.5 (3) MG/3ML IN SOLN
3.0000 mL | RESPIRATORY_TRACT | Status: DC
  Administered 2018-02-08: 3 mL via RESPIRATORY_TRACT

## 2018-02-08 MED ORDER — ALPRAZOLAM 0.25 MG PO TABS: 0.2500 mg | ORAL_TABLET | Freq: Two times a day (BID) | ORAL | Status: DC | PRN

## 2018-02-08 MED ORDER — FUROSEMIDE 10 MG/ML IJ SOLN
20.0000 mg | Freq: Three times a day (TID) | INTRAMUSCULAR | Status: DC
  Administered 2018-02-09 (×2): 20 mg via INTRAVENOUS
  Filled 2018-02-08 (×2): qty 2

## 2018-02-08 MED ORDER — HEPARIN SODIUM (PORCINE) 5000 UNIT/ML IJ SOLN
5000.0000 [IU] | Freq: Three times a day (TID) | INTRAMUSCULAR | Status: DC
  Administered 2018-02-09 – 2018-02-10 (×4): 5000 [IU] via SUBCUTANEOUS
  Filled 2018-02-08 (×4): qty 1

## 2018-02-08 MED ORDER — DOCUSATE SODIUM 100 MG PO CAPS: 100.0000 mg | ORAL_CAPSULE | Freq: Two times a day (BID) | ORAL | Status: DC | PRN

## 2018-02-08 MED ORDER — FUROSEMIDE 10 MG/ML IJ SOLN
40.0000 mg | Freq: Once | INTRAMUSCULAR | Status: AC
  Administered 2018-02-08: 40 mg via INTRAVENOUS
  Filled 2018-02-08: qty 4

## 2018-02-08 MED ORDER — RALOXIFENE HCL 60 MG PO TABS
60.0000 mg | ORAL_TABLET | Freq: Every day | ORAL | Status: DC
  Administered 2018-02-09 – 2018-02-10 (×2): 60 mg via ORAL
  Filled 2018-02-08 (×2): qty 1

## 2018-02-08 MED ORDER — PRAVASTATIN SODIUM 40 MG PO TABS
40.0000 mg | ORAL_TABLET | Freq: Every day | ORAL | Status: DC
  Administered 2018-02-09 – 2018-02-10 (×2): 40 mg via ORAL
  Filled 2018-02-08 (×2): qty 1

## 2018-02-08 NOTE — ED Triage Notes (Signed)
Has had multiple near syncope episodes over last week.  Did have recent change in BP med dosing but was decreased per daughter.  Has had SHOB especially with exertion.  Today started having aching pains in left arm.

## 2018-02-08 NOTE — ED Provider Notes (Signed)
[facility] Emergency Department Provider Note  ____________________________________________   First MD Initiated Contact with Patient 02/08/18 1849     (approximate)  I have reviewed the triage vital signs and the nursing notes.   HISTORY  Chief Complaint Near Syncope   HPI Karen [name] is a 77 y.o. female who comes to the emergency department with generalized malaise and multiple near syncopal episodes for the past week or so.  The patient says she has been increasingly out of breath when she ambulates she feels lightheaded and like she is going to pass out.  She has a complex past medical history including COPD and congestive heart failure along with atrial fibrillation.  She does report sleeping on 3 pillows in her bed is electronic and she sits up at night.  She reports increasing bilateral lower extremity edema and she has some blisters on her lower legs recently.  She also reports moderate severity exertional shortness of breath.  Her symptoms are insidious onset slowly progressive.  They are now moderate.  She has not yet actually passed out.  She reports compliance with her medications although her antihypertensives were changed about a month ago.  Past Medical History:  Diagnosis Date  . COPD (chronic obstructive pulmonary disease) (HCC)   . Diabetes mellitus without complication (HCC)   . Hyperlipidemia   . Hypertension     Patient Active Problem List   Diagnosis Date Noted  . CHF exacerbation (HCC) 02/08/2018  . History of PSVT (paroxysmal supraventricular tachycardia) 10/20/2017  . Sprain of ankle 10/20/2017  . Osteoporosis 08/22/2017  . COPD (chronic obstructive pulmonary disease) (HCC) 03/28/2017  . Swelling of limb 09/27/2016  . Lower limb ulcer, calf, left, limited to breakdown of skin (HCC) 09/27/2016  . Encounter related to need for assistance with personal care 04/13/2016  . Personal history of other diseases of the circulatory system  11/09/2015  . Easy bruising 10/20/2015  . COPD exacerbation (HCC) 10/19/2015  . Atrial fibrillation (HCC) 10/19/2015  . Diabetes mellitus (HCC) 10/19/2015  . 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 Proteinuria due to type 2 diabetes mellitus (HCC) 06/26/2015  . Morbid obesity due to excess calories (HCC) 06/26/2015  . Obesity, Class II, BMI 35-39.9 06/26/2015  . On continuous oral anticoagulation 06/26/2015  . Asthma with COPD (HCC) 06/26/2015  . Hyperlipidemia 06/26/2015  . Obstructive sleep apnea 06/26/2015  . CAFL (chronic airflow limitation) (HCC) 06/01/2015  . A-fib (HCC) 06/01/2015  . Diabetes (HCC) 06/01/2015  . History of prolonged Q-T interval on ECG 06/01/2015  . HLD (hyperlipidemia) 06/01/2015  . Hypertension 06/01/2015  . Obstructive apnea 06/01/2015    Past Surgical History:  Procedure Laterality Date  . CHOLECYSTECTOMY  1993    Prior to Admission medications   Medication Sig Start Date End Date Taking? Authorizing Provider  budesonide (PULMICORT) 0.5 MG/2ML nebulizer solution Take 2 mLs (0.5 mg total) by nebulization 2 (two) times daily. 12/12/17  Yes Wieting, Richard, MD  clopidogrel (PLAVIX) 75 MG tablet TAKE 1 TABLET BY MOUTH ONCE DAILY 08/09/17  Yes Brayton ElShah, Syed Asad A, MD  digoxin (LANOXIN) 0.125 MG tablet Take 0.125 mg by mouth every other day.    Yes [provider]  ferrous sulfate 325 (65 FE) MG EC tablet Take 325 mg by mouth daily.    Yes [provider]  furosemide (LASIX) 40 MG tablet Take 40 mg by mouth 2 (two) times daily.  08/09/17  Yes [provider]  glucose blood (TRUE METRIX BLOOD GLUCOSE TEST) test strip Dx E11.21, LON 99 months, check blood sugar three times a day 02/08/18  Yes Lada, Janit Bern, MD  HUMULIN 70/30 (70-30) 100 UNIT/ML injection Inject 50 units in the skin in the morning and 27 units in the skin in the evening. 12/26/17  Yes [provider]  insulin aspart (NOVOLOG) 100  UNIT/ML injection lunch time coverage only. For sugar 121-150 3 units, 151-200 4 units; 210-250 6 units; 251-300 9 units; 301- 350 12 units; greater than 351 16 units 12/13/17  Yes Wieting, Richard, MD  Insulin Pen Needle (UNIFINE PENTIPS) 31G X 6 MM MISC USE AS DIRECTED 10/25/17  Yes Brayton El Asad A, MD  ipratropium-albuterol (DUONEB) 0.5-2.5 (3) MG/3ML SOLN Take 3 mLs by nebulization every 4 (four) hours. 12/12/17  Yes Wieting, Richard, MD  levothyroxine (SYNTHROID, LEVOTHROID) 150 MCG tablet TAKE 1 TABLET BY MOUTH ONCE DAILY ON AN EMPTY STOMACH. WAIT 30 MINUTES BEFORE TAKING OTHER MEDS. 01/11/18  Yes Brayton El Asad A, MD  pravastatin (PRAVACHOL) 40 MG tablet TAKE 1 TABLET BY MOUTH ONCE DAILY 01/19/18  Yes Ellyn Hack, MD  predniSONE (DELTASONE) 20 MG tablet Take 1 tablet (20 mg total) by mouth daily with breakfast. 01/15/18  Yes Yevonne Pax, MD  raloxifene (EVISTA) 60 MG tablet TAKE 1 TABLET BY MOUTH ONCE DAILY 01/11/18  Yes Ellyn Hack, MD  ranitidine (ZANTAC) 150 MG tablet Take 150 mg by mouth 2 (two) times daily.   Yes [provider]  telmisartan (MICARDIS) 80 MG tablet Take 1 tablet (80 mg total) by mouth daily. 01/08/18  Yes Ellyn Hack, MD  VENTOLIN HFA 108 (90 BASE) MCG/ACT inhaler Inhale 2 puffs into the lungs every 6 (six) hours as needed.  05/08/15  Yes [provider]  vitamin E 400 UNIT capsule Take 400 Units by mouth daily.   Yes [provider]  acetaminophen (TYLENOL) 325 MG tablet Take 2 tablets (650 mg total) by mouth every 6 (six) hours as needed for mild pain (or Fever >/= 101). Patient not taking: Reported on 02/08/2018 12/12/17   Alford Highland, MD  ALPRAZolam Prudy Feeler) 0.25 MG tablet Take 1 tablet (0.25 mg total) by mouth 2 (two) times daily as needed for anxiety. Patient not taking: Reported on 02/08/2018 12/12/17   Alford Highland, MD  collagenase (SANTYL) ointment Apply topically daily. Patient not taking: Reported on 01/08/2018 12/13/17    Alford Highland, MD  dextromethorphan (DELSYM) 30 MG/5ML liquid Take 2.5 mLs (15 mg total) by mouth 2 (two) times daily as needed for cough. Patient not taking: Reported on 01/08/2018 12/12/17   Alford Highland, MD  diphenhydrAMINE (BENADRYL) 25 mg capsule Take 1 capsule (25 mg total) by mouth 2 (two) times daily as needed for itching or allergies. Patient not taking: Reported on 02/08/2018 03/30/17   Altamese Dilling, MD  ketoconazole (NIZORAL) 2 % cream Apply 1 application 2 (two) times daily as needed topically for irritation. Patient not taking: Reported on 02/08/2018 10/20/17   Doren Custard, FNP  pantoprazole (PROTONIX) 20 MG tablet Take 1 tablet (20 mg total) by mouth daily. Patient not taking: Reported on 02/08/2018 02/01/18   Kerman Passey, MD  polyethylene glycol (MIRALAX / GLYCOLAX) packet Take 17 g by mouth daily. Patient not taking: Reported on 02/08/2018 12/13/17   Alford Highland, MD  Vitamin D, Ergocalciferol, (DRISDOL) 50000 units CAPS capsule Take 1 capsule (50,000 Units total) by mouth every 7 (seven) days.  Patient not taking: Reported on 01/08/2018 09/12/17   Ellyn Hack, MD    Allergies Ace inhibitors and Singulair [montelukast sodium]  Family History  Problem Relation Age of Onset  . Heart disease Mother   . Heart disease Father   . Heart disease Sister   . Dementia Sister   . Heart disease Brother   . Diabetes Paternal Grandmother     Social History Social History   Tobacco Use  . Smoking status: Never Smoker  . Smokeless tobacco: Never Used  Substance Use Topics  . Alcohol use: No    Alcohol/week: 0.0 oz  . Drug use: No    Review of Systems Constitutional: No fever/chills Eyes: No visual changes. ENT: No sore throat. Cardiovascular: Denies chest pain. Respiratory: Positive for shortness of breath. Gastrointestinal: No abdominal pain.  No nausea, no vomiting.  No diarrhea.  No constipation. Genitourinary: Negative for dysuria. Musculoskeletal:  Negative for back pain. Skin: Negative for rash. Neurological: Negative for headaches, focal weakness or numbness.   ____________________________________________   PHYSICAL EXAM:  VITAL SIGNS: ED Triage Vitals  Enc Vitals Group     BP 02/08/18 1826 (!) 101/46     Pulse Rate 02/08/18 1826 89     Resp 02/08/18 1826 (!) 22     Temp 02/08/18 1826 98.5 F (36.9 C)     Temp Source 02/08/18 1826 Oral     SpO2 02/08/18 1826 90 %     Weight 02/08/18 1830 172 lb (78 kg)     Height 02/08/18 1830 4\' 11"  (1.499 m)     Head Circumference --      Peak Flow --      Pain Score 02/08/18 1839 2     Pain Loc --      Pain Edu? --      Excl. in GC? --     Constitutional: Alert and oriented x4 appears somewhat short of breath nontoxic no diaphoresis speaks in full clear sentences Eyes: PERRL EOMI. Head: Atraumatic. Nose: No congestion/rhinnorhea. Mouth/Throat: No trismus Neck: No stridor.  Able to lie completely flat although with some JVD Cardiovascular: Irregularly irregular although normal heart sounds Respiratory: Increased respiratory effort with crackles at bases Gastrointestinal: Obese soft nontender Musculoskeletal: 2+ pitting edema to midshin bilaterally legs equal in size Neurologic:  Normal speech and language. No gross focal neurologic deficits are appreciated. Skin: Clear bulla to left greater than right lower extremities Psychiatric: Mood and affect are normal. Speech and behavior are normal.    ____________________________________________   DIFFERENTIAL includes but not limited to  Acute pulmonary edema, vasovagal syncope, cardiogenic syncope, acute coronary syndrome, pulmonary embolism ____________________________________________   LABS (all labs ordered are listed, but only abnormal results are displayed)  Labs Reviewed  BASIC METABOLIC PANEL - Abnormal; Notable for the following components:      Result Value   Glucose, Bld 105 (*)    BUN 26 (*)    Calcium 8.5  (*)    All other components within normal limits  CBC - Abnormal; Notable for the following components:   WBC 11.5 (*)    Hemoglobin 11.1 (*)    HCT 34.7 (*)    RDW 16.4 (*)    All other components within normal limits  URINALYSIS, COMPLETE (UACMP) WITH MICROSCOPIC - Abnormal; Notable for the following components:   Color, Urine STRAW (*)    APPearance CLEAR (*)    Squamous Epithelial / LPF 0-5 (*)    All other components within  normal limits  TROPONIN I - Abnormal; Notable for the following components:   Troponin I 0.29 (*)    All other components within normal limits  DIGOXIN LEVEL - Abnormal; Notable for the following components:   Digoxin Level 0.5 (*)    All other components within normal limits  TSH - Abnormal; Notable for the following components:   TSH 0.175 (*)    All other components within normal limits  T4, FREE - Abnormal; Notable for the following components:   Free T4 1.31 (*)    All other components within normal limits  HEPATIC FUNCTION PANEL - Abnormal; Notable for the following components:   Bilirubin, Direct <0.1 (*)    All other components within normal limits  BRAIN NATRIURETIC PEPTIDE - Abnormal; Notable for the following components:   B Natriuretic Peptide 739.0 (*)    All other components within normal limits  CBG MONITORING, ED    Lab work reviewed by me with elevated BNP and elevated troponin concerning for acute pulmonary edema __________________________________________  EKG  ED ECG REPORT I, Merrily Brittle, the attending physician, personally viewed and interpreted this ECG.  Date: 02/08/2018 EKG Time:  Rate: 96 Rhythm: Junctional rhythm QRS Axis: normal Intervals: normal ST/T Wave abnormalities: ST depression V3 V4 V5 with no elevation Narrative Interpretation: Concern for acute ischemia  ____________________________________________  RADIOLOGY  Chest x-ray reviewed by me with acute vascular  congestion ____________________________________________   PROCEDURES  Procedure(s) performed: no  .Critical Care Performed by: Merrily Brittle, MD Authorized by: Merrily Brittle, MD   Critical care provider statement:    Critical care time (minutes):  30   Critical care time was exclusive of:  Separately billable procedures and treating other patients   Critical care was necessary to treat or prevent imminent or life-threatening deterioration of the following conditions:  Respiratory failure and cardiac failure   Critical care was time spent personally by me on the following activities:  Development of treatment plan with patient or surrogate, discussions with consultants, evaluation of patient's response to treatment, examination of patient, obtaining history from patient or surrogate, ordering and performing treatments and interventions, ordering and review of laboratory studies, ordering and review of radiographic studies, pulse oximetry, re-evaluation of patient's condition and review of old charts    Critical Care performed: Yes  Observation: no ____________________________________________   INITIAL IMPRESSION / ASSESSMENT AND PLAN / ED COURSE  Pertinent labs & imaging results that were available during my care of the patient were reviewed by me and considered in my medical decision making (see chart for details).  The patient arrives quite short of breath with a story concerning for possible cardiogenic syncope.  She is clearly fluid overloaded with pulmonary edema and elevated troponin and BNP.  At this point the patient requires inpatient admission for continued telemetry, IV diuresis, and urgent cardiology evaluation.  I discussed with the patient and her family verbalized understanding and agreement with the plan.  I then discussed with the hospitalist who has graciously agreed to admit the patient to his service.       ____________________________________________   FINAL CLINICAL IMPRESSION(S) / ED DIAGNOSES  Final diagnoses:  Near syncope  Acute pulmonary edema (HCC)  Elevated troponin      NEW MEDICATIONS STARTED DURING THIS VISIT:  New Prescriptions   No medications on file     Note:  This document was prepared using Dragon voice recognition software and may include unintentional dictation errors.     Merrily Brittle, MD  02/08/18 2152  

## 2018-02-08 NOTE — Telephone Encounter (Signed)
Please enter the item requested with: Dx E11.21, LON 99 months, check blood sugar three times a day Thank you

## 2018-02-08 NOTE — Telephone Encounter (Signed)
When called pt to verify strips, she went into detail about how she did not feel well and that her bp had been dropping.  Spoke to Dr.Lada she recommended pt go to urgent care.  Pt refused so Dr Sherie DonLada tried talking to patient.

## 2018-02-08 NOTE — Telephone Encounter (Signed)
I'm sorry but I always have a hard time trying to find the correct name for the strips. I don't see true meter to put in order?

## 2018-02-08 NOTE — Telephone Encounter (Signed)
So I found true track, true focus, and true test (and maybe others); there are just too many We'll need to know exactly which one she needs please so call her and have her read to use exactly which one she needs so she doesn't get the wrong strips

## 2018-02-08 NOTE — ED Notes (Signed)
Pt given sandwich meal tray to take up to the floor.

## 2018-02-08 NOTE — Progress Notes (Signed)
Family Meeting Note  Advance Directive:yes  Today a meeting took place with the Patient and son.  The following clinical team members were present during this meeting:MD  The following were discussed:Patient's diagnosis:chronic diastolic CHF, terminal COPD and chronic, oxygen use , Patient's progosis: Unable to determine and Goals for treatment: DNR  Additional follow-up to be provided: PMD  Time spent during discussion:20 minutes  Altamese DillingVaibhavkumar Kyley Solow, MD

## 2018-02-08 NOTE — Telephone Encounter (Signed)
I talked with the patient about her symptoms; low bloos pressures; low sugars; ddx discussed, sepsis, pneumonia, dehydration, etc; urged her to go now to the ER, either call 911 and go by ambulance or call trusted friend or family member to take her now; she argued, did not want to go; she sounded like she could not even speak a full sentence because of shortness of breath; she says she was too sick, was going to just do a nebulizer; there was nothing they could do for her; I countered and said that she might need IV fluids, antibiotics, other treatments and that there certainly may be something that could be done other than just stopping the prednisone; I urged her to go now and she continued to argue

## 2018-02-08 NOTE — ED Notes (Signed)
Patient transported to X-ray 

## 2018-02-08 NOTE — H&P (Signed)
[employer] - [place] at [facility]   PATIENT NAME: Karen [name]    MR#:  [id number]  DATE OF BIRTH:  02/14/1941  DATE OF ADMISSION:  02/08/2018  PRIMARY CARE PHYSICIAN: Ellyn Hack, MD   REQUESTING/REFERRING PHYSICIAN: Rifenbark  CHIEF COMPLAINT:   Chief Complaint  Patient presents with  . Near Syncope    HISTORY OF PRESENT ILLNESS: Karen [name]  is a 77 y.o. female with a known history of COPD on chronic oxygen at home, diabetes, hyperlipidemia, hypertension- was admitted in hospital with COPD exacerbation, she was sent to rehabilitation with oxygen and long tapering steroids. She felt better with higher dose of steroid but after tapering she started feeling some short of breath so went to see her pulmonary doctor, Dr.Khan- he had increased her baseline steroids from 10 mg to 20 mg daily. She also takes 80 mg Lasix oral daily but drinks a lot of water at home almost 5-6, 16 ounce bottles every day. Last few days she has worsening shortness of breath and symptoms of orthopnea and exertional dyspnea with minimal walking only a few steps. She denies any cough, fever or chills, chest pain. In ER she is noted to have worsening of CHF and given to hospitalist team for further management.  PAST MEDICAL HISTORY:   Past Medical History:  Diagnosis Date  . COPD (chronic obstructive pulmonary disease) (HCC)   . Diabetes mellitus without complication (HCC)   . Hyperlipidemia   . Hypertension     PAST SURGICAL HISTORY:  Past Surgical History:  Procedure Laterality Date  . CHOLECYSTECTOMY  1993    SOCIAL HISTORY:  Social History   Tobacco Use  . Smoking status: Never Smoker  . Smokeless tobacco: Never Used  Substance Use Topics  . Alcohol use: No    Alcohol/week: 0.0 oz    FAMILY HISTORY:  Family History  Problem Relation Age of Onset  . Heart disease Mother   . Heart disease Father   . Heart disease Sister   . Dementia Sister   . Heart disease Brother   .  Diabetes Paternal Grandmother     DRUG ALLERGIES:  Allergies  Allergen Reactions  . Ace Inhibitors Other (See Comments)    Cough   . Singulair [Montelukast Sodium]     REVIEW OF SYSTEMS:   CONSTITUTIONAL: No fever, fatigue or weakness.  EYES: No blurred or double vision.  EARS, NOSE, AND THROAT: No tinnitus or ear pain.  RESPIRATORY: No cough,have shortness of breath, wheezing or hemoptysis.  CARDIOVASCULAR: No chest pain, have orthopnea, edema.  GASTROINTESTINAL: No nausea, vomiting, diarrhea or abdominal pain.  GENITOURINARY: No dysuria, hematuria.  ENDOCRINE: No polyuria, nocturia,  HEMATOLOGY: No anemia, easy bruising or bleeding SKIN: No rash or lesion. MUSCULOSKELETAL: No joint pain or arthritis.   NEUROLOGIC: No tingling, numbness, weakness.  PSYCHIATRY: No anxiety or depression.   MEDICATIONS AT HOME:  Prior to Admission medications   Medication Sig Start Date End Date Taking? Authorizing Provider  budesonide (PULMICORT) 0.5 MG/2ML nebulizer solution Take 2 mLs (0.5 mg total) by nebulization 2 (two) times daily. 12/12/17  Yes Wieting, Richard, MD  clopidogrel (PLAVIX) 75 MG tablet TAKE 1 TABLET BY MOUTH ONCE DAILY 08/09/17  Yes Brayton El Asad A, MD  digoxin (LANOXIN) 0.125 MG tablet Take 0.125 mg by mouth every other day.    Yes [provider]  ferrous sulfate 325 (65 FE) MG EC tablet Take 325 mg by mouth daily.    Yes  [provider]  furosemide (LASIX) 40 MG tablet Take 40 mg by mouth 2 (two) times daily.  08/09/17  Yes [provider]  glucose blood (TRUE METRIX BLOOD GLUCOSE TEST) test strip Dx E11.21, LON 99 months, check blood sugar three times a day 02/08/18  Yes Lada, Janit Bern, MD  HUMULIN 70/30 (70-30) 100 UNIT/ML injection Inject 50 units in the skin in the morning and 27 units in the skin in the evening. 12/26/17  Yes [provider]  insulin aspart (NOVOLOG) 100 UNIT/ML injection lunch time coverage only. For sugar 121-150 3  units, 151-200 4 units; 210-250 6 units; 251-300 9 units; 301- 350 12 units; greater than 351 16 units 12/13/17  Yes Wieting, Richard, MD  Insulin Pen Needle (UNIFINE PENTIPS) 31G X 6 MM MISC USE AS DIRECTED 10/25/17  Yes Brayton El Asad A, MD  ipratropium-albuterol (DUONEB) 0.5-2.5 (3) MG/3ML SOLN Take 3 mLs by nebulization every 4 (four) hours. 12/12/17  Yes Wieting, Richard, MD  levothyroxine (SYNTHROID, LEVOTHROID) 150 MCG tablet TAKE 1 TABLET BY MOUTH ONCE DAILY ON AN EMPTY STOMACH. WAIT 30 MINUTES BEFORE TAKING OTHER MEDS. 01/11/18  Yes Brayton El Asad A, MD  pravastatin (PRAVACHOL) 40 MG tablet TAKE 1 TABLET BY MOUTH ONCE DAILY 01/19/18  Yes Ellyn Hack, MD  predniSONE (DELTASONE) 20 MG tablet Take 1 tablet (20 mg total) by mouth daily with breakfast. 01/15/18  Yes Yevonne Pax, MD  raloxifene (EVISTA) 60 MG tablet TAKE 1 TABLET BY MOUTH ONCE DAILY 01/11/18  Yes Ellyn Hack, MD  ranitidine (ZANTAC) 150 MG tablet Take 150 mg by mouth 2 (two) times daily.   Yes [provider]  telmisartan (MICARDIS) 80 MG tablet Take 1 tablet (80 mg total) by mouth daily. 01/08/18  Yes Ellyn Hack, MD  VENTOLIN HFA 108 (90 BASE) MCG/ACT inhaler Inhale 2 puffs into the lungs every 6 (six) hours as needed.  05/08/15  Yes [provider]  vitamin E 400 UNIT capsule Take 400 Units by mouth daily.   Yes [provider]  acetaminophen (TYLENOL) 325 MG tablet Take 2 tablets (650 mg total) by mouth every 6 (six) hours as needed for mild pain (or Fever >/= 101). Patient not taking: Reported on 02/08/2018 12/12/17   Alford Highland, MD  ALPRAZolam Prudy Feeler) 0.25 MG tablet Take 1 tablet (0.25 mg total) by mouth 2 (two) times daily as needed for anxiety. Patient not taking: Reported on 02/08/2018 12/12/17   Alford Highland, MD  collagenase (SANTYL) ointment Apply topically daily. Patient not taking: Reported on 01/08/2018 12/13/17   Alford Highland, MD  dextromethorphan (DELSYM) 30 MG/5ML liquid Take  2.5 mLs (15 mg total) by mouth 2 (two) times daily as needed for cough. Patient not taking: Reported on 01/08/2018 12/12/17   Alford Highland, MD  diphenhydrAMINE (BENADRYL) 25 mg capsule Take 1 capsule (25 mg total) by mouth 2 (two) times daily as needed for itching or allergies. Patient not taking: Reported on 02/08/2018 03/30/17   Altamese Dilling, MD  ketoconazole (NIZORAL) 2 % cream Apply 1 application 2 (two) times daily as needed topically for irritation. Patient not taking: Reported on 02/08/2018 10/20/17   Doren Custard, FNP  pantoprazole (PROTONIX) 20 MG tablet Take 1 tablet (20 mg total) by mouth daily. Patient not taking: Reported on 02/08/2018 02/01/18   Kerman Passey, MD  polyethylene glycol (MIRALAX / GLYCOLAX) packet Take 17 g by mouth daily. Patient not taking: Reported on 02/08/2018 12/13/17  Alford Highland, MD  Vitamin D, Ergocalciferol, (DRISDOL) 50000 units CAPS capsule Take 1 capsule (50,000 Units total) by mouth every 7 (seven) days. Patient not taking: Reported on 01/08/2018 09/12/17   Ellyn Hack, MD      PHYSICAL EXAMINATION:   VITAL SIGNS: Blood pressure (!) 148/70, pulse 85, temperature 98.5 F (36.9 C), temperature source Oral, resp. rate (!) 27, height 4\' 11"  (1.499 m), weight 78 kg (172 lb), SpO2 92 %.  GENERAL:  77 y.o.-year-old patient lying in the bed with no acute distress.  EYES: Pupils equal, round, reactive to light and accommodation. No scleral icterus. Extraocular muscles intact.  HEENT: Head atraumatic, normocephalic. Oropharynx and nasopharynx clear.  NECK:  Supple, no jugular venous distention. No thyroid enlargement, no tenderness.  LUNGS: Normal breath sounds bilaterally, no wheezing, bilateral crepitation. No use of accessory muscles of respiration. Have use of nasal cannula oxygen. CARDIOVASCULAR: S1, S2 normal. No murmurs, rubs, or gallops.  ABDOMEN: Soft, nontender, nondistended. Bowel sounds present. No organomegaly or mass.  EXTREMITIES:  bilateral pedal edema, no cyanosis, or clubbing.  NEUROLOGIC: Cranial nerves II through XII are intact. Muscle strength 4- 5/5 in all extremities. Sensation intact. Gait not checked.  PSYCHIATRIC: The patient is alert and oriented x 3.  SKIN: No obvious rash, lesion, or ulcer.   LABORATORY PANEL:   CBC Recent Labs  Lab 02/08/18 1842  WBC 11.5*  HGB 11.1*  HCT 34.7*  PLT 216  MCV 88.4  MCH 28.4  MCHC 32.1  RDW 16.4*   ------------------------------------------------------------------------------------------------------------------  Chemistries  Recent Labs  Lab 02/08/18 1842 02/08/18 1843  NA 141  --   K 4.0  --   CL 103  --   CO2 27  --   GLUCOSE 105*  --   BUN 26*  --   CREATININE 0.90  --   CALCIUM 8.5*  --   AST  --  23  ALT  --  37  ALKPHOS  --  56  BILITOT  --  0.8   ------------------------------------------------------------------------------------------------------------------ estimated creatinine clearance is 47.9 mL/min (by C-G formula based on SCr of 0.9 mg/dL). ------------------------------------------------------------------------------------------------------------------ Recent Labs    02/08/18 1842  TSH 0.175*     Coagulation profile No results for input(s): INR, PROTIME in the last 168 hours. ------------------------------------------------------------------------------------------------------------------- No results for input(s): DDIMER in the last 72 hours. -------------------------------------------------------------------------------------------------------------------  Cardiac Enzymes Recent Labs  Lab 02/08/18 1842  TROPONINI 0.29*   ------------------------------------------------------------------------------------------------------------------ Invalid input(s): POCBNP  ---------------------------------------------------------------------------------------------------------------  Urinalysis    Component Value Date/Time    COLORURINE STRAW (A) 02/08/2018 1842   APPEARANCEUR CLEAR (A) 02/08/2018 1842   APPEARANCEUR Clear 10/25/2012 2130   LABSPEC 1.005 02/08/2018 1842   LABSPEC 1.005 10/25/2012 2130   PHURINE 6.0 02/08/2018 1842   GLUCOSEU NEGATIVE 02/08/2018 1842   GLUCOSEU Negative 10/25/2012 2130   HGBUR NEGATIVE 02/08/2018 1842   BILIRUBINUR NEGATIVE 02/08/2018 1842   BILIRUBINUR Negative 10/25/2012 2130   KETONESUR NEGATIVE 02/08/2018 1842   PROTEINUR NEGATIVE 02/08/2018 1842   NITRITE NEGATIVE 02/08/2018 1842   LEUKOCYTESUR NEGATIVE 02/08/2018 1842   LEUKOCYTESUR Trace 10/25/2012 2130     RADIOLOGY: Dg Chest 2 View  Result Date: 02/08/2018 CLINICAL DATA:  SOB with exertion, aches in left arm. Hx of COPD, diabetes, HTN. Nonsmoker. EXAM: CHEST - 2 VIEW COMPARISON:  12/07/2017 FINDINGS: The heart is enlarged. There is mild pulmonary vascular congestion. No overt edema. No focal consolidations or pleural effusions. IMPRESSION: Cardiomegaly and pulmonary vascular congestion. Electronically Signed  By: Norva PavlovElizabeth  Brown M.D.   On: 02/08/2018 19:14    EKG: Orders placed or performed during the hospital encounter of 02/08/18  . EKG 12-Lead  . EKG 12-Lead  . ED EKG  . ED EKG  . Repeat EKG  . Repeat EKG    IMPRESSION AND PLAN:  * Acute on chronic diastolic congestive heart failure   IV Lasix, intake and output monitoring, fluid restriction, daily weight, echocardiogram last one is more than a year ago so I will repeat echo today.   Currently does not need to call cardiologist, if we need [facility] and [facility].  * chronic COPD with chronic respiratory failure and oxygen use at home   Continue supplemental oxygen and keep on nebulizer therapy.   No wheezing currently, continue her baseline steroid 20 mg daily.  * diabetes   we'll keep on insulin 7030 but decreased dose and keep on Sliding scale coverage.  * paroxysmal atrial fibrillation   Continue digoxin.    All the records are  reviewed and case discussed with ED provider. Management plans discussed with the patient, family and they are in agreement.  CODE STATUS:DO NOT RESUSCITATE Code Status History    Date Active Date Inactive Code Status Order ID Comments User Context   12/07/2017 14:59 12/13/2017 20:25 DNR [id number]  [place] SirenSainani, Vivek J, MD Inpatient   12/07/2017 14:46 12/07/2017 14:59 Full Code [id number]  [place] SirenSainani, Vivek J, MD Inpatient   03/28/2017 15:09 03/30/2017 21:26 DNR [id number]  Adrian SaranMody, Sital, MD Inpatient    Questions for Most Recent Historical Code Status (Order [id number])    Question Answer Comment   In the event of cardiac or respiratory ARREST Do not call a "code blue"    In the event of cardiac or respiratory ARREST Do not perform Intubation, CPR, defibrillation or ACLS    In the event of cardiac or respiratory ARREST Use medication by any route, position, wound care, and other measures to relive pain and suffering. May use oxygen, suction and manual treatment of airway obstruction as needed for comfort.      Discussed with her son in the room.  TOTAL TIME TAKING CARE OF THIS PATIENT: 50 minutes.    Altamese DillingVaibhavkumar Peaches Vanoverbeke M.D on 02/08/2018   Between 7am to 6pm - Pager - [phone]  After 6pm go to [email or web address] - Social research officer, governmentpassword [email or web address] [email or web address]  [facility] [place] [facility]  Office  [phone]  CC: Primary care physician; Ellyn HackShah, Syed Asad A, MD   Note: This dictation was prepared with Dragon dictation along with smaller phrase technology. Any transcriptional errors that result from this process are unintentional.

## 2018-02-09 ENCOUNTER — Inpatient Hospital Stay
Admit: 2018-02-09 | Discharge: 2018-02-09 | Disposition: A | Discharge status: Home / Self Care | Payer: Medicare Other | Attending: Internal Medicine | Admitting: Internal Medicine

## 2018-02-09 DIAGNOSIS — I110 Hypertensive heart disease with heart failure: Secondary | ICD-10-CM | POA: Diagnosis not present

## 2018-02-09 DIAGNOSIS — J810 Acute pulmonary edema: Secondary | ICD-10-CM | POA: Diagnosis not present

## 2018-02-09 LAB — CBC
HCT: 32.7 % — ABNORMAL LOW (ref 35.0–47.0)
HEMOGLOBIN: 10.4 g/dL — AB (ref 12.0–16.0)
MCH: 28.2 pg (ref 26.0–34.0)
MCHC: 31.8 g/dL — ABNORMAL LOW (ref 32.0–36.0)
MCV: 88.8 fL (ref 80.0–100.0)
PLATELETS: 216 10*3/uL (ref 150–440)
RBC: 3.68 MIL/uL — AB (ref 3.80–5.20)
RDW: 16.5 % — ABNORMAL HIGH (ref 11.5–14.5)
WBC: 10.2 10*3/uL (ref 3.6–11.0)

## 2018-02-09 LAB — GLUCOSE, CAPILLARY
GLUCOSE-CAPILLARY: 42 mg/dL — AB (ref 65–99)
GLUCOSE-CAPILLARY: 205 mg/dL — AB (ref 65–99)
GLUCOSE-CAPILLARY: 265 mg/dL — AB (ref 65–99)
GLUCOSE-CAPILLARY: 325 mg/dL — AB (ref 65–99)
Glucose-Capillary: 313 mg/dL — ABNORMAL HIGH (ref 65–99)

## 2018-02-09 LAB — BASIC METABOLIC PANEL
ANION GAP: 9 (ref 5–15)
BUN: 28 mg/dL — ABNORMAL HIGH (ref 6–20)
CALCIUM: 8.3 mg/dL — AB (ref 8.9–10.3)
CO2: 33 mmol/L — AB (ref 22–32)
Chloride: 105 mmol/L (ref 101–111)
Creatinine, Ser: 0.98 mg/dL (ref 0.44–1.00)
GFR, EST NON AFRICAN AMERICAN: 55 mL/min — AB (ref >60)
Glucose, Bld: 48 mg/dL — ABNORMAL LOW (ref 65–99)
Potassium: 3.6 mmol/L (ref 3.5–5.1)
SODIUM: 147 mmol/L — AB (ref 135–145)

## 2018-02-09 LAB — ECHOCARDIOGRAM COMPLETE
HEIGHTINCHES: 59 in
WEIGHTICAEL: 2835.2 [oz_av]

## 2018-02-09 MED ORDER — IPRATROPIUM-ALBUTEROL 0.5-2.5 (3) MG/3ML IN SOLN
3.0000 mL | Freq: Four times a day (QID) | RESPIRATORY_TRACT | Status: DC
  Administered 2018-02-09 – 2018-02-10 (×3): 3 mL via RESPIRATORY_TRACT
  Filled 2018-02-09 (×3): qty 3

## 2018-02-09 MED ORDER — INSULIN ASPART 100 UNIT/ML ~~LOC~~ SOLN
0.0000 [IU] | Freq: Three times a day (TID) | SUBCUTANEOUS | Status: DC
  Administered 2018-02-09: 7 [IU] via SUBCUTANEOUS
  Administered 2018-02-10: 1 [IU] via SUBCUTANEOUS
  Filled 2018-02-09 (×2): qty 1

## 2018-02-09 MED ORDER — FUROSEMIDE 10 MG/ML IJ SOLN
40.0000 mg | Freq: Two times a day (BID) | INTRAMUSCULAR | Status: DC
  Administered 2018-02-10: 40 mg via INTRAVENOUS
  Filled 2018-02-09: qty 4

## 2018-02-09 NOTE — Plan of Care (Signed)
  Clinical Measurements: Ability to maintain clinical measurements within normal limits will improve 02/09/2018 1420 - Not Progressing by Raynald BlendImhoff, Bernedette Auston M, RN Note Sodium is elevated today at 147. Will continue to monitor lab values. Jari FavreSteven M [facility] Cent[facility]

## 2018-02-09 NOTE — Progress Notes (Signed)
[employer] - [place] at [facility]   PATIENT NAME: Karen [name]    MR#:  [id number]  DATE OF BIRTH:  08-10-1941  SUBJECTIVE:  CHIEF COMPLAINT:   Chief Complaint  Patient presents with  . Near Syncope  wants to update her meds (says she takes 40 MG lasix BID) and another ARB (we don't have that as formulary so we'll keep Avapro) and she's ok REVIEW OF SYSTEMS:  Review of Systems  Constitutional: Positive for malaise/fatigue. Negative for chills, fever and weight loss.  HENT: Negative for nosebleeds and sore throat.   Eyes: Negative for blurred vision.  Respiratory: Positive for shortness of breath. Negative for cough and wheezing.   Cardiovascular: Negative for chest pain, orthopnea, leg swelling and PND.  Gastrointestinal: Negative for abdominal pain, constipation, diarrhea, heartburn, nausea and vomiting.  Genitourinary: Negative for dysuria and urgency.  Musculoskeletal: Negative for back pain.  Skin: Negative for rash.  Neurological: Positive for weakness. Negative for dizziness, speech change, focal weakness and headaches.  Endo/Heme/Allergies: Does not bruise/bleed easily.  Psychiatric/Behavioral: Negative for depression.    DRUG ALLERGIES:   Allergies  Allergen Reactions  . Ace Inhibitors Other (See Comments)    Cough   . Singulair [Montelukast Sodium]    VITALS:  Blood pressure (!) 124/57, pulse 93, temperature 98 F (36.7 C), temperature source Oral, resp. rate 18, height 4\' 11"  (1.499 m), weight 80.4 kg (177 lb 3.2 oz), SpO2 100 %. PHYSICAL EXAMINATION:  Physical Exam  Constitutional: She is oriented to person, place, and time and well-developed, well-nourished, and in no distress.  HENT:  Head: Normocephalic and atraumatic.  Eyes: Conjunctivae and EOM are normal. Pupils are equal, round, and reactive to light.  Neck: Normal range of motion. Neck supple. No tracheal deviation present. No thyromegaly present.  Cardiovascular: Normal rate,  regular rhythm and normal heart sounds.  Pulmonary/Chest: Effort normal and breath sounds normal. No respiratory distress. She has no wheezes. She exhibits no tenderness.  Abdominal: Soft. Bowel sounds are normal. She exhibits no distension. There is no tenderness.  Musculoskeletal: Normal range of motion.  Neurological: She is alert and oriented to person, place, and time. No cranial nerve deficit.  Skin: Skin is warm and dry. No rash noted.  Psychiatric: Mood and affect normal.   LABORATORY PANEL:  Female CBC Recent Labs  Lab 02/09/18 0428  WBC 10.2  HGB 10.4*  HCT 32.7*  PLT 216   ------------------------------------------------------------------------------------------------------------------ Chemistries  Recent Labs  Lab 02/08/18 1843 02/09/18 0428  NA  --  147*  K  --  3.6  CL  --  105  CO2  --  33*  GLUCOSE  --  48*  BUN  --  28*  CREATININE  --  0.98  CALCIUM  --  8.3*  AST 23  --   ALT 37  --   ALKPHOS 56  --   BILITOT 0.8  --    RADIOLOGY:  Dg Chest 2 View  Result Date: 02/08/2018 CLINICAL DATA:  SOB with exertion, aches in left arm. Hx of COPD, diabetes, HTN. Nonsmoker. EXAM: CHEST - 2 VIEW COMPARISON:  12/07/2017 FINDINGS: The heart is enlarged. There is mild pulmonary vascular congestion. No overt edema. No focal consolidations or pleural effusions. IMPRESSION: Cardiomegaly and pulmonary vascular congestion. Electronically Signed   By: Norva PavlovElizabeth  Brown M.D.   On: 02/08/2018 19:14   ASSESSMENT AND PLAN:   * Acute on chronic diastolic congestive heart failure - continue Avapro - continue  IV Lasix, intake and output monitoring, fluid restriction, daily weight, echocardiogram shows EF 50-55%  * chronic COPD with chronic respiratory failure and oxygen use at home   Continue supplemental oxygen and keep on nebulizer therapy.   No wheezing currently, continue her baseline steroid 20 mg daily.  * diabetes   - insulin 7030 but decreased dose and keep on  Sliding scale coverage.  * paroxysmal atrial fibrillation   Continue digoxin,  - consider anticoagulation (cardio recommends outpt eval for that)  * Hypothyroidism - continue synthroid, check TSH    All the records are reviewed and case discussed with Care Management/Social Worker. Management plans discussed with the patient, nursing and they are in agreement.  CODE STATUS: DNR  TOTAL TIME TAKING CARE OF THIS PATIENT: 35 minutes.   More than 50% of the time was spent in counseling/coordination of care: YES  POSSIBLE D/C IN 2-3 DAYS, DEPENDING ON CLINICAL CONDITION.   Delfino Lovett M.D on 02/09/2018 at 6:36 PM  Between 7am to 6pm - Pager - [phone]  After 6pm go to [email or web address] - Scientist, research (life sciences) [place] [facility]  Office  [phone]  CC: Primary care physician; Ellyn Hack, MD  Note: This dictation was prepared with Dragon dictation along with smaller phrase technology. Any transcriptional errors that result from this process are unintentional.

## 2018-02-09 NOTE — Care Management Important Message (Signed)
Important Message  Patient Details  Name: Karen [name] M [place] MRN: [id number] Date of Birth: Aug 23, 1941   Medicare Important Message Given:  Yes Signed IM notice given    Eber HongGreene, Ashton Sabine R, RN 02/09/2018, 3:10 PM

## 2018-02-09 NOTE — Care Management (Addendum)
Patient had a recent discharge from [facility] towards the end of February after [facility].  Admitted with CHF and exac copd.  Has chronic oxygen, scales and home nebulizer.  Says she "thinks" she has home health services but not sure of name of agency. Reached out to [facility] as they have followed patient in the past and agency is providing RN PT OT, SW.  Per CV nurse navigator, patient is reluctant accept [facility] at [facility] as she does not want to ask her son to get off  work any more than she has too.  [facility].  Will have [facility] investigate whether that person could transport to appointments.  Also asked that the social worker address issues with transportation that may hamper to getting to appointments if son is not available.  [facility] Also has telehealth and can provide patient with scales if it is found that patient's scales are not working properly.

## 2018-02-09 NOTE — Consult Note (Signed)
[facility] Cardiology  CARDIOLOGY CONSULT NOTE  Patient ID: Karen [name] MRN: [id number] DOB/AGE: 77-Apr-1942 77 y.o.  Admit date: 02/08/2018 Referring Physician Sherryll Burger  Primary Physician [facility] Primary Cardiologist [facility] Reason for Consultation congestive heart failure  HPI: 77 year old female referred for admission congestive heart failure. The patient has known history of O2 dependent COPD and chronic diastolic congestive heart failure. She was recently hospitalized for similar episode respiratory failure, multifactorial, secondary to COPD and CHF. She presents with shoulder history of worsening shortness of breath, peripheral edema, orthopnea emergency room, the patient was noted have worsening CHF, treated with intravenous furosemide with diuresis and overall clinical improvement. ECG reveals atrial fibrillation at a rate of 96 BPM.  Review of systems complete and found to be negative unless listed above     Past Medical History:  Diagnosis Date  . COPD (chronic obstructive pulmonary disease) (HCC)   . Diabetes mellitus without complication (HCC)   . Hyperlipidemia   . Hypertension     Past Surgical History:  Procedure Laterality Date  . CHOLECYSTECTOMY  1993    Medications Prior to Admission  Medication Sig Dispense Refill Last Dose  . budesonide (PULMICORT) 0.5 MG/2ML nebulizer solution Take 2 mLs (0.5 mg total) by nebulization 2 (two) times daily. 120 mL 0 02/08/2018 at AM  . clopidogrel (PLAVIX) 75 MG tablet TAKE 1 TABLET BY MOUTH ONCE DAILY 90 tablet 3 02/07/2018 at PM  . digoxin (LANOXIN) 0.125 MG tablet Take 0.125 mg by mouth every other day.    02/08/2018 at AM  . ferrous sulfate 325 (65 FE) MG EC tablet Take 325 mg by mouth daily.    N/A at N/A  . furosemide (LASIX) 40 MG tablet Take 40 mg by mouth 2 (two) times daily.    02/08/2018 at AM  . glucose blood (TRUE METRIX BLOOD GLUCOSE TEST) test strip Dx E11.21, LON 99 months, check blood sugar three times a day 300 each 5 UTD at UTD  .  HUMULIN 70/30 (70-30) 100 UNIT/ML injection Inject 50 units in the skin in the morning and 27 units in the skin in the evening.   02/08/2018 at AM  . insulin aspart (NOVOLOG) 100 UNIT/ML injection lunch time coverage only. For sugar 121-150 3 units, 151-200 4 units; 210-250 6 units; 251-300 9 units; 301- 350 12 units; greater than 351 16 units 10 mL 0 PRN at PRN  . Insulin Pen Needle (UNIFINE PENTIPS) 31G X 6 MM MISC USE AS DIRECTED 100 each 1 UTD at UTD  . ipratropium-albuterol (DUONEB) 0.5-2.5 (3) MG/3ML SOLN Take 3 mLs by nebulization every 4 (four) hours. 360 mL 0 PRN at PRN  . levothyroxine (SYNTHROID, LEVOTHROID) 150 MCG tablet TAKE 1 TABLET BY MOUTH ONCE DAILY ON AN EMPTY STOMACH. WAIT 30 MINUTES BEFORE TAKING OTHER MEDS. 90 tablet 0 02/08/2018 at AM  . pravastatin (PRAVACHOL) 40 MG tablet TAKE 1 TABLET BY MOUTH ONCE DAILY 90 tablet 1 02/07/2018 at PM  . predniSONE (DELTASONE) 20 MG tablet Take 1 tablet (20 mg total) by mouth daily with breakfast. 30 tablet 1 02/08/2018 at AM  . raloxifene (EVISTA) 60 MG tablet TAKE 1 TABLET BY MOUTH ONCE DAILY 30 tablet 2 02/08/2018 at AM  . ranitidine (ZANTAC) 150 MG tablet Take 150 mg by mouth 2 (two) times daily.   02/08/2018 at AM  . telmisartan (MICARDIS) 80 MG tablet Take 1 tablet (80 mg total) by mouth daily. 90 tablet 0 02/08/2018 at AM  . VENTOLIN HFA 108 (90 BASE)  MCG/ACT inhaler Inhale 2 puffs into the lungs every 6 (six) hours as needed.    PRN at PRN  . vitamin E 400 UNIT capsule Take 400 Units by mouth daily.   02/08/2018 at AM  . acetaminophen (TYLENOL) 325 MG tablet Take 2 tablets (650 mg total) by mouth every 6 (six) hours as needed for mild pain (or Fever >/= 101). (Patient not taking: Reported on 02/08/2018)   -- at --  . ALPRAZolam (XANAX) 0.25 MG tablet Take 1 tablet (0.25 mg total) by mouth 2 (two) times daily as needed for anxiety. (Patient not taking: Reported on 02/08/2018) 20 tablet 0 -- at --  . collagenase (SANTYL) ointment Apply topically daily.  (Patient not taking: Reported on 01/08/2018) 15 g 0 -- at --  . dextromethorphan (DELSYM) 30 MG/5ML liquid Take 2.5 mLs (15 mg total) by mouth 2 (two) times daily as needed for cough. (Patient not taking: Reported on 01/08/2018) 89 mL 0 -- at --  . diphenhydrAMINE (BENADRYL) 25 mg capsule Take 1 capsule (25 mg total) by mouth 2 (two) times daily as needed for itching or allergies. (Patient not taking: Reported on 02/08/2018) 10 capsule 0 -- at --  . ketoconazole (NIZORAL) 2 % cream Apply 1 application 2 (two) times daily as needed topically for irritation. (Patient not taking: Reported on 02/08/2018) 30 g 0 -- at --  . pantoprazole (PROTONIX) 20 MG tablet Take 1 tablet (20 mg total) by mouth daily. (Patient not taking: Reported on 02/08/2018) 30 tablet 2 -- at --  . polyethylene glycol (MIRALAX / GLYCOLAX) packet Take 17 g by mouth daily. (Patient not taking: Reported on 02/08/2018) 30 each 0 -- at --  . Vitamin D, Ergocalciferol, (DRISDOL) 50000 units CAPS capsule Take 1 capsule (50,000 Units total) by mouth every 7 (seven) days. (Patient not taking: Reported on 01/08/2018) 12 capsule 0 -- at --   Social History   Socioeconomic History  . Marital status: Single    Spouse name: Not on file  . Number of children: Not on file  . Years of education: Not on file  . Highest education level: Not on file  Social Needs  . Financial resource strain: Not on file  . Food insecurity - worry: Not on file  . Food insecurity - inability: Not on file  . Transportation needs - medical: Not on file  . Transportation needs - non-medical: Not on file  Occupational History  . Not on file  Tobacco Use  . Smoking status: Never Smoker  . Smokeless tobacco: Never Used  Substance and Sexual Activity  . Alcohol use: No    Alcohol/week: 0.0 oz  . Drug use: No  . Sexual activity: No  Other Topics Concern  . Not on file  Social History Narrative  . Not on file    Family History  Problem Relation Age of Onset  . Heart  disease Mother   . Heart disease Father   . Heart disease Sister   . Dementia Sister   . Heart disease Brother   . Diabetes Paternal Grandmother       Review of systems complete and found to be negative unless listed above      PHYSICAL EXAM  General: Well developed, well nourished, in no acute distress HEENT:  Normocephalic and atramatic Neck:  No JVD.  Lungs: Clear bilaterally to auscultation and percussion. Heart: HRRR . Normal S1 and S2 without gallops or murmurs.  Abdomen: Bowel sounds are positive, abdomen soft  and non-tender  Msk:  Back normal, normal gait. Normal strength and tone for age. Extremities: No clubbing, cyanosis or edema.   Neuro: Alert and oriented X 3. Psych:  Good affect, responds appropriately  Labs:   Lab Results  Component Value Date   WBC 10.2 02/09/2018   HGB 10.4 (L) 02/09/2018   HCT 32.7 (L) 02/09/2018   MCV 88.8 02/09/2018   PLT 216 02/09/2018    Recent Labs  Lab 02/08/18 1843 02/09/18 0428  NA  --  147*  K  --  3.6  CL  --  105  CO2  --  33*  BUN  --  28*  CREATININE  --  0.98  CALCIUM  --  8.3*  PROT 6.7  --   BILITOT 0.8  --   ALKPHOS 56  --   ALT 37  --   AST 23  --   GLUCOSE  --  48*   Lab Results  Component Value Date   CKTOTAL 148 12/11/2017   TROPONINI 0.29 (HH) 02/08/2018    Lab Results  Component Value Date   CHOL 161 05/09/2017   CHOL 176 09/09/2016   CHOL 214 (H) 10/22/2015   Lab Results  Component Value Date   HDL 79 05/09/2017   HDL 72 09/09/2016   HDL 73 10/22/2015   Lab Results  Component Value Date   LDLCALC 63 05/09/2017   LDLCALC 85 09/09/2016   LDLCALC 122 (H) 10/22/2015   Lab Results  Component Value Date   TRIG 94 05/09/2017   TRIG 94 09/09/2016   TRIG 93 10/22/2015   Lab Results  Component Value Date   CHOLHDL 2.0 05/09/2017   CHOLHDL 2.4 09/09/2016   CHOLHDL 2.9 10/22/2015   No results found for: LDLDIRECT    Radiology: Dg Chest 2 View  Result Date: 02/08/2018 CLINICAL  DATA:  SOB with exertion, aches in left arm. Hx of COPD, diabetes, HTN. Nonsmoker. EXAM: CHEST - 2 VIEW COMPARISON:  12/07/2017 FINDINGS: The heart is enlarged. There is mild pulmonary vascular congestion. No overt edema. No focal consolidations or pleural effusions. IMPRESSION: Cardiomegaly and pulmonary vascular congestion. Electronically Signed   By: Norva Pavlov M.D.   On: 02/08/2018 19:14    EKG: Atrial fibrillation  ASSESSMENT AND PLAN:   1. Acute on chronic diastolic congestive heart failure, most improvement after initial diuresis 2. Chronic COPD on supplemental O2 3. Paroxysmal atrial fibrillation, rate controlled on digoxin  Recommendations  1. Agree with current therapy 2. Continue diuresis 3. Carefully monitor renal status 4. Continue Plavix for now 5. Consider changing from Plavix to chronic anticoagulation for stroke prevention ( which can be done as outpatient, and follow up with Dr. Juliann Pares )   Signed: Marcina Millard MD,PhD, [facility] 02/09/2018, 4:17 PM

## 2018-02-09 NOTE — Progress Notes (Signed)
77 year old female with hx of COPD on chronic home oxygen, DM, HLD, HTN, and CHF.  Patient admitted with acute on chronic diastolic CHF, chronic COPD with respiratory failure and oxygen use at home, DM, and paroxysmal a-fib.    CHF Education completed.   Patient lying in bed with HOB elevated.  Patient is on 2 liters via [place].    Provided patient with "Living Better with Heart Failure" packet. Briefly reviewed definition of heart failure and signs and symptoms of an exacerbation.?Explained to patient that HF is a chronic illness which requires self-assessment / self-management along with help from the cardiologist/PCP/HF Clinic.???  *Reviewed importance of and reason behind checking weight daily in the AM, after using the bathroom, but before getting dressed.  Patient has not been weighing herself at home due to her scales being broken.  Discussed with RNCM.  ? ? Reviewed the following information with patient:  *Discussed when to call the Dr= weight gain of >2-3lb overnight of 5lb in a week,  *Discussed yellow zone= call MD: weight gain of >2-3lb overnight of 5lb in a week, increased swelling, increased SOB when lying down, chest discomfort, dizziness, increased fatigue *Red Zone= call 911: struggle to breath, fainting or near fainting, significant chest pain   *Reviewed low sodium diet-provided handout of recommended and not recommended foods. ?Reviewed reading labels with patient. Discussed fluid intake with patient as well. Patient not currently on a fluid restriction, but advised no more than 8-8 ounces glass of fluids per day.?Patient gets her meals from [employer].    *Instructed patient to take medications as prescribed for heart failure. Explained briefly why pt is on the medications (either make you feel better, live longer or keep you out of the hospital) and discussed monitoring and side effects.   *Smoking Cessation?-  Patient is a NEVER smoker.   ? *Exercise / Activity - Patient  encouraged to remain as active as possible.?  *[facility]- Explained the [facility] of [facility]. ?Explained to patient that the HF Clinic does not replace her PCP/cardiologist, but is an additional resource to help her manage her HF and to keep her out of the hospital.  Appointment in the HF Clinic is scheduled for 02/20/2018 at 11:00 a.m.  Patient encouraged to keep this appointment. Patient stated, "I'm not going to be able to go to the HF Clinic because I do not drive.  I try to limit the number of times my son has to get off work to take me to my appointments."  Patient requested this RN cancel the appointment in the [facility].  Discussed with RNCM who [facility].  RNCM will talk to patient about this appointment and transportation.    Again, the 5 Steps to Living Better with Heart Failure were discussed with patient   Army Meliaiane Wright, RN, BSN, [facility] Cardiovascular and Pulmonary Nurse Navigator

## 2018-02-09 NOTE — Progress Notes (Signed)
*  PRELIMINARY RESULTS* Echocardiogram 2D Echocardiogram has been performed.  Karen [name] 02/09/2018, 10:54 AM

## 2018-02-10 DIAGNOSIS — I110 Hypertensive heart disease with heart failure: Secondary | ICD-10-CM | POA: Diagnosis not present

## 2018-02-10 DIAGNOSIS — J810 Acute pulmonary edema: Secondary | ICD-10-CM | POA: Diagnosis not present

## 2018-02-10 LAB — CBC
HCT: 32.2 % — ABNORMAL LOW (ref 35.0–47.0)
HEMOGLOBIN: 10.1 g/dL — AB (ref 12.0–16.0)
MCH: 27.8 pg (ref 26.0–34.0)
MCHC: 31.4 g/dL — AB (ref 32.0–36.0)
MCV: 88.3 fL (ref 80.0–100.0)
Platelets: 203 10*3/uL (ref 150–440)
RBC: 3.64 MIL/uL — ABNORMAL LOW (ref 3.80–5.20)
RDW: 16.5 % — ABNORMAL HIGH (ref 11.5–14.5)
WBC: 8.4 10*3/uL (ref 3.6–11.0)

## 2018-02-10 LAB — BASIC METABOLIC PANEL
Anion gap: 8 (ref 5–15)
BUN: 26 mg/dL — AB (ref 6–20)
CALCIUM: 7.9 mg/dL — AB (ref 8.9–10.3)
CHLORIDE: 108 mmol/L (ref 101–111)
CO2: 30 mmol/L (ref 22–32)
CREATININE: 0.99 mg/dL (ref 0.44–1.00)
GFR calc non Af Amer: 54 mL/min — ABNORMAL LOW (ref >60)
Glucose, Bld: 127 mg/dL — ABNORMAL HIGH (ref 65–99)
Potassium: 3.4 mmol/L — ABNORMAL LOW (ref 3.5–5.1)
SODIUM: 146 mmol/L — AB (ref 135–145)

## 2018-02-10 LAB — GLUCOSE, CAPILLARY
GLUCOSE-CAPILLARY: 132 mg/dL — AB (ref 65–99)
GLUCOSE-CAPILLARY: 216 mg/dL — AB (ref 65–99)

## 2018-02-10 MED ORDER — INSULIN ASPART PROT & ASPART (70-30 MIX) 100 UNIT/ML ~~LOC~~ SUSP
15.0000 [IU] | Freq: Two times a day (BID) | SUBCUTANEOUS | Status: DC
  Administered 2018-02-10: 15 [IU] via SUBCUTANEOUS
  Filled 2018-02-10: qty 1

## 2018-02-10 MED ORDER — HUMULIN 70/30 (70-30) 100 UNIT/ML ~~LOC~~ SUSP: 15.0000 [IU] | Freq: Two times a day (BID) | SUBCUTANEOUS | 11 refills | Status: DC

## 2018-02-10 NOTE — Discharge Summary (Signed)
[facility] - [place] at [facility]   PATIENT NAME: Karen [name]    MR#:  [id number]  DATE OF BIRTH:  Oct 08, 1941  DATE OF ADMISSION:  02/08/2018 ADMITTING PHYSICIAN: Altamese Dilling, MD  DATE OF DISCHARGE: 02/10/2018   PRIMARY CARE PHYSICIAN: Ellyn Hack, MD    ADMISSION DIAGNOSIS:  Acute pulmonary edema (HCC) [J81.0] Elevated troponin [R74.8] Near syncope [R55]  DISCHARGE DIAGNOSIS:  Principal Problem:   CHF exacerbation (HCC)   SECONDARY DIAGNOSIS:   Past Medical History:  Diagnosis Date  . COPD (chronic obstructive pulmonary disease) (HCC)   . Diabetes mellitus without complication (HCC)   . Hyperlipidemia   . Hypertension     HOSPITAL COURSE:   * Acute on chronic diastolic congestive heart failure - continue Avapro - continueIV Lasix, intake and output monitoring, fluid restriction, daily weight, echocardiogram shows EF 50-55% - Had up to 1.5 ltre diuresis and felt better, able to ambulate in room, back to baseline.  * chronic COPD with chronic respiratory failure and oxygen use at home Continue supplemental oxygen and keep on nebulizer therapy. No wheezing currently, continue her baseline steroid 20 mg daily.  * diabetes - insulin 7030 but decreased dose and keep on Sliding scale coverage.  * paroxysmal atrial fibrillation Continue digoxin,  - consider anticoagulation (cardio recommends outpt eval for that)  * Hypothyroidism - continue synthroid, check TSH    DISCHARGE CONDITIONS:   Stable.  CONSULTS OBTAINED:  Treatment Team:  Marcina Millard, MD  DRUG ALLERGIES:   Allergies  Allergen Reactions  . Ace Inhibitors Other (See Comments)    Cough   . Singulair [Montelukast Sodium]     DISCHARGE MEDICATIONS:   Allergies as of 02/10/2018      Reactions   Ace Inhibitors Other (See Comments)   Cough   Singulair [montelukast Sodium]       Medication List    STOP taking these medications    collagenase ointment Commonly known as:  SANTYL   dextromethorphan 30 MG/5ML liquid Commonly known as:  DELSYM   diphenhydrAMINE 25 mg capsule Commonly known as:  BENADRYL     TAKE these medications   acetaminophen 325 MG tablet Commonly known as:  TYLENOL Take 2 tablets (650 mg total) by mouth every 6 (six) hours as needed for mild pain (or Fever >/= 101).   ALPRAZolam 0.25 MG tablet Commonly known as:  XANAX Take 1 tablet (0.25 mg total) by mouth 2 (two) times daily as needed for anxiety.   budesonide 0.5 MG/2ML nebulizer solution Commonly known as:  PULMICORT Take 2 mLs (0.5 mg total) by nebulization 2 (two) times daily.   clopidogrel 75 MG tablet Commonly known as:  PLAVIX TAKE 1 TABLET BY MOUTH ONCE DAILY   digoxin 0.125 MG tablet Commonly known as:  LANOXIN Take 0.125 mg by mouth every other day.   ferrous sulfate 325 (65 FE) MG EC tablet Take 325 mg by mouth daily.   furosemide 40 MG tablet Commonly known as:  LASIX Take 40 mg by mouth 2 (two) times daily.   glucose blood test strip Commonly known as:  TRUE METRIX BLOOD GLUCOSE TEST Dx E11.21, LON 99 months, check blood sugar three times a day   HUMULIN 70/30 (70-30) 100 UNIT/ML injection Generic drug:  insulin NPH-regular Human Inject 15 Units into the skin 2 (two) times daily with a meal. Inject 50 units in the skin in the morning and 27 units in the skin in the evening. What  changed:    how much to take  how to take this  when to take this   insulin aspart 100 UNIT/ML injection Commonly known as:  novoLOG lunch time coverage only. For sugar 121-150 3 units, 151-200 4 units; 210-250 6 units; 251-300 9 units; 301- 350 12 units; greater than 351 16 units   Insulin Pen Needle 31G X 6 MM Misc Commonly known as:  UNIFINE PENTIPS USE AS DIRECTED   ipratropium-albuterol 0.5-2.5 (3) MG/3ML Soln Commonly known as:  DUONEB Take 3 mLs by nebulization every 4 (four) hours.   ketoconazole 2 %  cream Commonly known as:  NIZORAL Apply 1 application 2 (two) times daily as needed topically for irritation.   levothyroxine 150 MCG tablet Commonly known as:  SYNTHROID, LEVOTHROID TAKE 1 TABLET BY MOUTH ONCE DAILY ON AN EMPTY STOMACH. WAIT 30 MINUTES BEFORE TAKING OTHER MEDS.   pantoprazole 20 MG tablet Commonly known as:  PROTONIX Take 1 tablet (20 mg total) by mouth daily.   polyethylene glycol packet Commonly known as:  MIRALAX / GLYCOLAX Take 17 g by mouth daily.   pravastatin 40 MG tablet Commonly known as:  PRAVACHOL TAKE 1 TABLET BY MOUTH ONCE DAILY   predniSONE 20 MG tablet Commonly known as:  DELTASONE Take 1 tablet (20 mg total) by mouth daily with breakfast.   raloxifene 60 MG tablet Commonly known as:  EVISTA TAKE 1 TABLET BY MOUTH ONCE DAILY   ranitidine 150 MG tablet Commonly known as:  ZANTAC Take 150 mg by mouth 2 (two) times daily.   telmisartan 80 MG tablet Commonly known as:  MICARDIS Take 1 tablet (80 mg total) by mouth daily.   VENTOLIN HFA 108 (90 Base) MCG/ACT inhaler Generic drug:  albuterol Inhale 2 puffs into the lungs every 6 (six) hours as needed.   Vitamin D (Ergocalciferol) 50000 units Caps capsule Commonly known as:  DRISDOL Take 1 capsule (50,000 Units total) by mouth every 7 (seven) days.   vitamin E 400 UNIT capsule Take 400 Units by mouth daily.        DISCHARGE INSTRUCTIONS:    Follow with Cardio clinic and PMD in 1-2 weeks.  If you experience worsening of your admission symptoms, develop shortness of breath, life threatening emergency, suicidal or homicidal thoughts you must seek medical attention immediately by calling 911 or calling your MD immediately  if symptoms less severe.  You Must read complete instructions/literature along with all the possible adverse reactions/side effects for all the Medicines you take and that have been prescribed to you. Take any new Medicines after you have completely understood and  accept all the possible adverse reactions/side effects.   Please note  You were cared for by a hospitalist during your hospital stay. If you have any questions about your discharge medications or the care you received while you were in the hospital after you are discharged, you can call the unit and asked to speak with the hospitalist on call if the hospitalist that took care of you is not available. Once you are discharged, your primary care physician will handle any further medical issues. Please note that NO REFILLS for any discharge medications will be authorized once you are discharged, as it is imperative that you return to your primary care physician (or establish a relationship with a primary care physician if you do not have one) for your aftercare needs so that they can reassess your need for medications and monitor your lab values.    Today  CHIEF COMPLAINT:   Chief Complaint  Patient presents with  . Near Syncope    HISTORY OF PRESENT ILLNESS:  Karen [name]  is a 77 y.o. female with a known history of COPD on chronic oxygen at home, diabetes, hyperlipidemia, hypertension- was admitted in hospital with COPD exacerbation, she was sent to rehabilitation with oxygen and long tapering steroids. She felt better with higher dose of steroid but after tapering she started feeling some short of breath so went to see her pulmonary doctor, Dr.Khan- he had increased her baseline steroids from 10 mg to 20 mg daily. She also takes 80 mg Lasix oral daily but drinks a lot of water at home almost 5-6, 16 ounce bottles every day. Last few days she has worsening shortness of breath and symptoms of orthopnea and exertional dyspnea with minimal walking only a few steps. She denies any cough, fever or chills, chest pain. In ER she is noted to have worsening of CHF and given to hospitalist team for further management.   VITAL SIGNS:  Blood pressure (!) 123/99, pulse 95, temperature 97.7 F (36.5 C),  temperature source Oral, resp. rate 18, height 4\' 11"  (1.499 m), weight 79.9 kg (176 lb 1.6 oz), SpO2 98 %.  I/O:    Intake/Output Summary (Last 24 hours) at 02/10/2018 1050 Last data filed at 02/10/2018 0448 Gross per 24 hour  Intake -  Output 1100 ml  Net -1100 ml    PHYSICAL EXAMINATION:   Constitutional: She is oriented to person, place, and time and well-developed, well-nourished, and in no distress.  HENT:  Head: Normocephalic and atraumatic.  Eyes: Conjunctivae and EOM are normal. Pupils are equal, round, and reactive to light.  Neck: Normal range of motion. Neck supple. No tracheal deviation present. No thyromegaly present.  Cardiovascular: Normal rate, regular rhythm and normal heart sounds.  Pulmonary/Chest: Effort normal and breath sounds normal. No respiratory distress. She has no wheezes. She exhibits no tenderness.  Abdominal: Soft. Bowel sounds are normal. She exhibits no distension. There is no tenderness.  Musculoskeletal: Normal range of motion.  Neurological: She is alert and oriented to person, place, and time. No cranial nerve deficit.  Skin: Skin is warm and dry. No rash noted.  Psychiatric: Mood and affect normal.    DATA REVIEW:   CBC Recent Labs  Lab 02/10/18 0544  WBC 8.4  HGB 10.1*  HCT 32.2*  PLT 203    Chemistries  Recent Labs  Lab 02/08/18 1843  02/10/18 0544  NA  --    < > 146*  K  --    < > 3.4*  CL  --    < > 108  CO2  --    < > 30  GLUCOSE  --    < > 127*  BUN  --    < > 26*  CREATININE  --    < > 0.99  CALCIUM  --    < > 7.9*  AST 23  --   --   ALT 37  --   --   ALKPHOS 56  --   --   BILITOT 0.8  --   --    < > = values in this interval not displayed.    Cardiac Enzymes Recent Labs  Lab 02/08/18 1842  TROPONINI 0.29*    Microbiology Results  Results for orders placed or performed in visit on 10/19/15  Wound culture     Status: Abnormal   Collection Time: 10/19/15 12:00  AM  Result Value Ref Range Status   Aerobic  Bacterial Culture Final report (A)  Final   Organism ID, Bacteria Staphylococcus aureus (A)  Final    Comment: Heavy growth Most isolates of Staphylococcus sp. produce a beta-lactamase enzyme rendering them resistant to penicillin. Please contact the laboratory if penicillin is being considered for therapy. Based on susceptibility to oxacillin this isolate would be susceptible to: * Beta-lactam/beta-lactamase inhibitor combinations; such as:     Amoxicillin-clavulanic acid     Ampicillin-sulbactam * Antistaphylococcal cephems; such as:     Cefaclor     Cefuroxime * Antistaphylococcal carbapenems; such as:     Imipenem     Meropenem    Antimicrobial Susceptibility Comment  Final    Comment:       ** S = Susceptible; I = Intermediate; R = Resistant **                    P = Positive; N = Negative             MICS are expressed in micrograms per mL    Antibiotic                 RSLT#1    RSLT#2    RSLT#3    RSLT#4 Ciprofloxacin                  R Clindamycin                    R Erythromycin                   R Gentamicin                     S Levofloxacin                   I Linezolid                      S Moxifloxacin                   I Oxacillin                      S Quinupristin/Dalfopristin      S Rifampin                       S Tetracycline                   S Trimethoprim/Sulfa             S Vancomycin                     S     RADIOLOGY:  Dg Chest 2 View  Result Date: 02/08/2018 CLINICAL DATA:  SOB with exertion, aches in left arm. Hx of COPD, diabetes, HTN. Nonsmoker. EXAM: CHEST - 2 VIEW COMPARISON:  12/07/2017 FINDINGS: The heart is enlarged. There is mild pulmonary vascular congestion. No overt edema. No focal consolidations or pleural effusions. IMPRESSION: Cardiomegaly and pulmonary vascular congestion. Electronically Signed   By: Norva Pavlov M.D.   On: 02/08/2018 19:14    EKG:   Orders placed or performed during the hospital encounter of 02/08/18  .  EKG 12-Lead  . EKG 12-Lead  . ED EKG  . ED EKG  . Repeat EKG  . Repeat EKG  Management plans discussed with the patient, family and they are in agreement.  CODE STATUS:     Code Status Orders  (From admission, onward)        Start     Ordered   02/08/18 2252  Do not attempt resuscitation (DNR)  Continuous    Question Answer Comment  In the event of cardiac or respiratory ARREST Do not call a "code blue"   In the event of cardiac or respiratory ARREST Do not perform Intubation, CPR, defibrillation or ACLS   In the event of cardiac or respiratory ARREST Use medication by any route, position, wound care, and other measures to relive pain and suffering. May use oxygen, suction and manual treatment of airway obstruction as needed for comfort.      02/08/18 2252    Code Status History    Date Active Date Inactive Code Status Order ID Comments User Context   12/07/2017 14:59 12/13/2017 20:25 DNR [id number]  Houston Siren, MD Inpatient   12/07/2017 14:46 12/07/2017 14:59 Full Code [id number]  Houston Siren, MD Inpatient   03/28/2017 15:09 03/30/2017 21:26 DNR [id number]  Adrian Saran, MD Inpatient    Advance Directive Documentation     Most Recent Value  Type of Advance Directive  Healthcare Power of Attorney, Living will  Pre-existing out of facility DNR order (yellow form or pink MOST form)  No data  "MOST" Form in Place?  No data      TOTAL TIME TAKING CARE OF THIS PATIENT: 35 minutes.    Altamese Dilling M.D on 02/10/2018 at 10:50 AM  Between 7am to 6pm - Pager - [phone]  After 6pm go to [email or web address] - Social research officer, government  [facility]  Office  [phone]  CC: Primary care physician; Ellyn Hack, MD   Note: This dictation was prepared with Dragon dictation along with smaller phrase technology. Any transcriptional errors that result from this process are unintentional.

## 2018-02-10 NOTE — Progress Notes (Signed)
Iv and tele removed. Discharge instructions given to pt. DNR given to pt. Prescriptions given to pt. Pt has no further concerns. Pt went home on portable home oxygen.

## 2018-02-12 ENCOUNTER — Ambulatory Visit (INDEPENDENT_AMBULATORY_CARE_PROVIDER_SITE_OTHER): Payer: Medicare Other | Admitting: Podiatry

## 2018-02-12 ENCOUNTER — Telehealth: Payer: Self-pay | Admitting: Family Medicine

## 2018-02-12 ENCOUNTER — Encounter: Payer: Self-pay | Admitting: Podiatry

## 2018-02-12 DIAGNOSIS — L97521 Non-pressure chronic ulcer of other part of left foot limited to breakdown of skin: Secondary | ICD-10-CM

## 2018-02-12 DIAGNOSIS — M79676 Pain in unspecified toe(s): Secondary | ICD-10-CM | POA: Diagnosis not present

## 2018-02-12 DIAGNOSIS — B351 Tinea unguium: Secondary | ICD-10-CM

## 2018-02-12 DIAGNOSIS — E1142 Type 2 diabetes mellitus with diabetic polyneuropathy: Secondary | ICD-10-CM | POA: Diagnosis not present

## 2018-02-12 DIAGNOSIS — E11621 Type 2 diabetes mellitus with foot ulcer: Secondary | ICD-10-CM

## 2018-02-12 DIAGNOSIS — L03031 Cellulitis of right toe: Secondary | ICD-10-CM

## 2018-02-12 MED ORDER — CEPHALEXIN 500 MG PO CAPS: 500.0000 mg | ORAL_CAPSULE | Freq: Two times a day (BID) | ORAL | 0 refills | Status: DC

## 2018-02-12 NOTE — Progress Notes (Signed)
This patient presents to the office for preventative foot care services.  This patient is a patient of Dr. Al CorpusHyatt.  This patient has just been released from the hospital due to cardiac heart failure.  She presents the office today with her son for treatment on her feet. The son says that he noticed she had a skin lesion on the tip of the fourth toe left foot.  He says he just noticed the skin lesion when she was in the hospital.  He has provided no self treatment nor sought any professional help.  She also states that she is having pain and discomfort to the fifth toe of the right foot.  She says she has no drainage but only pain noted at the fifth toenail right foot.  This patient states that she is a diabetic on insulin.  She is also taking Plavix due to atrial fibrillation.  She presents the office today for evaluation and treatment of her feet.  General Appearance  Alert, conversant and in no acute stress.  Vascular  Dorsalis pedis and posterior tibial  pulses are weakly  palpable  bilaterally.  Capillary return is within normal limits  bilaterally. Temperature is diminished  bilaterally.  Neurologic  Senn-Weinstein monofilament wire test diminished   bilaterally. Muscle power within normal limits bilaterally.  Nails Thick disfigured discolored nails with subungual debris  from hallux to fifth toes bilaterally. No evidence of bacterial infection or drainage bilaterally. Pain and redness noted at the proximal nail fold of the fifth toenail right foot.    Orthopedic  No limitations of motion  feet .  No crepitus or effusions noted.  No bony pathology or digital deformities noted.  Skin  normotropic skin with no porokeratosis noted bilaterally.  No signs of infections or ulcers noted. Lister corn noted on the fifth toe of the right foot. Skin lesion noted at the tip of the fourth toe of the left foot. No evidence of any redness, swelling or infection.  Onychomycosis  B/L  Diabetic ulcer fourth toe  left foot.  Abscess noted under Lister corn fifth toe right foot.   ROV>  Debridement of nails  X 10.   Incision and drainage fifth toe right foot revealing pus.  Neosporin/DSD.  Debridement of necrotic tissue at the distal aspect fourth toe revealing 4 x 4 mm.    No signs of redness or infection.  Neosporin/DSD.  RTC 10 days.  Patient will be evaluated for her infection and her ulcer.  This they have healed and resolved nicely, I will consider recommending diabetic shoes for this patient   Helane GuntherGregory Myranda Pavone DPM

## 2018-02-12 NOTE — Telephone Encounter (Signed)
Copied from CRM [id number]. Topic: Quick Communication - See Telephone Encounter >> Feb 12, 2018  9:24 AM Diana EvesHoyt, Maryann B wrote: CRM for notification. See Telephone encounter for:  Pt went to ER and was admitted 02/08/18 for congested heart failure and came on Saturday. She is wanting to make the office aware.  02/12/18.

## 2018-02-12 NOTE — Telephone Encounter (Signed)
I'm so glad that she got checked out Can you please do a TCM note?

## 2018-02-12 NOTE — Telephone Encounter (Signed)
TOC #1. Called pt to f/u [facility]. At the time of this entry, pt did not have a f/u appt scheduled with PCP. Discharge planning includes the following:  - Continue O2, nebulizer treatments and steroid therapy - Continue Digoxin and anticoagulation - monitor daily weights - change Humulin 70/30 - Stop Santyl, Delsym and Benadryl - Continue cardiac diet - [facility] - f/u with Dr. Juliann Paresallwood in 1 week - f/u with PCP in 2 weeks [facility], I am also wanting to discuss/review the above information with the pt to ensure all of the above have been taken care of. Daughter states pt is at a podiatry appt and will not return home until after 5:00pm tonight. Advised dtr that I will be in the office tomorrow and LVM requesting returned call. Informed dtr that if I do not hear from the pt before noon tomorrow, I will call to f/u. Dtr expressed appreciation and understanding.

## 2018-02-13 ENCOUNTER — Other Ambulatory Visit: Payer: Self-pay

## 2018-02-13 ENCOUNTER — Telehealth: Payer: Self-pay

## 2018-02-13 ENCOUNTER — Other Ambulatory Visit: Payer: Self-pay | Admitting: Family Medicine

## 2018-02-13 DIAGNOSIS — Z0000 Encounter for general adult medical examination without abnormal findings: Secondary | ICD-10-CM

## 2018-02-13 NOTE — Telephone Encounter (Signed)
Caller Name: Karen [name], daughter in law Phone: [phone]  Angie talked to Ammie yesterday and was trying to get in touch with pt. Pt informed Angie she was contacted today about appts, transportation, cardiology, etc. Pt was very confused. Please call Angie as she transports pt when needed to appts, etc.

## 2018-02-13 NOTE — Progress Notes (Signed)
C3 care team for assistance

## 2018-02-13 NOTE — Telephone Encounter (Signed)
Transition Care Management Follow-Up Telephone Call   Date [facility] and where[facility]  How have you been since you were released from the hospital?  States her dyspnea and wheezing have not improved. Confirmed she is still on O2, adherent to her nebulizer regimen and taking her prednisone as prescribed. Advised, if she felt her symptoms have not improved, to proceed to the ED for further evaluation. Pt adamantly declined my recommendation. States she does not feel her symptoms are bad enough to go back to the ED.   Inquired if she has been monitoring her weight since discharge as an increase in weight could indicate symptoms are worsening and in need of further evaluation. Pt states she does not have scales to weight herself. Advised I will send in a [employer] referral for a scale to assist with monitoring her weight loss/gain. However, strongly recommended she proceed to ED for further evaluation of her symptoms. Pt still declined my offer.  Pt further states she does have trouble managing her medications. States she doesn't always have assistance from her family and often forgets what she needs to take and when. Further states that her shortness of breath makes it difficult to manage her ADL's. Advised she may benefit from Home Health nursing to manage medications and to assist with ADL's.  Pt further expressed concerns with transportation to and from appts. States her family isn't always available to transport to appts. Reassured pt that I will send in a referral to my [employer] for assistance with transportation to appts. At the time of this entry, pt confirmed she had not yet scheduled an appt with Dr. Juliann Paresallwood and Dr. Sherie DonLada. Declined my offer to assist with scheduling these appts. States she is currently working out transportation arrangements with her son and daughter in-law BEFORE scheduling these appts. Education provided re: the importance of these appts but still declined my  request to assist with scheduling. Advised I will still send the referral for transportation arrangements to help with future appts. Expressed appreciation.   Any patient concerns? See above entry  Items Reviewed: Verified = V   Meds: V - Referral sent to [employer] for assistance with medication management  Allergies: V  Dietary Orders: Cardiac diet. Verbalized acceptance and understanding of dietary limitations/restrictions  Functional Questionnaire:  Independent = I Dependent = D  ADLs: I; but is in need of assistance due to dyspnea and wheezing  Dressing-  I; but is in need of assistance due to dyspnea and wheezing   Eating- I  Maintaining continence- I; but is in need of assistance due to dyspnea and wheezing  Transferring- I; but is in need of assistance due to dyspnea and wheezing  Transportation- I; but is in need of assistance due to family inability to assist at times  Meal Prep- I; but is in need of assistance due to dyspnea and wheezing  Managing Meds- I; but is in need of assistance due to number of medications to manage and often forgetful  Additional Items Reviewed:  Hospital f/u appt confirmed? No. See above entry  If their condition worsens, is the pt aware to call PCP or go to the Emergency Dept.? Yes. Verbalized acceptance and understanding, however, adamantly declined my recommendation to proceed to ED for further evaluation of symptoms.  Was the patient provided with contact information for the PCP's office or ED? Yes.  Was to pt encouraged to call back with questions or concerns? Yes. Expressed understanding and appreciation

## 2018-02-13 NOTE — Telephone Encounter (Signed)
Eversole, Ammie, LPN  Lada, Janit BernMelinda P, MD  Caller: Unspecified (Yesterday, 9:24 AM)        Returned Angie's call to clarify my conversation with the pt earlier today. Angie expressed gratitude for all the assistance (transportation, medication management, HH and a scale for weights) we are providing to the pt to assist with managing her care. Angie states she and her husband have discussed pt's declining medical condition and the care required to manage pt's well being. They both feel the pt would be better served by treatment under Palliative Care. Strongly encouraged Angie to schedule an appt to further discuss this with Dr. Sherie DonLada. Advised, orders for Palliative Care would likely not be signed by Dr. Sherie DonLada at this time. Further explained that this pt has not yet established care with Dr. Sherie DonLada. Angie expressed understanding. States she will discuss with her husband to determine the best day/time for them to attend appt to discuss further with Dr. Sherie DonLada. Angie also states she will call Dr. Glennis Brinkallwood's office to schedule an appt with him as well. Advised Angie that we will proceed with referrals as discussed until further notice. Verbalized appreciation, acceptance and understanding.

## 2018-02-13 NOTE — Telephone Encounter (Signed)
Returned Angie's call to clarify my conversation with the pt earlier today. Angie expressed gratitude for all the assistance (transportation, medication management, HH and a scale for weights) we are providing to the pt to assist with managing her care. Angie states she and her husband have discussed pt's declining medical condition and the care required to manage pt's well being. They both feel the pt would be better served by treatment under Palliative Care. Strongly encouraged Angie to schedule an appt to further discuss this with Dr. Sherie DonLada. Advised, orders for Palliative Care would likely not be signed by Dr. Sherie DonLada at this time. Further explained that this pt has not yet established care with Dr. Sherie DonLada. Angie expressed understanding. States she will discuss with her husband to determine the best day/time for them to attend appt to discuss further with Dr. Sherie DonLada. Angie also states she will call Dr. Glennis Brinkallwood's office to schedule an appt with him as well. Advised Angie that we will proceed with referrals as discussed until further notice. Verbalized appreciation, acceptance and understanding.   Following message received from Rosario JacksKristie Ortiz:  Caller Name: Antonieta Pertngela Kalata, daughter in law Phone: [phone]  [facility] yesterday and was trying to get in touch with pt. Pt informed Angie she was contacted today about appts, transportation, cardiology, etc. Pt was very confused. Please call Angie as she transports pt when needed to appts, etc.

## 2018-02-16 ENCOUNTER — Telehealth: Payer: Self-pay

## 2018-02-16 NOTE — Telephone Encounter (Signed)
Following message was received today from Thomasene RippleAmy Baker, Care Guide with C3 team:  [02/16/2018 2:39 PM]  Baker, Amy:   [facility] to [phone]  so they can monitor BP, Pulse Ox and Weight daily thru blue tooth unit.  They are also awaiting wound nurse to make recommendations on sores on legs and will send request for MD orders when they determine what she needs.  I told her nurse Neysa BonitoChristy Reel [phone] that Dr Sherie DonLada has appt with patient 03/06/18 to establish so Dr Sherie DonLada may want to see her before giving orders but to call and speak to her nurse when they know recommendations.  [facility] and are talking to Mrs. Golden CircleBeale about assistive living and she thought Palliative Care might be helpful.   At the time of this entry, it appears this pt has scheduled an appt with Dr. Sherie DonLada on 03/06/18. Above care needs will be further addressed at this appt.

## 2018-02-20 ENCOUNTER — Ambulatory Visit: Payer: Medicare Other | Admitting: Family

## 2018-02-22 ENCOUNTER — Ambulatory Visit: Payer: Self-pay | Admitting: Family Medicine

## 2018-02-22 ENCOUNTER — Telehealth: Payer: Self-pay | Admitting: Family Medicine

## 2018-02-22 NOTE — Telephone Encounter (Signed)
Ok for orders? Please see below and advise.

## 2018-02-22 NOTE — Telephone Encounter (Signed)
Called [facility] gave ok for orders. Nurse gave verbal understanding.

## 2018-02-22 NOTE — Telephone Encounter (Signed)
Copied from CRM [id number]. Topic: Quick Communication - See Telephone Encounter >> Feb 22, 2018  3:35 PM Guinevere FerrariMorris, Sandia Pfund E, NT wrote: CRM for notification. See Telephone encounter for: 02/22/18. Raoul PitchSierra is calling to get updated verbal orders for wound care. Nurse want's to use PolyMem Max and cover it with abd pad, wrap it with kerlex and secure it with tape and change it two times a week. Pls call back for orders [phone]

## 2018-02-22 NOTE — Telephone Encounter (Signed)
That's fine, but just a reminder that patient MUST be seen within 30 days of the original orders for me to sign off on them Thank you

## 2018-02-27 ENCOUNTER — Encounter (INDEPENDENT_AMBULATORY_CARE_PROVIDER_SITE_OTHER): Payer: Self-pay | Admitting: Vascular Surgery

## 2018-02-27 ENCOUNTER — Ambulatory Visit (INDEPENDENT_AMBULATORY_CARE_PROVIDER_SITE_OTHER): Payer: Medicare Other | Admitting: Vascular Surgery

## 2018-02-27 VITALS — BP 105/71 | HR 99 | Resp 16 | Ht 59.0 in | Wt 178.0 lb

## 2018-02-27 DIAGNOSIS — L97501 Non-pressure chronic ulcer of other part of unspecified foot limited to breakdown of skin: Secondary | ICD-10-CM | POA: Diagnosis not present

## 2018-02-27 DIAGNOSIS — R600 Localized edema: Secondary | ICD-10-CM

## 2018-02-27 DIAGNOSIS — E7800 Pure hypercholesterolemia, unspecified: Secondary | ICD-10-CM | POA: Diagnosis not present

## 2018-02-27 DIAGNOSIS — E118 Type 2 diabetes mellitus with unspecified complications: Secondary | ICD-10-CM | POA: Diagnosis not present

## 2018-02-27 DIAGNOSIS — Z794 Long term (current) use of insulin: Secondary | ICD-10-CM | POA: Diagnosis not present

## 2018-02-27 NOTE — Progress Notes (Signed)
Subjective:    Patient ID: Karen [name], female    DOB: 1941/01/23, 77 y.o.   MRN: [id number] Chief Complaint  Patient presents with  . Follow-up    Ulcers on BLE   Patient presents at the request of her daughter for bilateral lower extremity edema with ulceration and bilateral fourth toe ulcerations.  The patient was last seen in December 2017 and evaluation of bilateral lower extremity edema.  Since that time, the patient has not been engaging in conservative therapy including wearing her medical grade 1 compression stockings or elevating her legs.  The patient presents with her son today.  The patient continues to live at home independently however her family has been seeking possible hospice care due to her end-stage COPD and congestive heart failure.  The patient was recently seen at a podiatrist who did her calluses note located on her bilateral fourth toes.  The callus wound to the right fourth toe is healing.  The callus wound to the left fourth toe is slow to heal.  There is no drainage.  There is no necrotic tissue or foul odor.  The patient has been experiencing progressively worsening edema to the bilateral lower extremity now with blisters weeping a clear fluid to the back of the calves.  The patient denies any bilateral lower extremity pain however she does not ambulate much as she is relatively wheelchair and bedbound.  The patient denies any fever, nausea vomiting.  Review of Systems  Constitutional: Negative.   HENT: Negative.   Eyes: Negative.   Respiratory: Negative.   Cardiovascular: Positive for leg swelling.       Bilateral toe ulceration Bilateral calf ulceration  Gastrointestinal: Negative.   Endocrine: Negative.   Genitourinary: Negative.   Musculoskeletal: Negative.   Skin: Negative.   Allergic/Immunologic: Negative.   Neurological: Negative.   Hematological: Negative.   Psychiatric/Behavioral: Negative.       Objective:   Physical Exam  Constitutional: She  is oriented to person, place, and time. She appears well-developed and well-nourished. No distress.  On oxygen.   HENT:  Head: Normocephalic and atraumatic.  Eyes: Pupils are equal, round, and reactive to light. Conjunctivae are normal.  Neck: Normal range of motion.  Cardiovascular: Normal rate, regular rhythm, normal heart sounds and intact distal pulses.  Pulses:      Radial pulses are 2+ on the right side, and 2+ on the left side.  Hard to palpate pedal pulses however the bilateral feet are warm  Pulmonary/Chest: Effort normal and breath sounds normal.  Musculoskeletal: Normal range of motion. She exhibits edema (Moderate nonpitting edema noted bilaterally).  Neurological: She is alert and oriented to person, place, and time.  Skin: She is not diaphoretic.  Right fourth toe: Her site is almost healed.  There is no erythema or drainage noted.  There is no necrotic tissue. Left fourth toe: Site has a scab on it.  There is no drainage.  There is no surrounding erythema. Right calf: What looks like a blister which has popped draining a clear serous fluid Left calf: Intact blister noted. There is no cellulitis to the bilateral feet or lower extremity.  Psychiatric: She has a normal mood and affect. Her behavior is normal. Judgment and thought content normal.  Vitals reviewed.  BP 105/71 (BP Location: Right Arm, Patient Position: Sitting)   Pulse 99   Resp 16   Ht 4\' 11"  (1.499 m)   Wt 178 lb (80.7 kg)   BMI 35.95 kg/m  Past Medical History:  Diagnosis Date  . COPD (chronic obstructive pulmonary disease) (HCC)   . Diabetes mellitus without complication (HCC)   . Hyperlipidemia   . Hypertension   . Peripheral vascular disease (HCC)   . Ulcers of both lower extremities (HCC)    Social History   Socioeconomic History  . Marital status: Single    Spouse name: Not on file  . Number of children: Not on file  . Years of education: Not on file  . Highest education level: Not on  file  Occupational History  . Not on file  Social Needs  . Financial resource strain: Not on file  . Food insecurity:    Worry: Not on file    Inability: Not on file  . Transportation needs:    Medical: Not on file    Non-medical: Not on file  Tobacco Use  . Smoking status: Never Smoker  . Smokeless tobacco: Never Used  Substance and Sexual Activity  . Alcohol use: No    Alcohol/week: 0.0 oz  . Drug use: No  . Sexual activity: Never  Lifestyle  . Physical activity:    Days per week: Not on file    Minutes per session: Not on file  . Stress: Not on file  Relationships  . Social connections:    Talks on phone: Not on file    Gets together: Not on file    Attends religious service: Not on file    Active member of club or organization: Not on file    Attends meetings of clubs or organizations: Not on file    Relationship status: Not on file  . Intimate partner violence:    Fear of current or ex partner: Not on file    Emotionally abused: Not on file    Physically abused: Not on file    Forced sexual activity: Not on file  Other Topics Concern  . Not on file  Social History Narrative  . Not on file   Past Surgical History:  Procedure Laterality Date  . CHOLECYSTECTOMY  1993   Family History  Problem Relation Age of Onset  . Heart disease Mother   . Heart disease Father   . Heart disease Sister   . Dementia Sister   . Heart disease Brother   . Diabetes Paternal Grandmother    Allergies  Allergen Reactions  . Ace Inhibitors Other (See Comments)    Cough   . Singulair [Montelukast Sodium]       Assessment & Plan:  Patient presents at the request of her daughter for bilateral lower extremity edema with ulceration and bilateral fourth toe ulcerations.  The patient was last seen in December 2017 and evaluation of bilateral lower extremity edema.  Since that time, the patient has not been engaging in conservative therapy including wearing her medical grade 1  compression stockings or elevating her legs.  The patient presents with her son today.  The patient continues to live at home independently however her family has been seeking possible hospice care due to her end-stage COPD and congestive heart failure.  The patient was recently seen at a podiatrist who did her calluses note located on her bilateral fourth toes.  The callus wound to the right fourth toe is healing.  The callus wound to the left fourth toe is slow to heal.  There is no drainage.  There is no necrotic tissue or foul odor.  The patient has been experiencing progressively worsening edema to  the bilateral lower extremity now with blisters weeping a clear fluid to the back of the calves.  The patient denies any bilateral lower extremity pain however she does not ambulate much as she is relatively wheelchair and bedbound.  The patient denies any fever, nausea vomiting.  1. Skin ulcer of toe, limited to breakdown of skin, unspecified laterality (HCC) - Stable Patient with slow healing left fourth toe ulceration after having a callus removed by her podiatrist Patient with multiple risk factors for peripheral artery disease Bring the patient back to undergo an ABI to rule out any contributing peripheral artery disease  - VAS [place] ABI WITH/WO TBI; Future  2. Bilateral lower extremity edema - Stable Patient has been treated for lymphedema exacerbation with ulceration in the past The patient has not continued to wear medical grade 1 compression socks or elevating her legs on a regular basis I recommend bilateral 3 layer zinc oxide Unna wraps changed weekly for approximately 1 month to gain control patient's edema and help heal her ulcerations Once her ulcerations have healed I can transition her into medical grade 1 compression socks I will repeat the patient's lower extremity venous duplex as it is approximately 77 years old to assess for any new/worsening venous disease.  - VAS [place] LOWER EXTREMITY  VENOUS REFLUX; Future  3. Type 2 diabetes mellitus with complication, with long-term current use of insulin (HCC) - Stable Encouraged good control as its slows the progression of atherosclerotic disease  4. Pure hypercholesterolemia - Stable Encouraged good control as its slows the progression of atherosclerotic disease  Current Outpatient Medications on File Prior to Visit  Medication Sig Dispense Refill  . pantoprazole (PROTONIX) 20 MG tablet Take 1 tablet (20 mg total) by mouth daily. 30 tablet 2  . acetaminophen (TYLENOL) 325 MG tablet Take 2 tablets (650 mg total) by mouth every 6 (six) hours as needed for mild pain (or Fever >/= 101). (Patient not taking: Reported on 02/13/2018)    . ALPRAZolam (XANAX) 0.25 MG tablet Take 1 tablet (0.25 mg total) by mouth 2 (two) times daily as needed for anxiety. (Patient not taking: Reported on 02/13/2018) 20 tablet 0  . budesonide (PULMICORT) 0.5 MG/2ML nebulizer solution Take 2 mLs (0.5 mg total) by nebulization 2 (two) times daily. 120 mL 0  . cephALEXin (KEFLEX) 500 MG capsule Take 1 capsule (500 mg total) by mouth 2 (two) times daily. 20 capsule 0  . clopidogrel (PLAVIX) 75 MG tablet TAKE 1 TABLET BY MOUTH ONCE DAILY 90 tablet 3  . digoxin (LANOXIN) 0.125 MG tablet Take 0.125 mg by mouth every other day.     . ferrous sulfate 325 (65 FE) MG EC tablet Take 325 mg by mouth daily.     . furosemide (LASIX) 40 MG tablet Take 40 mg by mouth 2 (two) times daily.     Marland Kitchen glucose blood (TRUE METRIX BLOOD GLUCOSE TEST) test strip Dx E11.21, LON 99 months, check blood sugar three times a day 300 each 5  . HUMULIN 70/30 (70-30) 100 UNIT/ML injection Inject 15 Units into the skin 2 (two) times daily with a meal. Inject 50 units in the skin in the morning and 27 units in the skin in the evening. 10 mL 11  . insulin aspart (NOVOLOG) 100 UNIT/ML injection lunch time coverage only. For sugar 121-150 3 units, 151-200 4 units; 210-250 6 units; 251-300 9 units; 301- 350 12  units; greater than 351 16 units 10 mL 0  . Insulin Pen  Needle (UNIFINE PENTIPS) 31G X 6 MM MISC USE AS DIRECTED 100 each 1  . ipratropium-albuterol (DUONEB) 0.5-2.5 (3) MG/3ML SOLN Take 3 mLs by nebulization every 4 (four) hours. 360 mL 0  . ketoconazole (NIZORAL) 2 % cream Apply 1 application 2 (two) times daily as needed topically for irritation. (Patient not taking: Reported on 02/08/2018) 30 g 0  . levothyroxine (SYNTHROID, LEVOTHROID) 150 MCG tablet TAKE 1 TABLET BY MOUTH ONCE DAILY ON AN EMPTY STOMACH. WAIT 30 MINUTES BEFORE TAKING OTHER MEDS. 90 tablet 0  . polyethylene glycol (MIRALAX / GLYCOLAX) packet Take 17 g by mouth daily. (Patient not taking: Reported on 02/08/2018) 30 each 0  . pravastatin (PRAVACHOL) 40 MG tablet TAKE 1 TABLET BY MOUTH ONCE DAILY 90 tablet 1  . predniSONE (DELTASONE) 20 MG tablet Take 1 tablet (20 mg total) by mouth daily with breakfast. 30 tablet 1  . raloxifene (EVISTA) 60 MG tablet TAKE 1 TABLET BY MOUTH ONCE DAILY 30 tablet 2  . ranitidine (ZANTAC) 150 MG tablet Take 150 mg by mouth 2 (two) times daily.    Marland Kitchen. telmisartan (MICARDIS) 80 MG tablet Take 1 tablet (80 mg total) by mouth daily. 90 tablet 0  . VENTOLIN HFA 108 (90 BASE) MCG/ACT inhaler Inhale 2 puffs into the lungs every 6 (six) hours as needed.     . Vitamin D, Ergocalciferol, (DRISDOL) 50000 units CAPS capsule Take 1 capsule (50,000 Units total) by mouth every 7 (seven) days. (Patient not taking: Reported on 01/08/2018) 12 capsule 0  . vitamin E 400 UNIT capsule Take 400 Units by mouth daily.     No current facility-administered medications on file prior to visit.    There are no Patient Instructions on file for this visit. No follow-ups on file.  KIMBERLY A STEGMAYER, PA-C

## 2018-03-04 ENCOUNTER — Telehealth: Payer: Self-pay | Admitting: Family Medicine

## 2018-03-04 NOTE — Telephone Encounter (Signed)
Please notify home health that I have not seen patient yet She canceled her last appointment with me Per Medicare rules, I need to see her within 30 days of her first order or they may not get paid Please also call patient and urge her to keep her appointment with me on April 2nd; she must keep that appointment for Medicare purposes

## 2018-03-05 NOTE — Telephone Encounter (Signed)
Left voicemail with home health, and also spoke with daughter in law and she states will be here tomorrow.

## 2018-03-06 ENCOUNTER — Ambulatory Visit (INDEPENDENT_AMBULATORY_CARE_PROVIDER_SITE_OTHER): Payer: Medicare Other | Admitting: Family Medicine

## 2018-03-06 ENCOUNTER — Encounter: Payer: Self-pay | Admitting: Family Medicine

## 2018-03-06 DIAGNOSIS — L97521 Non-pressure chronic ulcer of other part of left foot limited to breakdown of skin: Secondary | ICD-10-CM

## 2018-03-06 DIAGNOSIS — R600 Localized edema: Secondary | ICD-10-CM | POA: Diagnosis not present

## 2018-03-06 DIAGNOSIS — IMO0002 Reserved for concepts with insufficient information to code with codable children: Secondary | ICD-10-CM

## 2018-03-06 DIAGNOSIS — E1165 Type 2 diabetes mellitus with hyperglycemia: Secondary | ICD-10-CM | POA: Diagnosis not present

## 2018-03-06 DIAGNOSIS — Z66 Do not resuscitate: Secondary | ICD-10-CM | POA: Diagnosis not present

## 2018-03-06 DIAGNOSIS — J441 Chronic obstructive pulmonary disease with (acute) exacerbation: Secondary | ICD-10-CM

## 2018-03-06 DIAGNOSIS — E1121 Type 2 diabetes mellitus with diabetic nephropathy: Secondary | ICD-10-CM

## 2018-03-06 DIAGNOSIS — I5023 Acute on chronic systolic (congestive) heart failure: Secondary | ICD-10-CM

## 2018-03-06 MED ORDER — PANTOPRAZOLE SODIUM 40 MG PO TBEC: 40.0000 mg | DELAYED_RELEASE_TABLET | Freq: Every day | ORAL | Status: AC

## 2018-03-06 NOTE — Progress Notes (Signed)
BP 132/80 (BP Location: Right Arm, Patient Position: Sitting, Cuff Size: Normal)   Pulse 100   Temp 98.2 F (36.8 C) (Oral)   Ht 4\' 11"  (1.499 m)   Wt 180 lb 11.2 oz (82 kg)   SpO2 95%   BMI 36.50 kg/m    Subjective:    Patient ID: Karen [name], female    DOB: 03/05/1941, 77 y.o.   MRN: [id number]  HPI: Karen [name] is a 77 y.o. female  Chief Complaint  Patient presents with  . Follow-up  . Toe Pain    Had toe nail cut out and removed, seeing wound care   . COPD  . Congestive Heart Failure  . Diabetes    HPI Here to establish care; was seeing Dr. Thana Ates for a long time; then she saw Dr. Sherryll Burger, and he has left our practice  Multiple diagnoses On the 02/08/18 form; reviewed with patient and she agrees the diagnoses match They are interested in the palliative care 3 years ago, family tried to get her to assisted living; then Dec 07, 2017, her oxygen level was in the 70s; he did a direct admit, then hospitalized and went to rehab; she could get up and go to the bathroom by herself so they sent her home Weakness in upper extremities  She is very independent; home health tries to come out and help; some conflict because she likes things done a particular way; Touched by an Lawanna Kobus was coming out; lady was moving stuff, reorganizing her groceries where patient couldn't get to them; they talked to Dr. Sherryll Burger last time and thought about assisted living at that time; current Medicaid is community and doesn't cover care right now She is of sound mind, so son cannot do anything to force her into assisted living Patient can make her own decisions She has an adjustable bed at home; would more comfortable at home than going to assisted living  Wound care, blister on the back of the left leg; going to wound center Was on prednisone taper, long taper but did not gain weight; taking 10 mg currently; Dr. Welton Flakes moved it up to 20 mg daily (pulmonologist)  Dr. Daphane Shepherd looked at the toe on March 11th;  toenail was growing over and it will eventually heal up; he took his scapel for a spot on the right foot  She does not want any scopes, swallow tests Sharp; has cataract on one eye and has to have help with fixing her insulin needles  She has been hospitalized a few times recently; fluid build up around her heart COPD, on supplemental oxygen  Son brought in her blood pressure readings; looks as thought patient is actually tracking these based on hand-writing; April 2nd 138/84, 151/78 on April 1st; lowest was March 27th at 103/60 She is checking her weight too; range has been 173 on March 31st, high was 181 on March 27th and March 28th Depression screen [facility] 2/9 03/06/2018 01/08/2018 12/07/2017 04/05/2017 12/19/2016  Decreased Interest 0 0 0 0 0  Down, Depressed, Hopeless 0 0 0 0 0  PHQ - 2 Score 0 0 0 0 0    Relevant past medical, surgical, family and social history reviewed Past Medical History:  Diagnosis Date  . COPD (chronic obstructive pulmonary disease) (HCC)   . Diabetes mellitus without complication (HCC)   . Hyperlipidemia   . Hypertension   . Peripheral vascular disease (HCC)   . Ulcers of both lower extremities (HCC)  Past Surgical History:  Procedure Laterality Date  . CHOLECYSTECTOMY  1993   Family History  Problem Relation Age of Onset  . Heart disease Mother   . Heart disease Father   . Heart disease Sister   . Dementia Sister   . Heart disease Brother   . Diabetes Paternal Grandmother    Social History   Tobacco Use  . Smoking status: Never Smoker  . Smokeless tobacco: Never Used  Substance Use Topics  . Alcohol use: No    Alcohol/week: 0.0 oz  . Drug use: No    Interim medical history since last visit reviewed. Allergies and medications reviewed  Review of Systems Per HPI unless specifically indicated above     Objective:    BP 132/80 (BP Location: Right Arm, Patient Position: Sitting, Cuff Size: Normal)   Pulse 100   Temp 98.2 F (36.8 C) (Oral)    Ht 4\' 11"  (1.499 m)   Wt 180 lb 11.2 oz (82 kg)   SpO2 95%   BMI 36.50 kg/m   Wt Readings from Last 3 Encounters:  03/06/18 180 lb 11.2 oz (82 kg)  02/27/18 178 lb (80.7 kg)  02/10/18 176 lb 1.6 oz (79.9 kg)    Physical Exam  Constitutional: She appears well-developed and well-nourished. No distress.  Elderly female, seated in wheelchair, appears frail, deconditioned, chronically ill; obese  Eyes: EOM are normal. No scleral icterus.  Neck: No thyromegaly present.  Cardiovascular: Normal rate.  Pulmonary/Chest: Effort normal. She has decreased breath sounds.  Abdominal: She exhibits no distension.  Skin: No pallor.  Stage 2 dec ulcer leg, some weeping  Psychiatric: She has a normal mood and affect. Her mood appears not anxious. She does not exhibit a depressed mood.    Results for orders placed or performed during the hospital encounter of 02/08/18  Basic metabolic panel  Result Value Ref Range   Sodium 141 135 - 145 mmol/L   Potassium 4.0 3.5 - 5.1 mmol/L   Chloride 103 101 - 111 mmol/L   CO2 27 22 - 32 mmol/L   Glucose, Bld 105 (H) 65 - 99 mg/dL   BUN 26 (H) 6 - 20 mg/dL   Creatinine, Ser 1.61 0.44 - 1.00 mg/dL   Calcium 8.5 (L) 8.9 - 10.3 mg/dL   GFR calc non Af Amer >60 >60 mL/min   GFR calc Af Amer >60 >60 mL/min   Anion gap 11 5 - 15  CBC  Result Value Ref Range   WBC 11.5 (H) 3.6 - 11.0 K/uL   RBC 3.93 3.80 - 5.20 MIL/uL   Hemoglobin 11.1 (L) 12.0 - 16.0 g/dL   HCT 09.6 (L) 04.5 - 40.9 %   MCV 88.4 80.0 - 100.0 fL   MCH 28.4 26.0 - 34.0 pg   MCHC 32.1 32.0 - 36.0 g/dL   RDW 81.1 (H) 91.4 - 78.2 %   Platelets 216 150 - 440 K/uL  Urinalysis, Complete w Microscopic  Result Value Ref Range   Color, Urine STRAW (A) YELLOW   APPearance CLEAR (A) CLEAR   Specific Gravity, Urine 1.005 1.005 - 1.030   pH 6.0 5.0 - 8.0   Glucose, UA NEGATIVE NEGATIVE mg/dL   Hgb urine dipstick NEGATIVE NEGATIVE   Bilirubin Urine NEGATIVE NEGATIVE   Ketones, ur NEGATIVE NEGATIVE  mg/dL   Protein, ur NEGATIVE NEGATIVE mg/dL   Nitrite NEGATIVE NEGATIVE   Leukocytes, UA NEGATIVE NEGATIVE   RBC / HPF 0-5 0 - 5 RBC/hpf  WBC, UA 0-5 0 - 5 WBC/hpf   Bacteria, UA NONE SEEN NONE SEEN   Squamous Epithelial / LPF 0-5 (A) NONE SEEN  Troponin I  Result Value Ref Range   Troponin I 0.29 (HH) <0.03 ng/mL  Digoxin level  Result Value Ref Range   Digoxin Level 0.5 (L) 0.8 - 2.0 ng/mL  TSH  Result Value Ref Range   TSH 0.175 (L) 0.350 - 4.500 uIU/mL  T4, free  Result Value Ref Range   Free T4 1.31 (H) 0.61 - 1.12 ng/dL  Hepatic function panel  Result Value Ref Range   Total Protein 6.7 6.5 - 8.1 g/dL   Albumin 3.7 3.5 - 5.0 g/dL   AST 23 15 - 41 U/L   ALT 37 14 - 54 U/L   Alkaline Phosphatase 56 38 - 126 U/L   Total Bilirubin 0.8 0.3 - 1.2 mg/dL   Bilirubin, Direct <1.6<0.1 (L) 0.1 - 0.5 mg/dL   Indirect Bilirubin NOT CALCULATED 0.3 - 0.9 mg/dL  Brain natriuretic peptide  Result Value Ref Range   B Natriuretic Peptide 739.0 (H) 0.0 - 100.0 pg/mL  Glucose, capillary  Result Value Ref Range   Glucose-Capillary 92 65 - 99 mg/dL  Basic metabolic panel  Result Value Ref Range   Sodium 147 (H) 135 - 145 mmol/L   Potassium 3.6 3.5 - 5.1 mmol/L   Chloride 105 101 - 111 mmol/L   CO2 33 (H) 22 - 32 mmol/L   Glucose, Bld 48 (L) 65 - 99 mg/dL   BUN 28 (H) 6 - 20 mg/dL   Creatinine, Ser 1.090.98 0.44 - 1.00 mg/dL   Calcium 8.3 (L) 8.9 - 10.3 mg/dL   GFR calc non Af Amer 55 (L) >60 mL/min   GFR calc Af Amer >60 >60 mL/min   Anion gap 9 5 - 15  CBC  Result Value Ref Range   WBC 10.2 3.6 - 11.0 K/uL   RBC 3.68 (L) 3.80 - 5.20 MIL/uL   Hemoglobin 10.4 (L) 12.0 - 16.0 g/dL   HCT 60.432.7 (L) 54.035.0 - 98.147.0 %   MCV 88.8 80.0 - 100.0 fL   MCH 28.2 26.0 - 34.0 pg   MCHC 31.8 (L) 32.0 - 36.0 g/dL   RDW 19.116.5 (H) 47.811.5 - 29.514.5 %   Platelets 216 150 - 440 K/uL  Glucose, capillary  Result Value Ref Range   Glucose-Capillary 42 (LL) 65 - 99 mg/dL   Comment 1 Notify RN    Comment 2  Document in Chart   Glucose, capillary  Result Value Ref Range   Glucose-Capillary 205 (H) 65 - 99 mg/dL   Comment 1 Notify RN    Comment 2 Document in Chart   Glucose, capillary  Result Value Ref Range   Glucose-Capillary 313 (H) 65 - 99 mg/dL   Comment 1 Notify RN    Comment 2 Document in Chart   Glucose, capillary  Result Value Ref Range   Glucose-Capillary 265 (H) 65 - 99 mg/dL   Comment 1 Notify RN    Comment 2 Document in Chart   CBC  Result Value Ref Range   WBC 8.4 3.6 - 11.0 K/uL   RBC 3.64 (L) 3.80 - 5.20 MIL/uL   Hemoglobin 10.1 (L) 12.0 - 16.0 g/dL   HCT 62.132.2 (L) 30.835.0 - 65.747.0 %   MCV 88.3 80.0 - 100.0 fL   MCH 27.8 26.0 - 34.0 pg   MCHC 31.4 (L) 32.0 - 36.0 g/dL   RDW  16.5 (H) 11.5 - 14.5 %   Platelets 203 150 - 440 K/uL  Basic metabolic panel  Result Value Ref Range   Sodium 146 (H) 135 - 145 mmol/L   Potassium 3.4 (L) 3.5 - 5.1 mmol/L   Chloride 108 101 - 111 mmol/L   CO2 30 22 - 32 mmol/L   Glucose, Bld 127 (H) 65 - 99 mg/dL   BUN 26 (H) 6 - 20 mg/dL   Creatinine, Ser 1.61 0.44 - 1.00 mg/dL   Calcium 7.9 (L) 8.9 - 10.3 mg/dL   GFR calc non Af Amer 54 (L) >60 mL/min   GFR calc Af Amer >60 >60 mL/min   Anion gap 8 5 - 15  Glucose, capillary  Result Value Ref Range   Glucose-Capillary 325 (H) 65 - 99 mg/dL  Glucose, capillary  Result Value Ref Range   Glucose-Capillary 132 (H) 65 - 99 mg/dL  Glucose, capillary  Result Value Ref Range   Glucose-Capillary 216 (H) 65 - 99 mg/dL  ECHOCARDIOGRAM COMPLETE  Result Value Ref Range   Weight 2,835.2 oz   Height 59 in   BP 106/37 mmHg      Assessment & Plan:   Problem List Items Addressed This Visit      Cardiovascular and Mediastinum   CHF exacerbation (HCC)    Refer to palliative care      Relevant Orders   Amb Referral to Palliative Care     Respiratory   COPD (chronic obstructive pulmonary disease) (HCC)    Refer to palliative care      Relevant Orders   Amb Referral to Palliative Care       Endocrine   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)    Using insulin; home health and palliative care      Relevant Orders   Amb Referral to Palliative Care     Musculoskeletal and Integument   Skin ulcer of toe, limited to breakdown of skin (HCC)    Refer to palliative care; continue wound care      Relevant Orders   Amb Referral to Palliative Care     Other   DNR (do not resuscitate)    Discussed with patient and son today      Relevant Orders   Amb Referral to Palliative Care   DNR (Do Not Resuscitate)   Bilateral lower extremity edema    Getting legs wrapped          Follow up plan: Return in about 6 weeks (around 04/17/2018) for follow-up visit with Dr. Sherie Don.  An after-visit summary was printed and given to the patient at check-out.  Please see the patient instructions which may contain other information and recommendations beyond what is mentioned above in the assessment and plan.  Meds ordered this encounter  Medications  . pantoprazole (PROTONIX) 40 MG tablet    Sig: Take 1 tablet (40 mg total) by mouth daily.    Per Dr. Juliann Pares    Orders Placed This Encounter  Procedures  . Amb Referral to Palliative Care  . DNR (Do Not Resuscitate)

## 2018-03-06 NOTE — Assessment & Plan Note (Signed)
Using insulin; home health and palliative care

## 2018-03-06 NOTE — Assessment & Plan Note (Signed)
Refer to palliative care; continue wound care

## 2018-03-06 NOTE — Patient Instructions (Addendum)
Please do contact Dr. Titus MouldMeyer's office to schedule an appointment (his last note said 10 days) Pick up 250 or 500 mcg of vitamin B12 a few days a week I've put in the palliative care consult, so someone should contact you soon Thyroid medicine first in the morning, then other medicine an hour later

## 2018-03-06 NOTE — Assessment & Plan Note (Signed)
Refer to palliative care

## 2018-03-06 NOTE — Assessment & Plan Note (Signed)
Refer to palliative care 

## 2018-03-06 NOTE — Assessment & Plan Note (Signed)
Discussed with patient and son today

## 2018-03-06 NOTE — Assessment & Plan Note (Signed)
Getting legs wrapped

## 2018-03-12 ENCOUNTER — Ambulatory Visit (INDEPENDENT_AMBULATORY_CARE_PROVIDER_SITE_OTHER): Payer: Medicare Other | Admitting: Podiatry

## 2018-03-12 ENCOUNTER — Encounter: Payer: Self-pay | Admitting: Podiatry

## 2018-03-12 VITALS — BP 116/66 | HR 102

## 2018-03-12 DIAGNOSIS — E11621 Type 2 diabetes mellitus with foot ulcer: Secondary | ICD-10-CM | POA: Diagnosis not present

## 2018-03-12 DIAGNOSIS — L97521 Non-pressure chronic ulcer of other part of left foot limited to breakdown of skin: Secondary | ICD-10-CM

## 2018-03-12 DIAGNOSIS — L089 Local infection of the skin and subcutaneous tissue, unspecified: Secondary | ICD-10-CM

## 2018-03-12 MED ORDER — CEPHALEXIN 500 MG PO CAPS
500.0000 mg | ORAL_CAPSULE | Freq: Two times a day (BID) | ORAL | 0 refills | Status: AC
Start: 2018-03-12 — End: ?

## 2018-03-12 NOTE — Progress Notes (Signed)
This patient returns to the office follow-up for diabetic ulcer on the fourth toe of the left foot and an abscess noted under the blisters corn, fifth toe right foot.  She presents to the office today with her son for continued evaluation and treatment. Her son says her fifth toe has been healing and she is not having any pain or discomfort.  He says the fourth toe on the left foot has been draining and has not been healing.  He pulled out his phone to show a picture of her fourth toe that was infected as well as ulcerated on the distal aspect of the fourth toe left foot.  The dorsal aspect of the fourth toe as well as the nailbed has healed and the infection has resolved.  He also says she has been taking cephalexin 500 mg by mouth despite not being able to find the antibiotics called in Epic.  There is still an open skin lesion on the distal aspect of the fourth toe.  There is no evidence of any drainage or infection at this site.  This patient is a diabetic on insulin.  She is also taking Plavix due to atrial fibrillation.  She presents the office today for continued evaluation and treatment of her fifth toe right and fourth toe left.   General Appearance  Alert, conversant and in no acute stress.  Vascular  Dorsalis pedis and posterior tibial  pulses are weakly  palpable  bilaterally.  Capillary return is within normal limits  bilaterally. Temperature is diminished   bilaterally.  Neurologic  Senn-Weinstein monofilament wire test diminished   bilaterally. Muscle power within normal limits bilaterally.  Nails Thick disfigured discolored nails with subungual debris  from hallux to fifth toes bilaterally. No evidence of bacterial infection or drainage bilaterally. , the nail plate that was present on the fourth toe left is absent.  Orthopedic  No limitations of motion of motion feet .  No crepitus or effusions noted.  No bony pathology or digital deformities noted.  Skin  normotropic skin with no  porokeratosis noted bilaterally except fourth toe left foot.  Necrotic tissue is atop the diabetic ulcer fourth toe left foot. The ulcer on the fourth toe measures 5 mm x 5 mm.    No infection is noted in the toe.  The fifth toe has a persistent blisters corn, but there is no infection associated with the corn today.  Diabetic ulcer fourth toe left foot.  Resolved infection fifth toe right foot.  ROV.  The fourth toe is healing except the site of the diabetic ulcer fourth toe left foot .  Debridement of necrotic tissue  was performed.  Neosporin and dry sterile dressing was applied.  Patient was told to continue to perform home soaks and continue bandaging.  I recommended we continue her on cephalexin 500.  She is to return to the office in 10 days for further evaluation and treatment.

## 2018-03-14 ENCOUNTER — Other Ambulatory Visit: Payer: Self-pay | Admitting: Internal Medicine

## 2018-03-15 ENCOUNTER — Other Ambulatory Visit: Payer: Self-pay

## 2018-03-15 ENCOUNTER — Other Ambulatory Visit: Payer: Self-pay | Admitting: Internal Medicine

## 2018-03-15 NOTE — Telephone Encounter (Signed)
Called and notified pt

## 2018-03-15 NOTE — Telephone Encounter (Signed)
We'll ask patient to get her pulmonary medicines from her pulmonologist, Dr. Welton FlakesKhan; thank you

## 2018-03-16 ENCOUNTER — Telehealth (INDEPENDENT_AMBULATORY_CARE_PROVIDER_SITE_OTHER): Payer: Self-pay

## 2018-03-16 NOTE — Telephone Encounter (Signed)
The patient daughter Marylene Land(Angela) called stating that they have not heard from home health for unna boot changes,She stated that the patient unna boot has been off for 1 week but has develop a new blister.The daughter stated [facility] 2 days after her visit from the office because the patient said it was tight.I spoke with [place] [facility] and she stated she would look into it and I was seeing if they could get a nurse there soon for the patient to have unna boots.

## 2018-03-19 ENCOUNTER — Telehealth: Payer: Self-pay | Admitting: Family Medicine

## 2018-03-19 NOTE — Telephone Encounter (Signed)
Copied from CRM [phone]. Topic: Quick Communication - See Telephone Encounter >> Mar 19, 2018  4:32 PM Rudi CocoLathan, Sonya Pucci M, [place] wrote: CRM for notification. See Telephone encounter for: 03/19/18.  Denise from [facility] calling to let Dr. Sherie DonLada know that hospice servcies are needed for pt. And they wanted to make sure that Dr. Sherie DonLada would be the rescinding provider. Angelique BlonderDenise can be reached at [phone]

## 2018-03-20 ENCOUNTER — Other Ambulatory Visit: Payer: Self-pay

## 2018-03-20 ENCOUNTER — Other Ambulatory Visit: Payer: Self-pay | Admitting: Family Medicine

## 2018-03-20 DIAGNOSIS — E039 Hypothyroidism, unspecified: Secondary | ICD-10-CM

## 2018-03-20 MED ORDER — LEVOTHYROXINE SODIUM 137 MCG PO TABS: 137.0000 ug | ORAL_TABLET | Freq: Every day | ORAL | 0 refills | Status: AC

## 2018-03-20 NOTE — Telephone Encounter (Signed)
Lab Results  Component Value Date   TSH 0.175 (L) 02/08/2018   T3TOTAL 76 10/18/2017   T4TOTAL 9.0 10/18/2017   Please have patient decrease her thyroid dose from 150 to 137 mcg daily Recheck TSH in 6 weeks (please ORDER) Ask her to get her pulm medicines from Dr. Welton FlakesKhan

## 2018-03-20 NOTE — Telephone Encounter (Signed)
Copied from CRM #[id number]. [id number]: Quick Communication - Rx Refill/Question >> Mar 20, 2018 12:50 PM Oneal GroutSebastian, Jennifer S wrote: Medication: HUMULIN 70/30 (70-30) 100 UNIT/ML injection Has the patient contacted their pharmacy? Yes.   (Agent: If no, request that the patient contact the pharmacy for the refill.) Preferred Pharmacy (with phone number or street name): [facility] Agent: Please be advised that RX refills may take up to 3 business days. We ask that you follow-up with your pharmacy.

## 2018-03-20 NOTE — Telephone Encounter (Signed)
Please clarify dosing with patient; find out exactly how she is taking her Humulin 70/30 insulin Whoever did her med rec left two very different sets of instructions in her Humulin 70/30 Ask patient what she is taking and correct the instructions before I approve Thank you

## 2018-03-20 NOTE — Telephone Encounter (Signed)
Hospice notified, verbal orders given

## 2018-03-20 NOTE — Telephone Encounter (Signed)
I personally called patient to talk about Hospice She is interested in Hospice She has the leg issues that need to be addressed, they are [facility] about that If [facility] can manage that instead of [facility], she is okay with Hospice doing it all if that is needed for insurance Main dx: end stage COPD

## 2018-03-21 ENCOUNTER — Telehealth: Payer: Self-pay | Admitting: Family Medicine

## 2018-03-21 ENCOUNTER — Other Ambulatory Visit: Payer: Self-pay

## 2018-03-21 MED ORDER — IPRATROPIUM-ALBUTEROL 0.5-2.5 (3) MG/3ML IN SOLN: 3.0000 mL | RESPIRATORY_TRACT | 2 refills | Status: AC

## 2018-03-21 MED ORDER — HUMULIN 70/30 (70-30) 100 UNIT/ML ~~LOC~~ SUSP: SUBCUTANEOUS | 5 refills | Status: DC

## 2018-03-21 NOTE — Telephone Encounter (Signed)
Pt.notified

## 2018-03-21 NOTE — Telephone Encounter (Signed)
Copied from CRM [phone]. Topic: Quick Communication - Rx Refill/Question >> Mar 21, 2018  1:17 PM [name], [name] wrote:  Medication: HUMULIN 70/30 (70-30) 100 UNIT/ML injection 10 mL 11 02/10/2018   Sig - Route: Inject 15 Units into the skin 2 (two) times daily with a meal. Inject 50 units in the skin in the morning and 27 units in the skin in the evening. - Subcutaneous   This was sent 2 times and pt. Is out now   Has the patient contacted their pharmacy? Yes.   (Agent: If no, request that the patient contact the pharmacy for the refill.) Preferred Pharmacy (with phone number or street name):   [facility] [place] [place] - [address] [place] [address] Phone: [phone] Fax: [phone]   Agent: Please be advised that RX refills may take up to 3 business days. We ask that you follow-up with your pharmacy.

## 2018-03-21 NOTE — Telephone Encounter (Signed)
She is taking as it states 50 units in am and 27 units in evening

## 2018-03-22 NOTE — Telephone Encounter (Signed)
Already done, sent to [facility] yesterday.

## 2018-03-23 ENCOUNTER — Telehealth: Payer: Self-pay | Admitting: Family Medicine

## 2018-03-23 ENCOUNTER — Other Ambulatory Visit: Payer: Self-pay

## 2018-03-23 MED ORDER — HUMULIN 70/30 (70-30) 100 UNIT/ML ~~LOC~~ SUSP: SUBCUTANEOUS | 5 refills | Status: AC

## 2018-03-23 NOTE — Telephone Encounter (Signed)
Samantha from [facility] called needing clarification on Ms. Pakula's medication.  Call back # is [phone].

## 2018-03-23 NOTE — Telephone Encounter (Signed)
Pharmacy states she needs new rx sent for 2 vials to last all month so quanity of 20ml because she is having to pay 2 copays in 1 month

## 2018-03-28 ENCOUNTER — Telehealth (INDEPENDENT_AMBULATORY_CARE_PROVIDER_SITE_OTHER): Payer: Self-pay

## 2018-03-28 ENCOUNTER — Telehealth: Payer: Self-pay | Admitting: Family Medicine

## 2018-03-28 NOTE — Telephone Encounter (Signed)
Davana (nurse with HOSPICE) called and stated that the patient is now under their care and that she needs orders as to how you would like to care for the patient leg wounds?  Would you like unna wraps, as this is what the patient is used to and has told the nurse to do, but she needs an order.  If so, I can have Selena BattenKim write one up and I'll fax it over.

## 2018-03-28 NOTE — Telephone Encounter (Signed)
I have not seen the patient since February 27, 2018.  If the three layer of zinc oxide unna wraps to the bilateral lower extremity are controlling her edema and discomfort I would continue that.  They should be changed weekly or more often dependent on drainage.  The patient should try to elevate her legs as much as possible.

## 2018-03-28 NOTE — Telephone Encounter (Signed)
Called the nurse back with Hospice, had to leave her a message with instructions for the continuation of the Unna wraps for the patient. Should she have any questions, she can call the office back.

## 2018-03-28 NOTE — Telephone Encounter (Signed)
Copied from CRM [phone]. Topic: Quick Communication - See Telephone Encounter >> Mar 28, 2018  1:00 PM Oneal GroutSebastian, Jennifer S wrote: CRM for notification. See Telephone encounter for: 03/28/18. Wants to make sure that patient is not on potassium. Please advise

## 2018-03-29 NOTE — Telephone Encounter (Signed)
I do not see potassium on pt's med list is this something she should be taking? Please clarify. Thanks

## 2018-03-29 NOTE — Telephone Encounter (Signed)
I don't see potassium on her list; however, her cardiologist has her on furosemide 40 mg BID I would suggest calling cardiologist to see if patient needs to be on potassium with her furosemide

## 2018-03-30 NOTE — Telephone Encounter (Signed)
Will fax question over to cardiology (receptionist said this was the easiest method to communicate)

## 2018-04-02 ENCOUNTER — Telehealth: Payer: Self-pay | Admitting: Family Medicine

## 2018-04-02 NOTE — Telephone Encounter (Signed)
Copied from CRM [id number]. Topic: General - Other >> Apr 02, 2018 10:03 AM Gerrianne Scale wrote: Reason for CRM: Markus Jarvis Nurse from [facility] [phone] calling stating that the pt is taking furosemide (LASIX) 40 MG tablet without a potassium supplement and want to know if the provider would like to prescribe something for pt

## 2018-04-03 ENCOUNTER — Telehealth (INDEPENDENT_AMBULATORY_CARE_PROVIDER_SITE_OTHER): Payer: Self-pay

## 2018-04-03 NOTE — Telephone Encounter (Signed)
We don't prescribe her lasix (furosemide), so we'll encourage them to reach out to the prescribe (maybe cardiology or nephrology?) Thank you

## 2018-04-03 NOTE — Telephone Encounter (Signed)
Davina called and stated that she had questions about the patient's leg/foot.  I called Ms. Davina back, but there was no answer so I had to leave a message.

## 2018-04-04 ENCOUNTER — Other Ambulatory Visit: Payer: Self-pay

## 2018-04-04 DIAGNOSIS — I10 Essential (primary) hypertension: Secondary | ICD-10-CM

## 2018-04-04 MED ORDER — TELMISARTAN 80 MG PO TABS: 80.0000 mg | ORAL_TABLET | Freq: Every day | ORAL | 0 refills | Status: AC

## 2018-04-04 NOTE — Telephone Encounter (Signed)
Called Markus Jarvis Nurse from [place]. I did not get an answer so I left a vm in regards to the concerns she had about the patient taking Lasix without a potassium supplement. I informed her on what PCP stated and recommended that she reach out to cardiology or nephrology. See patient calls from 04/03/2018.

## 2018-04-17 ENCOUNTER — Other Ambulatory Visit: Payer: Self-pay | Admitting: Nurse Practitioner

## 2018-04-17 MED ORDER — PREDNISONE 20 MG PO TABS: ORAL_TABLET | ORAL | 1 refills | Status: AC

## 2018-04-19 ENCOUNTER — Ambulatory Visit: Payer: Medicare Other | Admitting: Family Medicine

## 2018-05-01 ENCOUNTER — Ambulatory Visit (INDEPENDENT_AMBULATORY_CARE_PROVIDER_SITE_OTHER): Payer: Medicare Other | Admitting: Vascular Surgery

## 2018-05-01 ENCOUNTER — Encounter (INDEPENDENT_AMBULATORY_CARE_PROVIDER_SITE_OTHER)

## 2018-05-18 ENCOUNTER — Other Ambulatory Visit: Payer: Self-pay

## 2018-05-18 DIAGNOSIS — E1165 Type 2 diabetes mellitus with hyperglycemia: Principal | ICD-10-CM

## 2018-05-18 DIAGNOSIS — IMO0002 Reserved for concepts with insufficient information to code with codable children: Secondary | ICD-10-CM

## 2018-05-18 DIAGNOSIS — E1121 Type 2 diabetes mellitus with diabetic nephropathy: Secondary | ICD-10-CM

## 2018-05-21 ENCOUNTER — Telehealth: Payer: Self-pay

## 2018-05-21 MED ORDER — "INSULIN SYRINGE-NEEDLE U-100 31G X 5/16"" 0.3 ML MISC": 5 refills | Status: AC

## 2018-05-21 NOTE — Telephone Encounter (Signed)
Copied from CRM [id number]. Topic: Inquiry >> May 21, 2018  3:45 PM Crist InfanteHarrald, Kathy J wrote: Reason for CRM: [facility] on orders from Dr Sherryll BurgerShah pt, orders from 01/2018 and 02/2018 that need signatures. Olin PiaGerlain states they have faxed twice, need back asap. Please include dr's npi number at the signature If you do nnot have, please call [phone]  Ext [phone]

## 2018-05-22 NOTE — Telephone Encounter (Signed)
I will check box 

## 2018-05-29 ENCOUNTER — Telehealth: Payer: Self-pay

## 2018-05-29 NOTE — Telephone Encounter (Signed)
I'm glad they are going to refax; I don't see anything from before

## 2018-05-29 NOTE — Telephone Encounter (Signed)
Copied from CRM [id number]. Topic: Inquiry >> May 21, 2018  3:45 PM Crist InfanteHarrald, Kathy J wrote: Reason for CRM: [facility] up on orders from Dr Sherryll BurgerShah pt, orders from 01/2018 and 02/2018 that need signatures. Olin PiaGerlain states they have faxed twice, need back asap. Please include dr's npi number at the signature If you do nnot have, please call [phone]  Ext [phone] >> May 29, 2018 12:59 PM Jolayne Hainesaylor, Brittany L wrote: Please advise. She is checking the status of this. She is going to re fax again. Just FYI

## 2018-05-31 ENCOUNTER — Telehealth: Payer: Self-pay | Admitting: Family Medicine

## 2018-05-31 NOTE — Telephone Encounter (Signed)
Copied from CRM [id number]. Topic: Quick Communication - See Telephone Encounter >> May 31, 2018 12:09 PM Burchel, Abbi R wrote: See Telephone encounter for: 05/31/18.  Pt has Wound on left medial leg that is being treated by [facility].  Peggy states that pt has collagenase (SANTYL) ointment, and wants to know if they can apply it to this wound.  Peggy does not have specific orders for this medication.  Please advise.  [facility] [phone]

## 2018-05-31 NOTE — Telephone Encounter (Signed)
Peggy notified.

## 2018-05-31 NOTE — Telephone Encounter (Signed)
I've not prescribed that That must be from vascular or podiatry; have them call the prescribing doctor for orders Thank you

## 2018-06-01 ENCOUNTER — Telehealth (INDEPENDENT_AMBULATORY_CARE_PROVIDER_SITE_OTHER): Payer: Self-pay

## 2018-06-01 NOTE — Telephone Encounter (Signed)
Karen [name] [phone]) from [facility] called stating they have been doing unna boots on the patient and was concerned about a wound on the left leg that is not getting smaller and has black discoloration in the middle and also there is some slough places.The nurse wants to know was it fine to start using santyl on the places or do the patient need to come in the office.I spoke with JD and he advise that the nurse can start using santyl and at this time the patient do not need to come in the office but if anything was to change for the nurse to call the office and notify of changes.I left a message to inform medical advice to the nurse

## 2018-06-04 ENCOUNTER — Ambulatory Visit: Payer: Self-pay

## 2018-06-04 ENCOUNTER — Other Ambulatory Visit: Payer: Self-pay

## 2018-06-04 MED ORDER — CLOPIDOGREL BISULFATE 75 MG PO TABS: 75.0000 mg | ORAL_TABLET | Freq: Every day | ORAL | 1 refills | Status: AC

## 2018-06-04 NOTE — Telephone Encounter (Signed)
[facility] concerned about insulin orders and blood sugar values. Is aware pt. Is on Prednisone.  Would like verification of orders.  Please call Jean RosenthalPeggy Bryant. At [phone] before 5pm. State patient blood sugar   Low in around 60's and 150 mid day , then 250's to 300 in evenings would like a call please.

## 2018-06-04 NOTE — Telephone Encounter (Signed)
Please advise 

## 2018-06-06 MED ORDER — INSULIN ASPART 100 UNIT/ML ~~LOC~~ SOLN: SUBCUTANEOUS | 0 refills | Status: AC

## 2018-06-06 NOTE — Telephone Encounter (Signed)
I spoke with Karen [name] about her sugars Three meals a day; carbs for breakfast Confirmed the 70/30 insulin Not usual over 150 at lunch, dinner time is when it is goes up Recommend four unit Novolog with lunch and call me on Monday with readings and we can go up then if needed

## 2018-06-06 NOTE — Addendum Note (Signed)
Addended by: LADA, Janit BernMELINDA P on: 06/06/2018 04:04 PM   Modules accepted: Orders

## 2018-06-10 IMAGING — CR DG CHEST 2V
1 series · 2 of 2 positions shown · non-contrast
Comparison: Chest x-ray dated 09/22/2015. Chest x-ray dated
08/15/2013

CLINICAL DATA: Pt states she has had cough wheezing and SOB for
months but has gotten worse over the last 2 weeks. Pt has COPD, HTN.
shielded

EXAM:
CHEST  2 VIEW

[Series 1: dg chest 2 view · 0.14mm/px · 2 of 2 slices shown]
[im 1/2]
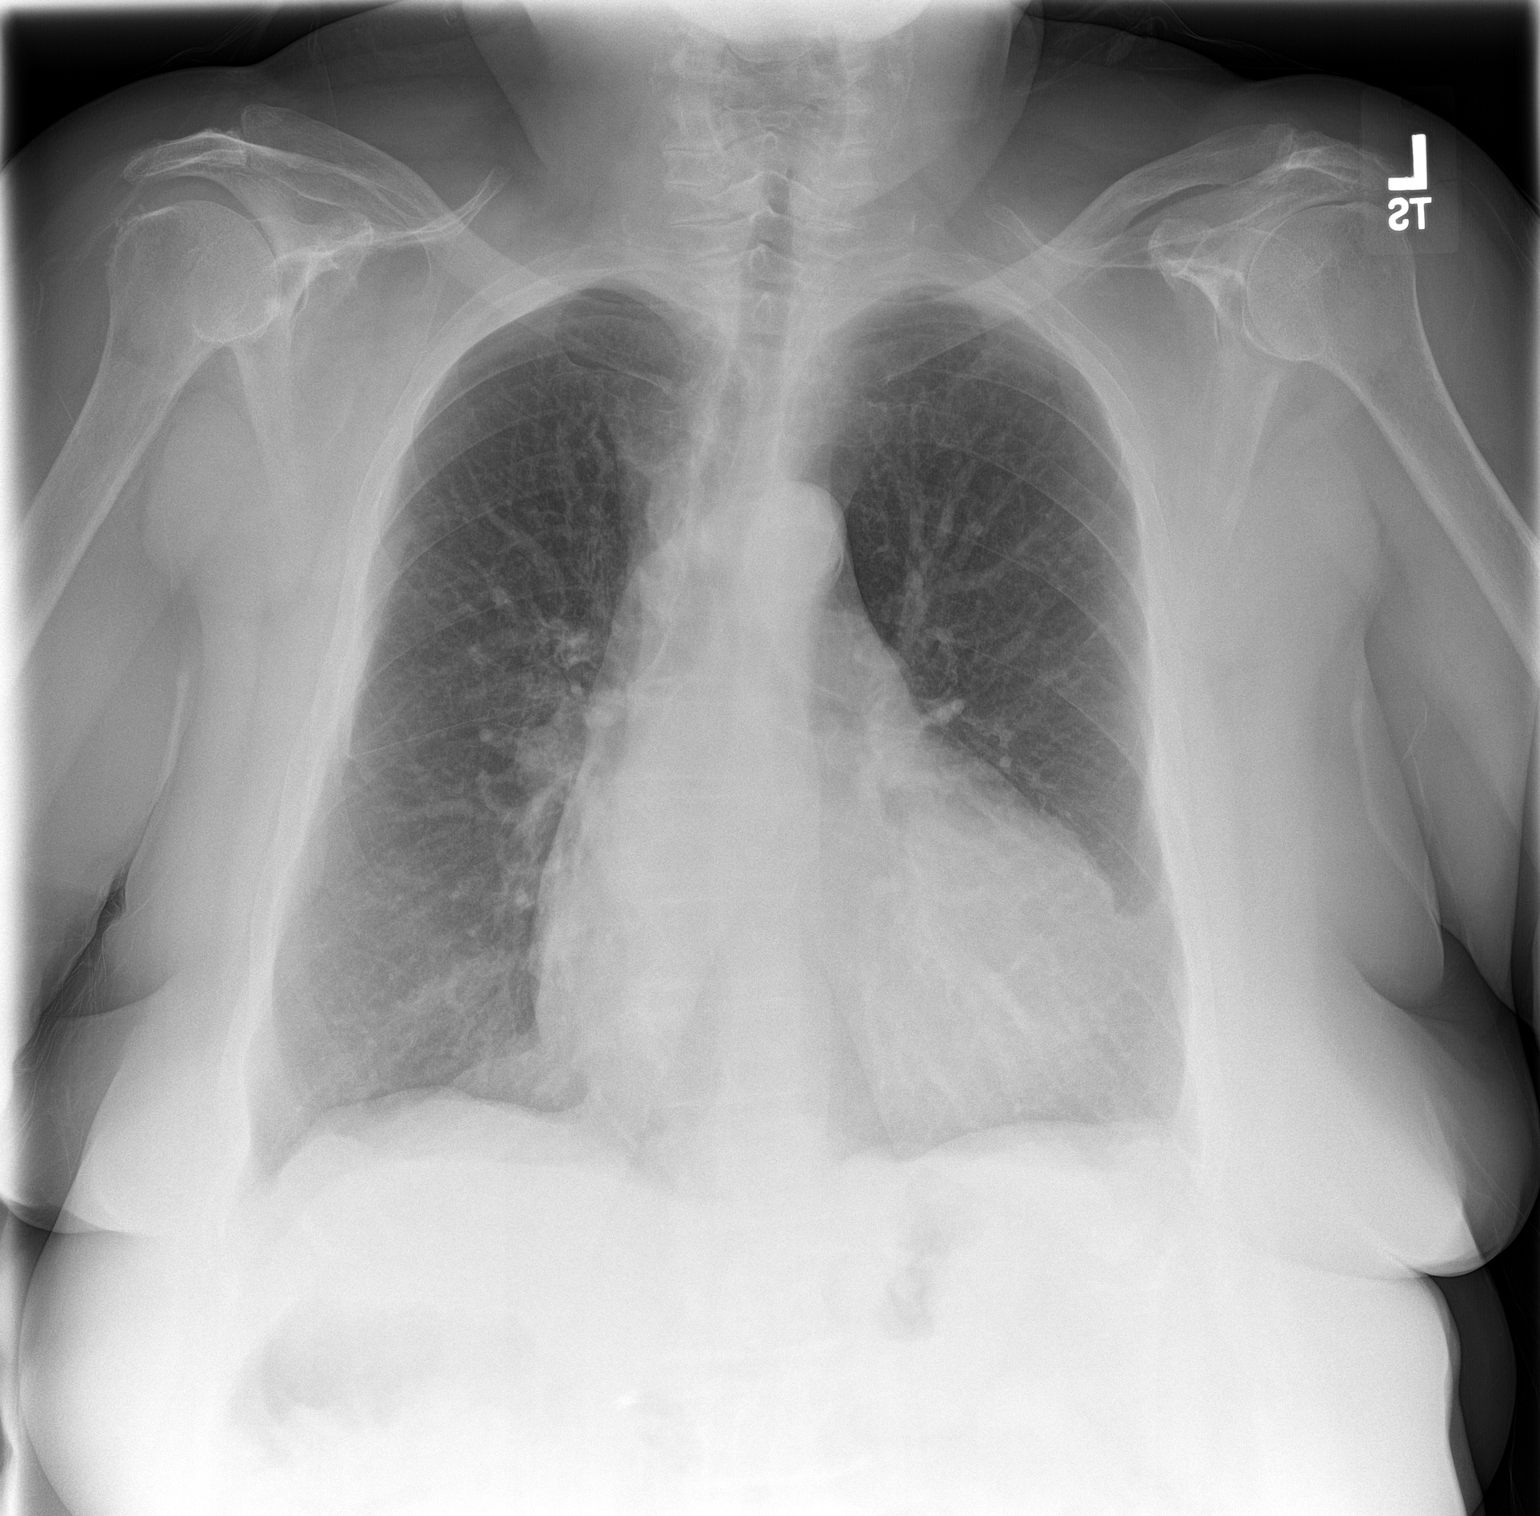
[im 2/2]
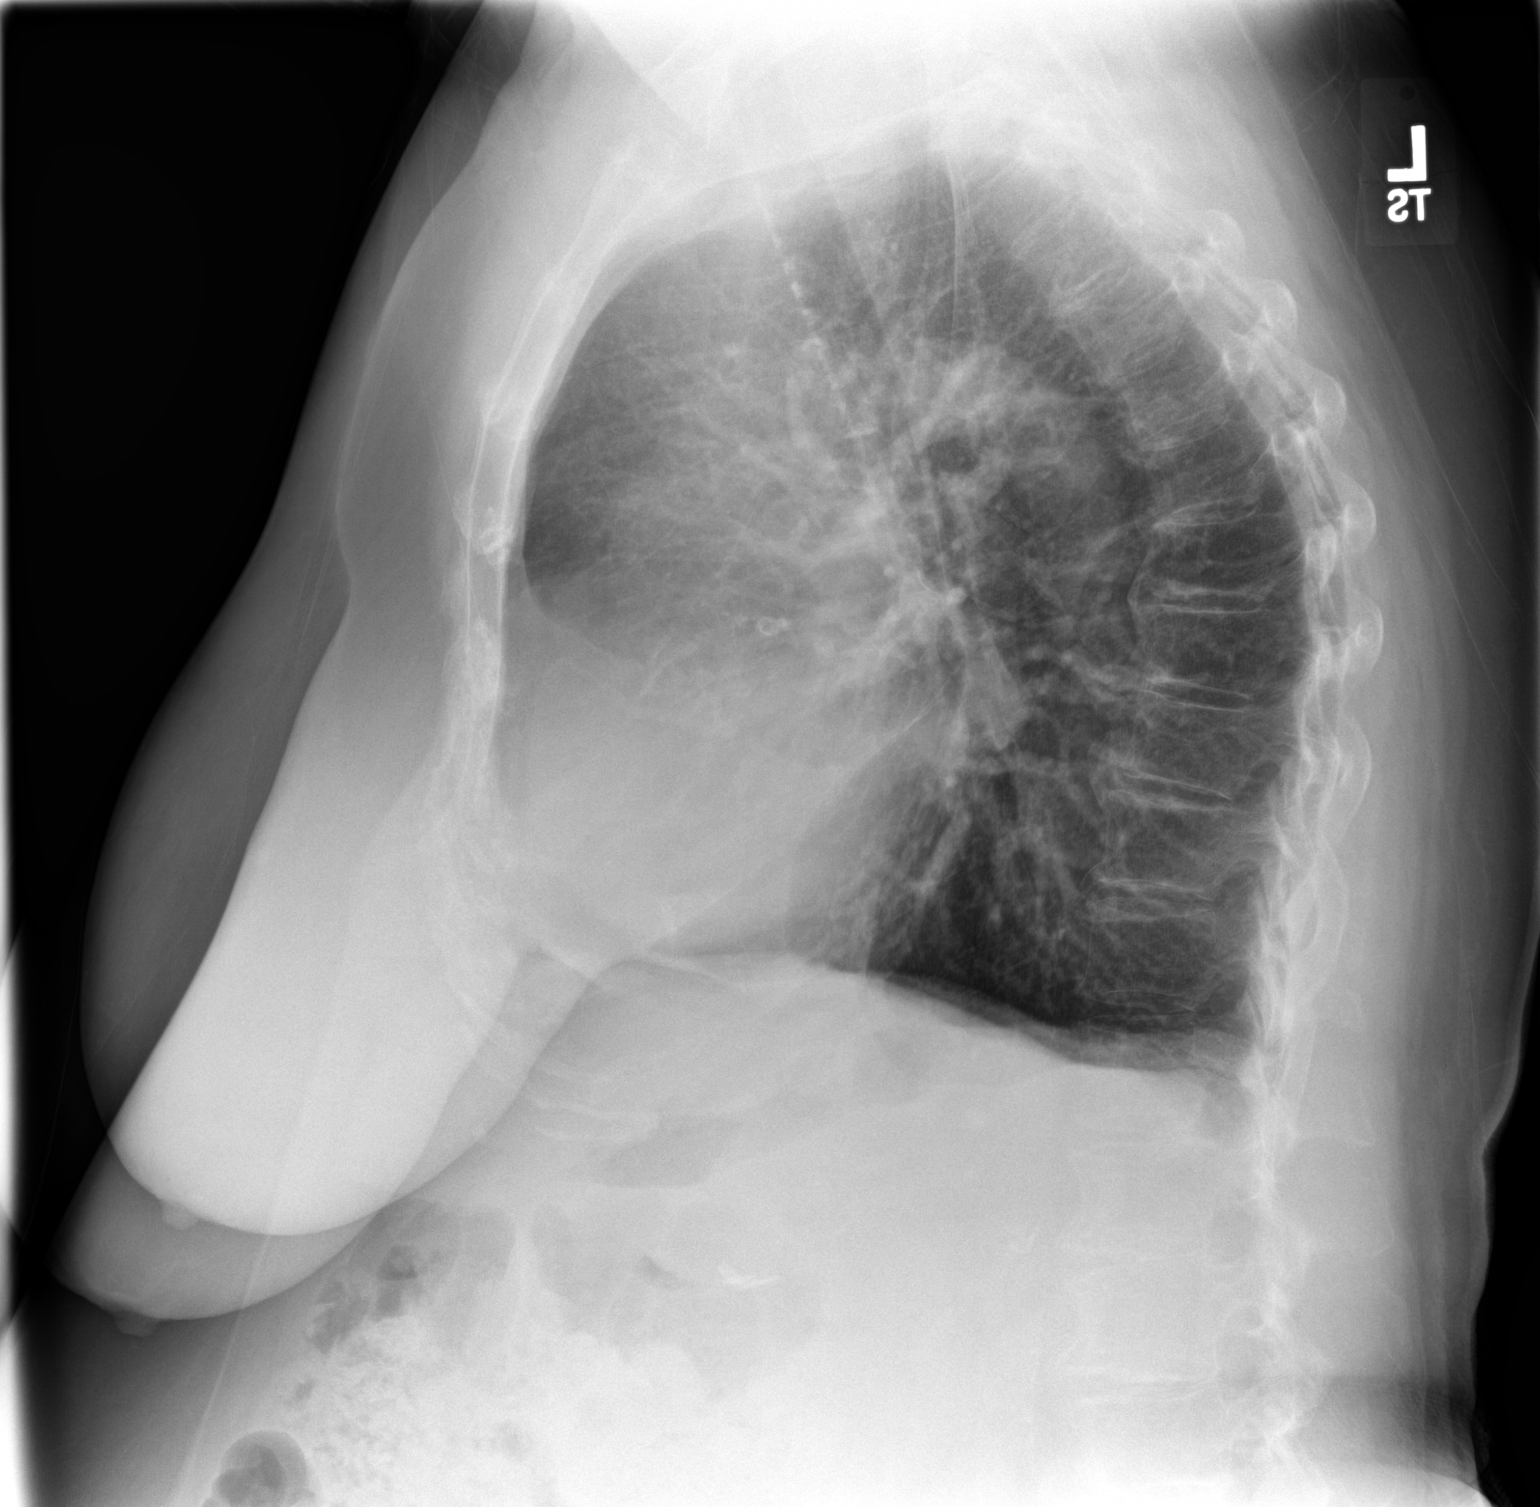

[2 of 2 positions shown; findings below may reference images not displayed]

FINDINGS: Cardiomegaly is stable. Lungs are clear. Lungs again appear
hyperexpanded. No pleural effusion or pneumothorax seen.

Mild degenerative spurring noted throughout the slightly kyphotic
and scoliotic thoracic spine. No acute or significant osseous
finding.
IMPRESSION: 1. Hyperexpanded lungs indicating COPD/emphysema. No evidence of
acute pulmonary abnormality. No evidence of pneumonia or pulmonary
edema.
2. Stable cardiomegaly.  No evidence of CHF.

## 2018-06-14 ENCOUNTER — Ambulatory Visit: Admitting: Podiatry

## 2018-06-18 NOTE — Telephone Encounter (Signed)
[place] with [facility] calling to get an update on the orders being faxed back to them signed. Please advise. CB#: [phone]

## 2018-06-19 ENCOUNTER — Telehealth: Payer: Self-pay

## 2018-06-19 NOTE — Telephone Encounter (Signed)
If she was at an assisted living facility and Dr. Katrinka BlazingSmith was caring for her and writing orders and seeing her, that would be ideal if he could fill that out; thank you

## 2018-06-19 NOTE — Telephone Encounter (Signed)
notified

## 2018-06-19 NOTE — Telephone Encounter (Signed)
Copied from CRM [id number]. Topic: Inquiry >> Jun 19, 2018  3:39 PM Terisa Starraylor, Brittany L wrote: Reason for CRM: [facility] and wants to know will Dr Sherie DonLada be the one to sign her death certificate or would it be Dr Verdis PrimeHenry-Smith with [facility]. He said that he needs to know where he needs to send the death certificate too.  Call back @ [phone]

## 2018-07-05 DEATH — deceased

## 2018-07-17 ENCOUNTER — Telehealth: Payer: Self-pay | Admitting: Family Medicine

## 2018-07-17 NOTE — Telephone Encounter (Signed)
Copied from CRM [id number]. Topic: General - Other >> Jul 17, 2018  8:18 AM Gaynelle AduPoole, Shalonda wrote: Reason for CRM:  Home health care is calling to advise Verbal orders from Feb are needing to be sign by Dr.lada, They have faxed the information over. Please Advise

## 2018-07-17 NOTE — Telephone Encounter (Signed)
Thank you, I will sign if not already done and in my box

## 2018-07-26 NOTE — Telephone Encounter (Signed)
I signed papers on 07/18/18 They are scanned in the chart Please see if that's what they need and resend if they didn't get it That's all they sent me If there is something else they need signed, have them send again

## 2018-07-26 NOTE — Telephone Encounter (Signed)
[facility] called to let pcp know they have not received forms yet; contact if needed or fax forms to [phone]

## 2018-11-15 IMAGING — CR DG SHOULDER 2+V*L*
1 series · 3 of 3 positions shown · non-contrast
Comparison: None.

CLINICAL DATA: Left shoulder pain after fall.

EXAM:
LEFT SHOULDER - 2+ VIEW

[Series 1: dg shoulder left · 0.14mm/px · 3 of 3 slices shown]
[im 1/3]
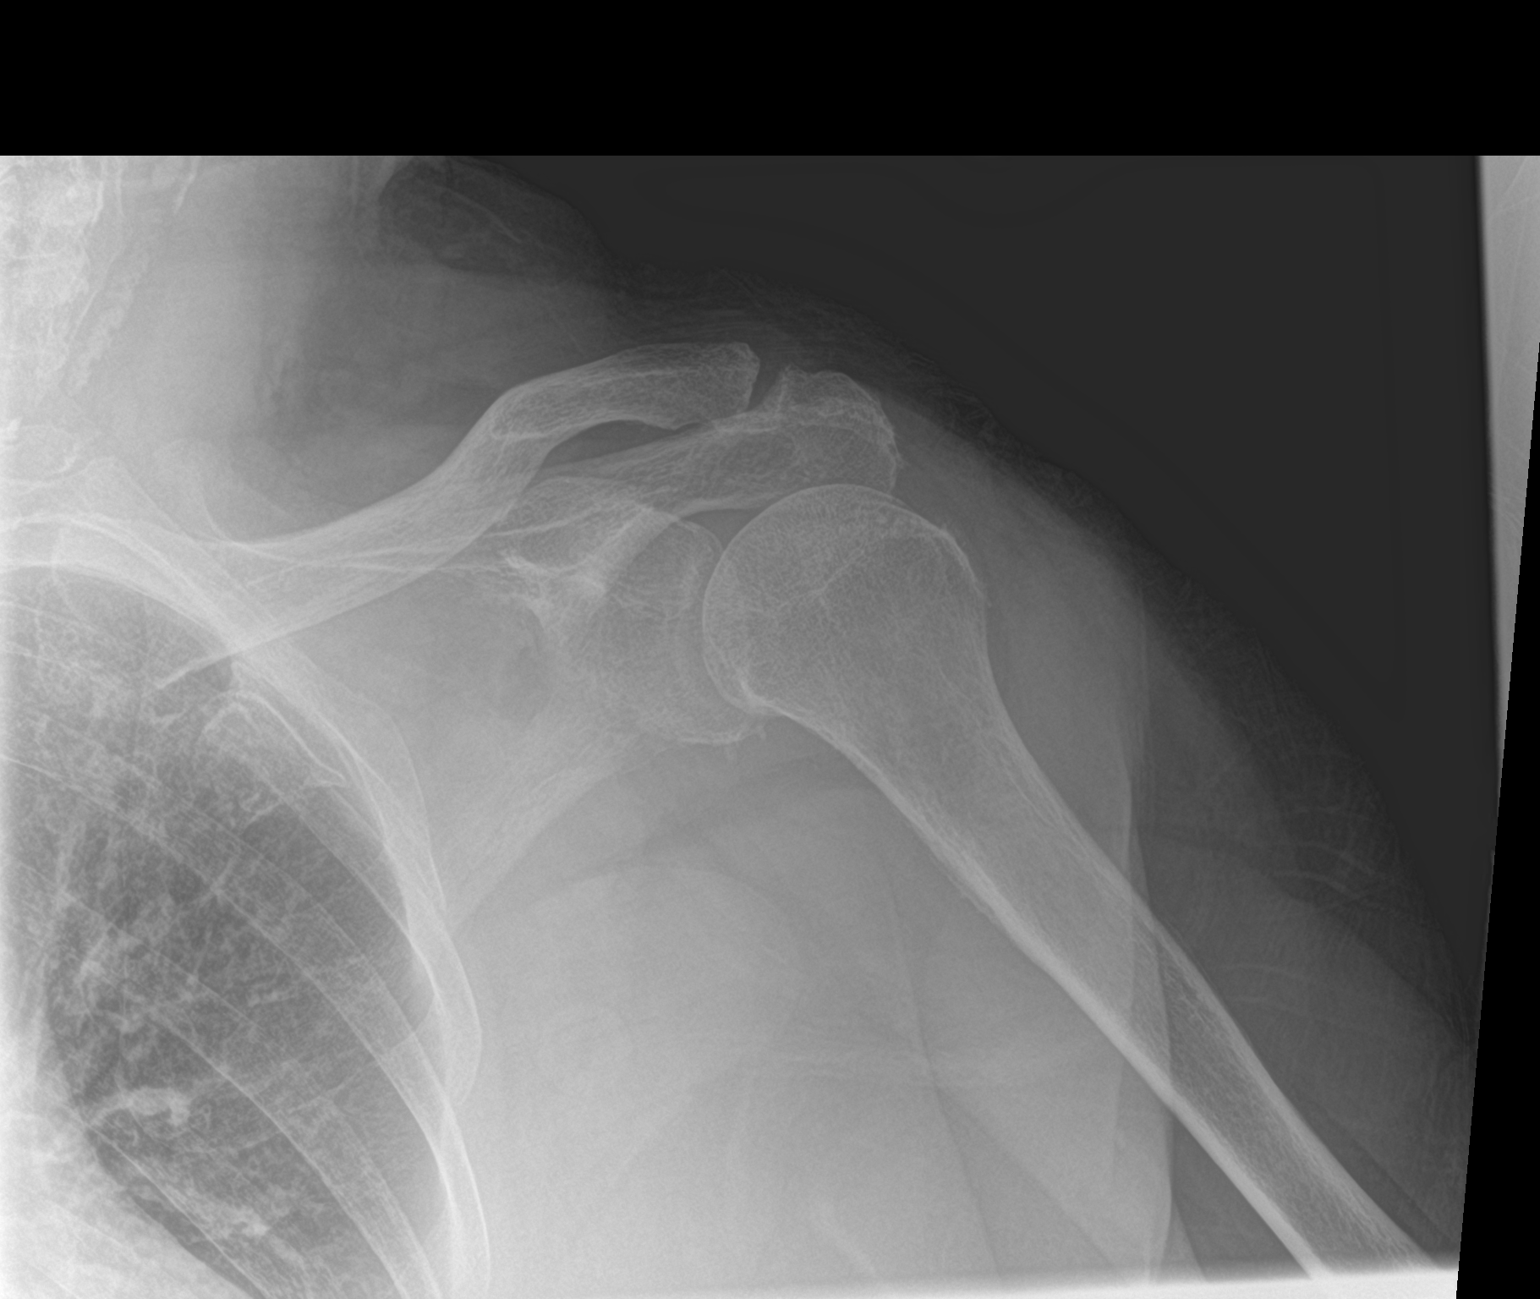
[im 2/3]
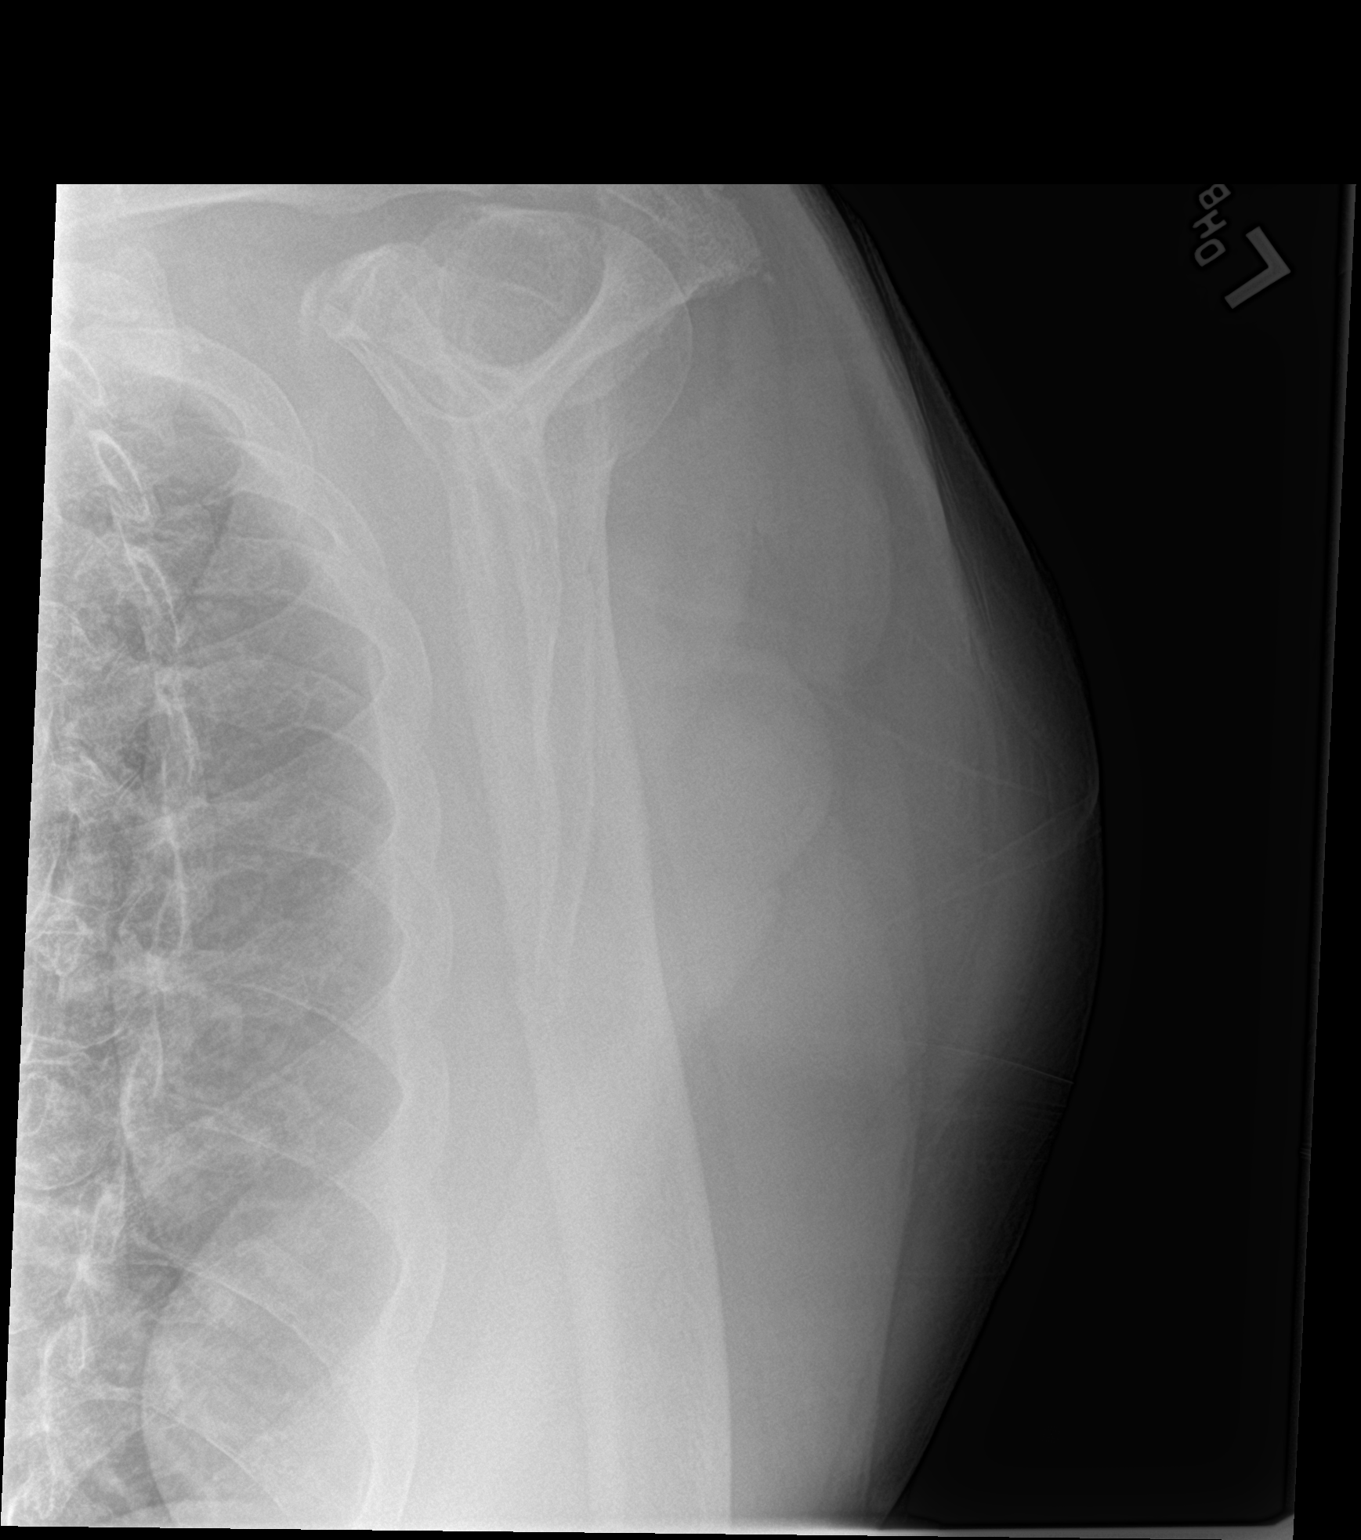
[im 3/3]
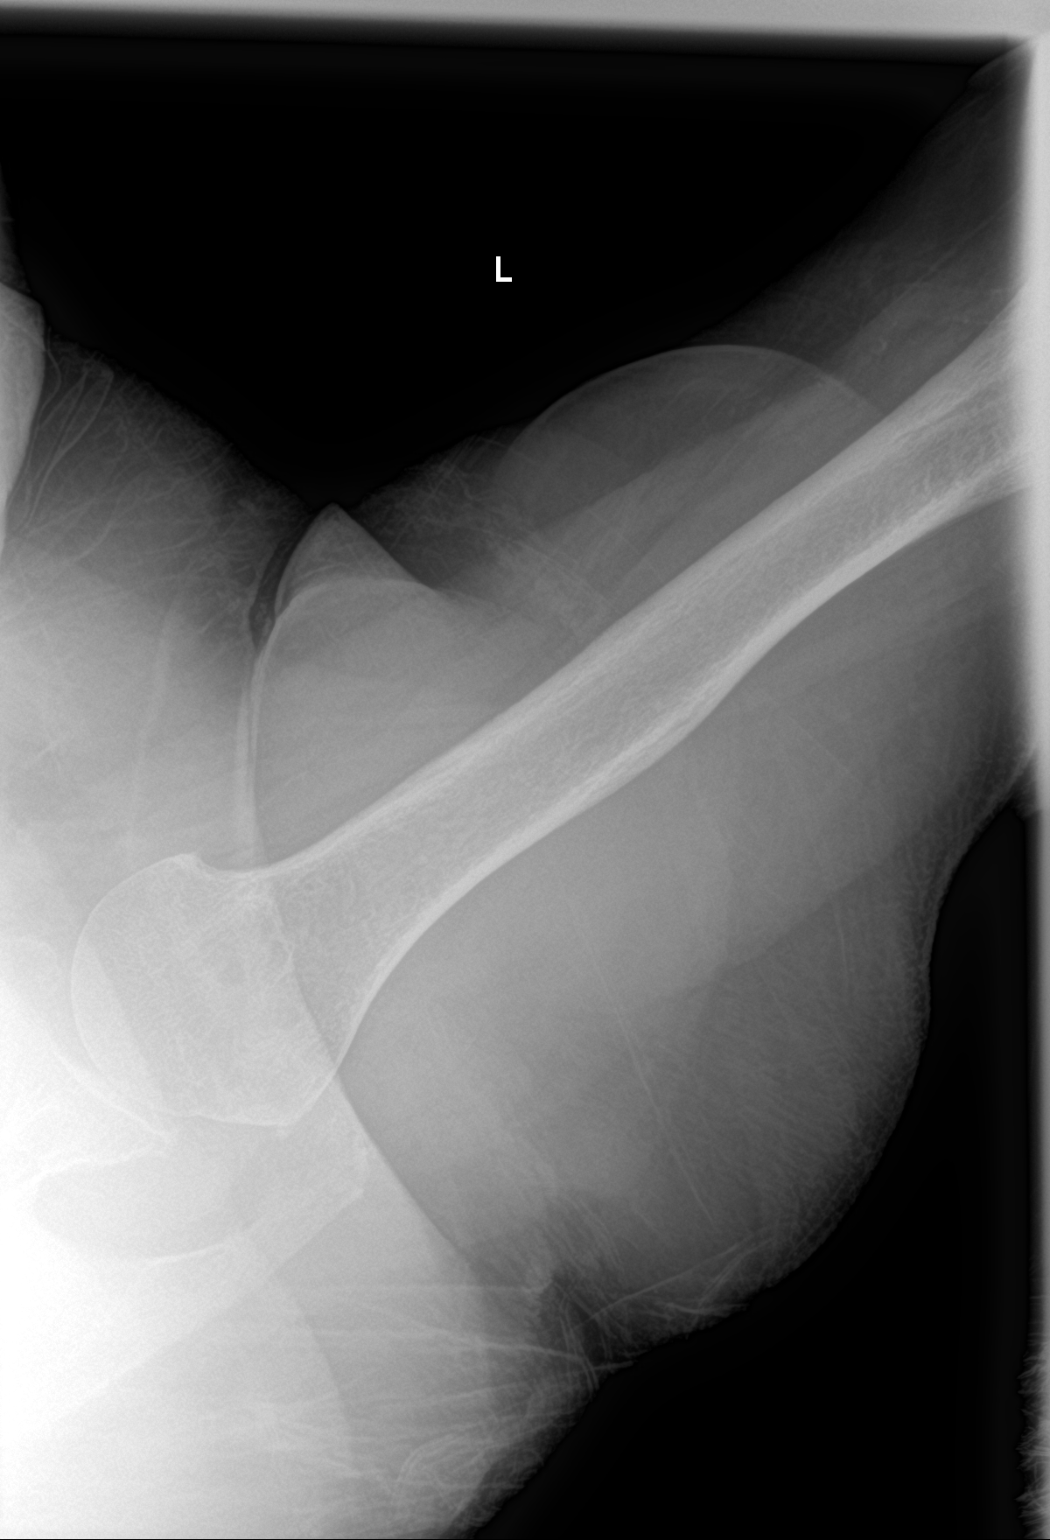

[3 of 3 positions shown; findings below may reference images not displayed]

FINDINGS: There is no evidence of fracture or dislocation. Mild glenohumeral
and acromioclavicular osteoarthritis. Soft tissues are unremarkable.
IMPRESSION: No fracture or dislocation of the left shoulder.
